# Patient Record
Sex: Female | Born: 1940 | Race: White | Hispanic: No | Marital: Married | Smoking: Former smoker
Health system: Southern US, Community
[De-identification: ages and names within clinical notes are randomized; demographics above are authoritative.]

## PROBLEM LIST (undated history)

## (undated) DIAGNOSIS — F429 Obsessive-compulsive disorder, unspecified: Secondary | ICD-10-CM

## (undated) DIAGNOSIS — R296 Repeated falls: Secondary | ICD-10-CM

## (undated) DIAGNOSIS — IMO0002 Reserved for concepts with insufficient information to code with codable children: Secondary | ICD-10-CM

## (undated) DIAGNOSIS — R079 Chest pain, unspecified: Secondary | ICD-10-CM

## (undated) DIAGNOSIS — E119 Type 2 diabetes mellitus without complications: Secondary | ICD-10-CM

## (undated) DIAGNOSIS — K635 Polyp of colon: Secondary | ICD-10-CM

## (undated) DIAGNOSIS — M1990 Unspecified osteoarthritis, unspecified site: Secondary | ICD-10-CM

## (undated) DIAGNOSIS — R413 Other amnesia: Secondary | ICD-10-CM

## (undated) DIAGNOSIS — G4733 Obstructive sleep apnea (adult) (pediatric): Secondary | ICD-10-CM

## (undated) DIAGNOSIS — F329 Major depressive disorder, single episode, unspecified: Secondary | ICD-10-CM

## (undated) DIAGNOSIS — G5600 Carpal tunnel syndrome, unspecified upper limb: Secondary | ICD-10-CM

## (undated) DIAGNOSIS — I779 Disorder of arteries and arterioles, unspecified: Secondary | ICD-10-CM

## (undated) DIAGNOSIS — F419 Anxiety disorder, unspecified: Secondary | ICD-10-CM

## (undated) DIAGNOSIS — R9430 Abnormal result of cardiovascular function study, unspecified: Secondary | ICD-10-CM

## (undated) DIAGNOSIS — I209 Angina pectoris, unspecified: Secondary | ICD-10-CM

## (undated) DIAGNOSIS — I10 Essential (primary) hypertension: Secondary | ICD-10-CM

## (undated) DIAGNOSIS — Z87442 Personal history of urinary calculi: Secondary | ICD-10-CM

## (undated) DIAGNOSIS — F32A Depression, unspecified: Secondary | ICD-10-CM

## (undated) DIAGNOSIS — K219 Gastro-esophageal reflux disease without esophagitis: Secondary | ICD-10-CM

## (undated) DIAGNOSIS — R42 Dizziness and giddiness: Secondary | ICD-10-CM

## (undated) DIAGNOSIS — I739 Peripheral vascular disease, unspecified: Secondary | ICD-10-CM

## (undated) DIAGNOSIS — E7800 Pure hypercholesterolemia, unspecified: Secondary | ICD-10-CM

## (undated) DIAGNOSIS — Z9989 Dependence on other enabling machines and devices: Secondary | ICD-10-CM

## (undated) DIAGNOSIS — R7303 Prediabetes: Secondary | ICD-10-CM

## (undated) HISTORY — DX: Disorder of arteries and arterioles, unspecified: I77.9

## (undated) HISTORY — DX: Obstructive sleep apnea (adult) (pediatric): G47.33

## (undated) HISTORY — PX: KIDNEY STONE SURGERY: SHX686

## (undated) HISTORY — PX: BREAST BIOPSY: SHX20

## (undated) HISTORY — DX: Abnormal result of cardiovascular function study, unspecified: R94.30

## (undated) HISTORY — DX: Chest pain, unspecified: R07.9

## (undated) HISTORY — DX: Dependence on other enabling machines and devices: Z99.89

## (undated) HISTORY — PX: CYSTOSCOPY: SUR368

## (undated) HISTORY — PX: OTHER SURGICAL HISTORY: SHX169

## (undated) HISTORY — DX: Peripheral vascular disease, unspecified: I73.9

## (undated) HISTORY — DX: Type 2 diabetes mellitus without complications: E11.9

## (undated) HISTORY — DX: Reserved for concepts with insufficient information to code with codable children: IMO0002

---

## 1963-03-24 HISTORY — PX: KIDNEY STONE SURGERY: SHX686

## 1992-03-23 HISTORY — PX: NASAL SEPTUM SURGERY: SHX37

## 2000-03-23 HISTORY — PX: TOE SURGERY: SHX1073

## 2003-03-24 HISTORY — PX: BREAST EXCISIONAL BIOPSY: SUR124

## 2005-03-23 HISTORY — PX: CATARACT EXTRACTION: SUR2

## 2007-04-24 ENCOUNTER — Emergency Department (HOSPITAL_COMMUNITY): Admission: EM | Admit: 2007-04-24 | Discharge: 2007-04-24 | Payer: Self-pay | Admitting: Emergency Medicine

## 2008-01-19 ENCOUNTER — Encounter (INDEPENDENT_AMBULATORY_CARE_PROVIDER_SITE_OTHER): Payer: Self-pay | Admitting: *Deleted

## 2008-01-19 ENCOUNTER — Ambulatory Visit (HOSPITAL_COMMUNITY): Admission: RE | Admit: 2008-01-19 | Discharge: 2008-01-19 | Payer: Self-pay | Admitting: *Deleted

## 2008-03-29 ENCOUNTER — Other Ambulatory Visit: Admission: RE | Admit: 2008-03-29 | Discharge: 2008-03-29 | Payer: Self-pay | Admitting: Internal Medicine

## 2009-05-15 ENCOUNTER — Encounter: Admission: RE | Admit: 2009-05-15 | Discharge: 2009-05-15 | Payer: Self-pay | Admitting: Gastroenterology

## 2010-05-08 ENCOUNTER — Other Ambulatory Visit: Payer: Self-pay | Admitting: Obstetrics and Gynecology

## 2010-08-05 NOTE — Op Note (Signed)
NAMEROGELIO, WINBUSH NO.:  [id number]   MEDICAL RECORD NO.:  [id number]          PATIENT TYPE:  AMB   LOCATION:  ENDO                         FACILITY:  [facility]   PHYSICIAN:  Georgiana Spinner, M.D.    DATE OF BIRTH:  [birth year]   DATE OF PROCEDURE:  DATE OF DISCHARGE:                               OPERATIVE REPORT   ADDENDUM:  Previously dictated operative note needs carbon copy sent to:   [facility]  [address], [address], [place]  [address]           ______________________________  Georgiana Spinner, M.D.     GMO/MEDQ  D:  01/19/2008  T:  01/19/2008  Job:  [id number]

## 2010-08-05 NOTE — Op Note (Signed)
NAMECASIA, CORTI        ACCOUNT NO.:  [id number]   MEDICAL RECORD NO.:  [id number]          PATIENT TYPE:  AMB   LOCATION:  ENDO                         FACILITY:  [facility]   PHYSICIAN:  Georgiana Spinner, M.D.    DATE OF BIRTH:  Sep 01, 1940   DATE OF PROCEDURE:  01/19/2008  DATE OF DISCHARGE:                               OPERATIVE REPORT   PROCEDURE:  Colonoscopy.   INDICATIONS:  Colon cancer screening.   ANESTHESIA:  Fentanyl 50 mcg, Versed 5 mg.   PROCEDURE IN DETAIL:  With the patient mildly sedated in the left  lateral decubitus position the Pentax videoscopic colonoscope was  inserted in the rectum and passed under direct vision to the cecum  identified by ileocecal valve and appendiceal orifice both of which were  photographed.  From this point the colonoscope was slowly withdrawn  taking circumferential views of colonic mucosa stopping at approximately  30 cm from the anal verge at which point a polyp was seen, photographed  and removed using hot biopsy forceps technique setting of 20/150 blended  current.  We next stopped just distal to this where a second polyp was  seen.  It too was photographed and it was removed using snare cautery  technique setting of 20/150 blended current as well.  Diverticulosis was  also noted.  The endoscope was withdrawn to the rectum which appeared  normal on direct and showed hemorrhoids on retroflexed view.  The  endoscope was straightened and withdrawn.  The patient's vital signs,  pulse oximeter remained stable.  The patient tolerated the procedure  well without apparent complications.   FINDINGS:  Polyps of sigmoid colon area both placed in one bottle.  Diverticulosis mild of the sigmoid area.  Internal hemorrhoids.   PLAN:  Await biopsy report.  The patient will call me for results and  follow up with me as an outpatient.           ______________________________  Georgiana Spinner, M.D.     GMO/MEDQ  D:  01/19/2008  T:   01/19/2008  Job:  [id number]

## 2011-10-06 ENCOUNTER — Other Ambulatory Visit: Payer: Self-pay | Admitting: Internal Medicine

## 2011-10-06 DIAGNOSIS — I6529 Occlusion and stenosis of unspecified carotid artery: Secondary | ICD-10-CM

## 2011-10-09 ENCOUNTER — Ambulatory Visit
Admission: RE | Admit: 2011-10-09 | Discharge: 2011-10-09 | Disposition: A | Discharge status: Home / Self Care | Payer: Medicare Other | Source: Ambulatory Visit | Attending: Internal Medicine | Admitting: Internal Medicine

## 2011-10-09 DIAGNOSIS — I6529 Occlusion and stenosis of unspecified carotid artery: Secondary | ICD-10-CM

## 2012-01-24 ENCOUNTER — Observation Stay (HOSPITAL_COMMUNITY)
Admission: EM | Admit: 2012-01-24 | Discharge: 2012-01-25 | Disposition: A | Discharge status: Home / Self Care | Payer: Medicare Other | Attending: Cardiovascular Disease | Admitting: Cardiovascular Disease

## 2012-01-24 ENCOUNTER — Emergency Department (HOSPITAL_COMMUNITY): Payer: Medicare Other

## 2012-01-24 ENCOUNTER — Other Ambulatory Visit: Payer: Self-pay

## 2012-01-24 ENCOUNTER — Encounter (HOSPITAL_COMMUNITY): Payer: Self-pay | Admitting: Emergency Medicine

## 2012-01-24 DIAGNOSIS — I779 Disorder of arteries and arterioles, unspecified: Secondary | ICD-10-CM

## 2012-01-24 DIAGNOSIS — E782 Mixed hyperlipidemia: Secondary | ICD-10-CM | POA: Insufficient documentation

## 2012-01-24 DIAGNOSIS — I6529 Occlusion and stenosis of unspecified carotid artery: Secondary | ICD-10-CM | POA: Insufficient documentation

## 2012-01-24 DIAGNOSIS — I10 Essential (primary) hypertension: Secondary | ICD-10-CM

## 2012-01-24 DIAGNOSIS — F429 Obsessive-compulsive disorder, unspecified: Secondary | ICD-10-CM | POA: Insufficient documentation

## 2012-01-24 DIAGNOSIS — E669 Obesity, unspecified: Secondary | ICD-10-CM | POA: Insufficient documentation

## 2012-01-24 DIAGNOSIS — R079 Chest pain, unspecified: Secondary | ICD-10-CM

## 2012-01-24 DIAGNOSIS — I200 Unstable angina: Principal | ICD-10-CM | POA: Insufficient documentation

## 2012-01-24 HISTORY — DX: Pure hypercholesterolemia, unspecified: E78.00

## 2012-01-24 HISTORY — DX: Polyp of colon: K63.5

## 2012-01-24 HISTORY — DX: Obsessive-compulsive disorder, unspecified: F42.9

## 2012-01-24 HISTORY — DX: Essential (primary) hypertension: I10

## 2012-01-24 HISTORY — DX: Unspecified osteoarthritis, unspecified site: M19.90

## 2012-01-24 LAB — TROPONIN I: Troponin I: <0.3 ng/mL (ref <0.30)

## 2012-01-24 LAB — BASIC METABOLIC PANEL
CO2: 30 mEq/L (ref 19–32)
Creatinine, Ser: 0.84 mg/dL (ref 0.50–1.10)
GFR calc Af Amer: 80 mL/min — ABNORMAL LOW (ref >90)
Glucose, Bld: 80 mg/dL (ref 70–99)
Sodium: 138 mEq/L (ref 135–145)

## 2012-01-24 LAB — POCT I-STAT TROPONIN I: Troponin i, poc: 0.01 ng/mL (ref 0.00–0.08)

## 2012-01-24 LAB — CBC
MCH: 33.5 pg (ref 26.0–34.0)
MCHC: 35.3 g/dL (ref 30.0–36.0)
Platelets: 128 10*3/uL — ABNORMAL LOW (ref 150–400)
RBC: 4.57 MIL/uL (ref 3.87–5.11)

## 2012-01-24 LAB — URINALYSIS, ROUTINE W REFLEX MICROSCOPIC
Bilirubin Urine: NEGATIVE
Ketones, ur: NEGATIVE mg/dL

## 2012-01-24 MED ORDER — ATORVASTATIN CALCIUM 20 MG PO TABS
20.0000 mg | ORAL_TABLET | Freq: Every day | ORAL | Status: DC
  Administered 2012-01-24 – 2012-01-25 (×2): 20 mg via ORAL
  Filled 2012-01-24 (×2): qty 1

## 2012-01-24 MED ORDER — ASPIRIN 325 MG PO TABS
325.0000 mg | ORAL_TABLET | Freq: Once | ORAL | Status: AC
  Administered 2012-01-24: 325 mg via ORAL
  Filled 2012-01-24: qty 1

## 2012-01-24 MED ORDER — SODIUM CHLORIDE 0.9 % IJ SOLN: 3.0000 mL | INTRAMUSCULAR | Status: DC | PRN

## 2012-01-24 MED ORDER — METOPROLOL TARTRATE 100 MG PO TABS
100.0000 mg | ORAL_TABLET | Freq: Two times a day (BID) | ORAL | Status: DC
  Administered 2012-01-24 – 2012-01-25 (×2): 100 mg via ORAL
  Filled 2012-01-24 (×3): qty 1

## 2012-01-24 MED ORDER — NITROGLYCERIN 0.4 MG SL SUBL: 0.4000 mg | SUBLINGUAL_TABLET | SUBLINGUAL | Status: DC | PRN

## 2012-01-24 MED ORDER — SODIUM CHLORIDE 0.9 % IV SOLN: 250.0000 mL | INTRAVENOUS | Status: DC | PRN

## 2012-01-24 MED ORDER — CITALOPRAM HYDROBROMIDE 40 MG PO TABS
40.0000 mg | ORAL_TABLET | Freq: Every day | ORAL | Status: DC
  Administered 2012-01-24 – 2012-01-25 (×2): 40 mg via ORAL
  Filled 2012-01-24 (×2): qty 1

## 2012-01-24 MED ORDER — HYDROCHLOROTHIAZIDE 12.5 MG PO CAPS
12.5000 mg | ORAL_CAPSULE | Freq: Every day | ORAL | Status: DC
  Administered 2012-01-25: 12.5 mg via ORAL
  Filled 2012-01-24 (×2): qty 1

## 2012-01-24 MED ORDER — ACETAMINOPHEN 325 MG PO TABS
650.0000 mg | ORAL_TABLET | ORAL | Status: DC | PRN
  Administered 2012-01-25: 650 mg via ORAL

## 2012-01-24 MED ORDER — PANTOPRAZOLE SODIUM 40 MG PO TBEC
40.0000 mg | DELAYED_RELEASE_TABLET | Freq: Every day | ORAL | Status: DC
  Administered 2012-01-25: 40 mg via ORAL
  Filled 2012-01-24 (×2): qty 1

## 2012-01-24 MED ORDER — ASPIRIN 81 MG PO CHEW: 324.0000 mg | CHEWABLE_TABLET | ORAL | Status: DC

## 2012-01-24 MED ORDER — ONDANSETRON HCL 4 MG/2ML IJ SOLN: 4.0000 mg | Freq: Four times a day (QID) | INTRAMUSCULAR | Status: DC | PRN

## 2012-01-24 MED ORDER — ASPIRIN EC 81 MG PO TBEC
81.0000 mg | DELAYED_RELEASE_TABLET | Freq: Every day | ORAL | Status: DC
  Administered 2012-01-24 – 2012-01-25 (×2): 81 mg via ORAL
  Filled 2012-01-24 (×2): qty 1

## 2012-01-24 MED ORDER — ZOLPIDEM TARTRATE 5 MG PO TABS: 5.0000 mg | ORAL_TABLET | Freq: Every evening | ORAL | Status: DC | PRN

## 2012-01-24 MED ORDER — SODIUM CHLORIDE 0.9 % IJ SOLN
3.0000 mL | Freq: Two times a day (BID) | INTRAMUSCULAR | Status: DC
  Administered 2012-01-24 – 2012-01-25 (×2): 3 mL via INTRAVENOUS

## 2012-01-24 MED ORDER — ADULT MULTIVITAMIN W/MINERALS CH
1.0000 | ORAL_TABLET | Freq: Every day | ORAL | Status: DC
  Administered 2012-01-25: 1 via ORAL
  Filled 2012-01-24 (×2): qty 1

## 2012-01-24 MED ORDER — REGADENOSON 0.4 MG/5ML IV SOLN
0.4000 mg | Freq: Once | INTRAVENOUS | Status: AC
  Administered 2012-01-25: 0.4 mg via INTRAVENOUS
  Filled 2012-01-24: qty 5

## 2012-01-24 MED ORDER — ASPIRIN 300 MG RE SUPP
300.0000 mg | RECTAL | Status: DC
  Filled 2012-01-24: qty 1

## 2012-01-24 NOTE — ED Provider Notes (Signed)
History     CSN: [id number]  Arrival date & time 01/24/12  1233   First MD Initiated Contact with Patient 01/24/12 1315      Chief Complaint  Patient presents with  . Chest Pain    (Consider location/radiation/quality/duration/timing/severity/associated sxs/prior treatment) The history is provided by the patient.  Tiffany Burke is a 71 y.o. female history of hypertension, high cholesterol, family history of CAD, here at residing with chest pain. She was at church around 11:15 AM this morning when she developed substernal chest pain radiating to left neck and jaw. She feels alone nauseous but denies any abdominal pain. She has history of reflux but she said this is different than her usual reflux symptoms. She also felt low short of breath but the pain is not pleuritic in nature. She got up after church and just felt "washed out". Denies any syncope. Pain lasted 45 minutes, now resolved. No hx of CAD. Stress test several years ago that was nl.    Past Medical History  Diagnosis Date  . Hypertension     a. Treated x 10-15 yrs  . Hypercholesteremia     a. Treated x 5 yrs   . OCD (obsessive compulsive disorder)   . Colon polyps     No past surgical history on file.  No family history on file.  History  Substance Use Topics  . Smoking status: Never Smoker   . Smokeless tobacco: Not on file  . Alcohol Use: Yes     Comment: occ    OB History    Grav Para Term Preterm Abortions TAB SAB Ect Mult Living                  Review of Systems  Respiratory: Positive for shortness of breath.   Cardiovascular: Positive for chest pain.  All other systems reviewed and are negative.    Allergies  Penicillins and Vancomycin  Home Medications   Current Outpatient Rx  Name  Route  Sig  Dispense  Refill  . ASPIRIN EC 81 MG PO TBEC   Oral   Take 81 mg by mouth daily.         . ATORVASTATIN CALCIUM 20 MG PO TABS   Oral   Take 20 mg by mouth daily.         Marland Kitchen  CITALOPRAM HYDROBROMIDE 40 MG PO TABS   Oral   Take 40 mg by mouth daily.         . ERGOCALCIFEROL 50000 UNITS PO CAPS   Oral   Take 50,000 Units by mouth every 14 (fourteen) days.         Marland Kitchen HYDROCHLOROTHIAZIDE 12.5 MG PO CAPS   Oral   Take 12.5 mg by mouth daily.         Marland Kitchen METOPROLOL TARTRATE 100 MG PO TABS   Oral   Take 100 mg by mouth 2 (two) times daily.         . ADULT MULTIVITAMIN W/MINERALS CH   Oral   Take 1 tablet by mouth daily.         Marland Kitchen OMEPRAZOLE 20 MG PO CPDR   Oral   Take 20 mg by mouth daily.           BP 142/57  Pulse 56  Temp 99.2 F (37.3 C) (Oral)  Resp 16  SpO2 97%  Physical Exam  Nursing note and vitals reviewed. Constitutional: She is oriented to person, place, and time. She appears well-developed and  well-nourished.  HENT:  Head: Normocephalic.  Mouth/Throat: Oropharynx is clear and moist.  Eyes: Conjunctivae normal are normal. Pupils are equal, round, and reactive to light.  Neck: Normal range of motion. Neck supple.  Cardiovascular: Normal rate, regular rhythm and normal heart sounds.   Pulmonary/Chest: Effort normal and breath sounds normal.  Abdominal: Soft. Bowel sounds are normal. She exhibits no distension. There is no tenderness.  Musculoskeletal: Normal range of motion. She exhibits no edema and no tenderness.  Neurological: She is alert and oriented to person, place, and time.  Skin: Skin is warm and dry.  Psychiatric: She has a normal mood and affect. Her behavior is normal. Judgment and thought content normal.    ED Course  Procedures (including critical care time)  Labs Reviewed  CBC - Abnormal; Notable for the following:    Hemoglobin 15.3 (*)     Platelets 128 (*)     All other components within normal limits  BASIC METABOLIC PANEL - Abnormal; Notable for the following:    GFR calc non Af Amer 69 (*)     GFR calc Af Amer 80 (*)     All other components within normal limits  POCT I-STAT TROPONIN I    TROPONIN I   Dg Chest Port 1 View  01/24/2012  *RADIOLOGY REPORT*  Clinical Data: Left-sided chest pain.  PORTABLE CHEST - 1 VIEW  Comparison: None  Findings: Borderline cardiac enlargement.  The mediastinal and hilar contours are within normal limits.  The lungs are clear.  No pleural effusion.  The bony thorax is intact.  IMPRESSION: Borderline cardiac enlargement but no acute pulmonary findings.   Original Report Authenticated By: Rudie Meyer, M.D.      1. Chest pain      Date: 01/24/2012  Rate: 52  Rhythm: sinus tachycardia  QRS Axis: normal  Intervals: normal  ST/T Wave abnormalities: TWI lead III  Conduction Disutrbances:none  Narrative Interpretation:   Old EKG Reviewed: none available    MDM  Tiffany Burke is a 72 y.o. female hx of HTN, HL here with chest pain. Possibly reflux but need to consider ACS. She is unable to run a treadmill and her BMI is 36 so can't get CT coronaries. Will need to consult cardiology for more invasive testing as she has multiple risk factors for ACS.   3:40 PM I discussed with Dr. Elita Boone from cardiology, who will see and admit the patient.         [name], MD 01/24/12 1540

## 2012-01-24 NOTE — H&P (Signed)
Patient ID: Tiffany Burke MRN: [id number], DOB/AGE: March 09, 1941   Admit date: 01/24/2012  Primary Physician: [facility], Dr. Mack Hook Primary Cardiologist: New  Pt. Profile:  71 y/o female w/o prior cardiac hx who presents to the ED w/ chest pain.  Problem List  Past Medical History  Diagnosis Date  . Hypertension     a. Treated x 10-15 yrs  . Hypercholesteremia     a. Treated x 5 yrs   . OCD (obsessive compulsive disorder)   . Colon polyps   . Nephrolithiasis     a. s/p surgical removal bilat in past - 47 yrs ago.  . Osteoarthritis     a. s/p R TKA 2003;  b. s/p L TKA 2007.  . Bilateral carotid artery stenosis     a. 09/2011 Carotid U/S: Eccentric plaque bilat carotid bulbs & origin of bilat ICA's->50-69% on right with borderline elvation of velocities on left.  Degree of narrowing appeared more severe than acquired by velocities.    Past Surgical History  Procedure Date  . Left knee replacement     b. 2007  . Right knee replacement     a. 2003    Allergies  Allergies  Allergen Reactions  . Penicillins Anaphylaxis  . Vancomycin Other (See Comments)    Reaction unknown   HPI  71 y/o female w/o prior cardiac history.  She does not exercise @ home but can usually accomplish most activities w/o experiencing DOE.  Walking up inclines however does cause DOE.  This AM, she was at church and while sitting in the pew, she developed chest heaviness that radiated to her left breast and then up to her neck and bilat jaw/teeth.  This persisted and worsened while she remained at church and so her husband drove her directly to the ED.  Upon arrival here, her discomfort had already subsided spontaneously.  Total duration was approximately 90 mins.  Here, her ECG is non-acute and initial troponin is normal.  She is currently pain free.  Home Medications  Prior to Admission medications   Medication Sig Start Date End Date Taking? Authorizing Provider    aspirin EC 81 MG tablet Take 81 mg by mouth daily.   Yes Historical Provider, MD  atorvastatin (LIPITOR) 20 MG tablet Take 20 mg by mouth daily.   Yes Historical Provider, MD  citalopram (CELEXA) 40 MG tablet Take 40 mg by mouth daily.   Yes Historical Provider, MD  ergocalciferol (VITAMIN D2) 50000 UNITS capsule Take 50,000 Units by mouth every 14 (fourteen) days.   Yes Historical Provider, MD  hydrochlorothiazide (MICROZIDE) 12.5 MG capsule Take 12.5 mg by mouth daily.   Yes Historical Provider, MD  metoprolol (LOPRESSOR) 100 MG tablet Take 100 mg by mouth 2 (two) times daily.   Yes Historical Provider, MD  Multiple Vitamin (MULTIVITAMIN WITH MINERALS) TABS Take 1 tablet by mouth daily.   Yes Historical Provider, MD  omeprazole (PRILOSEC) 20 MG capsule Take 20 mg by mouth daily.   Yes Historical Provider, MD   Family History  Family History  Problem Relation Age of Onset  . Heart attack Mother     died @ 23  . Hypertension Mother   . Stroke Father     died @ 43 - multiple strokes.  . Diabetes Brother     died @ 6 with ESRD  . Other Sister     s/p heart valve replacement  . Other Brother     alive/well @  6   Social History  History   Social History  . Marital Status: Married    Spouse Name: N/A    Number of Children: N/A  . Years of Education: N/A   Occupational History  . Not on file.   Social History Main Topics  . Smoking status: Former Smoker -- 0.5 packs/day for 32 years  . Smokeless tobacco: Not on file     Comment: quit 18 yrs ago.  . Alcohol Use: Yes     Comment: 2-3 glasses/wine per month.  . Drug Use: No  . Sexually Active: Not on file   Other Topics Concern  . 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  Lives in [place] with husband.    Review of Systems General:  No chills, fever, night sweats or weight changes.  Cardiovascular:  +++ chest pain this AM.  H/O dyspnea on exertion with  inclines.  No edema, orthopnea, palpitations, paroxysmal nocturnal dyspnea. Dermatological: No rash, lesions/masses Respiratory: No cough, dyspnea Urologic: No hematuria, dysuria Abdominal:   No nausea, vomiting, diarrhea, bright red blood per rectum, melena, or hematemesis Neurologic:  No visual changes, wkns, changes in mental status. All other systems reviewed and are otherwise negative except as noted above.  Physical Exam  Blood pressure 142/57, pulse 56, temperature 99.2 F (37.3 C), temperature source Oral, resp. rate 16, SpO2 97.00%.  General: Pleasant, NAD Psych: Normal affect. Neuro: Alert and oriented X 3. Moves all extremities spontaneously. HEENT: Normal  Neck: Supple without JVD.  Soft bruit on left. Lungs:  Resp regular and unlabored, CTA. Heart: RRR no s3, s4, or murmurs. Abdomen: Soft, non-tender, non-distended, BS + x 4.  Extremities: No clubbing, cyanosis or edema. DP/PT/Radials 2+ and equal bilaterally.  Labs  Ti: 0.01  Lab Results  Component Value Date   WBC 6.9 01/24/2012   HGB 15.3* 01/24/2012   HCT 43.3 01/24/2012   MCV 94.7 01/24/2012   PLT 128* 01/24/2012     Lab 01/24/12 1306  NA 138  K 4.5  CL 101  CO2 30  BUN 20  CREATININE 0.84  CALCIUM 10.0  PROT --  BILITOT --  ALKPHOS --  ALT --  AST --  GLUCOSE 80   Radiology/Studies  Dg Chest Port 1 View  01/24/2012  *RADIOLOGY REPORT*  Clinical Data: Left-sided chest pain.  PORTABLE CHEST - 1 VIEW  Comparison: None  Findings: Borderline cardiac enlargement.  The mediastinal and hilar contours are within normal limits.  The lungs are clear.  No pleural effusion.  The bony thorax is intact.  IMPRESSION: Borderline cardiac enlargement but no acute pulmonary findings.   Original Report Authenticated By: Rudie Meyer, M.D.    ECG  Sb, 52, ivcd, lad.  ASSESSMENT AND PLAN  1.  Chest Pain:  Pt presented after a 90 minute episode of chest discomfort that resolved spontaneously.  ECG is w/o acute st/t  changes and initial troponin is normal.  RF for CAD include obesity, htn, hl, PVD (carotid dzs), and remote tobacco abuse.  We will place in observation and cycle CE.  If CE remain normal, will plan on lexiscan myoview in the AM.  If however, she rules in, we will plan diagnostic catheterization tomorrow.  Cont asa, bb, statin.  Will not add heparin as she is pain free, but would certainly add if she rules in.  2.  HTN:  Cont home meds and adjust as necessary.  3.  HL:  Cont statin therapy.  This is followed closely as an outpt.  4.  Carotid Bruit:  Had u/s in July.  Cont asa/statin.  5.  OCD:  Cont home dose of celexa.  Signed, Nona Dell, MD, [facility] 01/24/2012, 4:19 PM   Attending note:  Patient seen and examined. Reviewed available records. She has a history of hypertension, hyperlipidemia, and bilateral carotid artery disease based on Doppler assessment. No personal history of CAD or myocardial infarction. She has a brother that had heart disease diagnosed in his 50s, although in the setting of diabetes. She presents after developing chest heaviness while seated in church, ultimately with radiation to her left side and up to her neck and lower jaw. Symptoms lasted for several minutes, ultimately abated prior to presenting to the ER via personal vehicle. She required no specific medication.  At baseline she reports intermittent shortness of breath with activity, present over the last several months. No orthopnea or PND. Has history of bilateral knee surgery, some functional limitation related to this. She reports compliance with her medications and regular followup with her primary care provider.  On examination today she is comfortable without active chest pain. Lungs are clear without labored breathing. She has a left carotid bruit. Cardiac exam reveals regular rate and rhythm without gallop. Initial troponin I normal, creatinine 0.84, hemoglobin 15.3. ECG shows no acute ST segment  abnormalities, incomplete right bundle branch block. Chest x-ray without acute findings.  Patient is being admitted for further evaluation of possible unstable angina symptoms, onset today. Risk factors are noted above including documented atherosclerosis. Cycle cardiac markers and if negative, tentatively scheduled for [facility] tomorrow as well as echocardiogram.  Jonelle Sidle, M.D., F.A.C.C.

## 2012-01-24 NOTE — ED Notes (Signed)
I gave the patient a cup of ice water. 

## 2012-01-24 NOTE — ED Notes (Signed)
Pt c/o midsternal CP with radiation to left side and up into neck and jaw starting today while in church; pt sts some SOB

## 2012-01-25 ENCOUNTER — Observation Stay (HOSPITAL_COMMUNITY): Payer: Medicare Other

## 2012-01-25 ENCOUNTER — Encounter (HOSPITAL_COMMUNITY): Payer: Self-pay | Admitting: *Deleted

## 2012-01-25 DIAGNOSIS — R072 Precordial pain: Secondary | ICD-10-CM

## 2012-01-25 DIAGNOSIS — R0609 Other forms of dyspnea: Secondary | ICD-10-CM

## 2012-01-25 DIAGNOSIS — R0989 Other specified symptoms and signs involving the circulatory and respiratory systems: Secondary | ICD-10-CM

## 2012-01-25 LAB — COMPREHENSIVE METABOLIC PANEL
AST: 40 U/L — ABNORMAL HIGH (ref 0–37)
CO2: 25 mEq/L (ref 19–32)
Calcium: 9.9 mg/dL (ref 8.4–10.5)
Creatinine, Ser: 0.66 mg/dL (ref 0.50–1.10)
GFR calc Af Amer: >90 mL/min (ref >90)
GFR calc non Af Amer: 88 mL/min — ABNORMAL LOW (ref >90)
Glucose, Bld: 103 mg/dL — ABNORMAL HIGH (ref 70–99)
Sodium: 139 mEq/L (ref 135–145)
Total Protein: 7.1 g/dL (ref 6.0–8.3)

## 2012-01-25 LAB — TSH: TSH: 2.921 u[IU]/mL (ref 0.350–4.500)

## 2012-01-25 LAB — LIPID PANEL: Cholesterol: 157 mg/dL (ref 0–200)

## 2012-01-25 LAB — TROPONIN I: Troponin I: <0.3 ng/mL (ref <0.30)

## 2012-01-25 MED ORDER — TECHNETIUM TC 99M SESTAMIBI GENERIC - CARDIOLITE
10.0000 | Freq: Once | INTRAVENOUS | Status: AC | PRN
  Administered 2012-01-25: 10 via INTRAVENOUS

## 2012-01-25 MED ORDER — TECHNETIUM TC 99M SESTAMIBI GENERIC - CARDIOLITE
30.0000 | Freq: Once | INTRAVENOUS | Status: AC | PRN
  Administered 2012-01-25: 30 via INTRAVENOUS

## 2012-01-25 MED ORDER — NITROGLYCERIN 0.4 MG SL SUBL: 0.4000 mg | SUBLINGUAL_TABLET | SUBLINGUAL | Status: DC | PRN

## 2012-01-25 MED ORDER — ACETAMINOPHEN 325 MG PO TABS
ORAL_TABLET | ORAL | Status: AC
  Filled 2012-01-25: qty 2

## 2012-01-25 NOTE — Discharge Summary (Signed)
Discharge Summary   Patient ID: Tiffany Burke,  MRN: [id number], DOB/AGE: 1940/03/25 71 y.o.  Admit date: 01/24/2012 Discharge date: 01/25/2012  Primary Physician: No primary provider on file. Primary Cardiologist:  Seen in consultation by Dr. Diona Browner, rounded by Dr. Myrtis Ser  Discharge Diagnoses Principal Problem:  *Unstable angina Active Problems:  Essential hypertension, benign  Mixed hyperlipidemia  Carotid artery disease   Allergies Allergies  Allergen Reactions  . Penicillins Anaphylaxis  . Vancomycin Other (See Comments)    Reaction unknown    Diagnostic Studies/Procedures  PORTABLE CHEST X-RAY - 01/24/12  Comparison: None  Findings: Borderline cardiac enlargement. The mediastinal and  hilar contours are within normal limits. The lungs are clear. No  pleural effusion. The bony thorax is intact.  IMPRESSION:  Borderline cardiac enlargement but no acute pulmonary findings.   2D ECHO- 01/25/12  Study Conclusions  Left ventricle: The cavity size was normal. Wall thickness was increased in a pattern of mild LVH. Systolic function was normal. The estimated ejection fraction was in the range of 55% to 60%. Wall motion was normal; there were no regional wall motion abnormalities. Doppler parameters are consistent with abnormal left ventricular relaxation (grade 1 diastolic dysfunction).   LEXISCAN MYOVIEW - 01/25/12  Normal Lexiscan Myoview with no  electrocardiographic changes. The scintigraphic results show  probable soft tissue attenuation but there is no ischemia on this  study. The gated ejection fraction was 72% and the wall motion was  normal.  History of Present Illness  Ms. Mood is a 71yo female who was admitted with the above problem list who was hospitalized on 01/24/12 for chest pain concerning for angina pectoris. Cardiac RFs include HTN, HL, PVD and remote tobacco abuse. She was in her [employer] at church the morning of admission when she  experienced chest heaviness radiating to her left breast then up to her neck and bilat jaw/teeth. This persisted and worsened while she remained at church and her husband drove her directly to the ED. There, the pain subsided (lasted approx 90 minutes). EKG and initial trop-I in the ED revealed no acute ischemic changes. CXR as above was non-acute. Given her cardiac RFs and HPI concerning for USAP, she was placed in observation.   Hospital Course   Outpatient medications were continued. Cardiac biomarkers were cycled and returned WNL. Lipid panel, as below, revealed good lipid control. TSH returned WNL. 2D echo revealed LVEF 55-60%, grade 1 diastolic dysfunction. She underwent Lexiscan Myoview revealing no evidence of ischemia. She remained stable, without acute events, and felt appropriate for discharge. She will continue her outpatient medication regimen with the addition of NTG SL for recurrent chest pain. She will follow-up in 2-4 weeks as noted below. She will follow-up with her PCP regarding alternative chest pain etiologies. This information has been clearly outlined in the discharge AVS.   Discharge Vitals:  Blood pressure 106/52, pulse 65, temperature 97.9 F (36.6 C), temperature source Oral, resp. rate 16, height 5\' 2"  (1.575 m), weight 87.862 kg (193 lb 11.2 oz), SpO2 98.00%.   Labs: Recent Labs  [facility] 01/24/12 1306   WBC 6.9   HGB 15.3*   HCT 43.3   MCV 94.7   PLT 128*    Lab 01/25/12 0525 01/24/12 1306  NA 139 138  K 4.0 4.5  CL 100 101  CO2 25 30  BUN 19 20  CREATININE 0.66 0.84  CALCIUM 9.9 10.0  PROT 7.1 --  BILITOT 0.7 --  ALKPHOS 76 --  ALT 42* --  AST 40* --  AMYLASE -- --  LIPASE -- --  GLUCOSE 103* 80   Recent Labs  Basename 01/25/12 0525 01/25/12 0026 01/24/12 1815   CKTOTAL -- -- --   CKMB -- -- --   CKMBINDEX -- -- --   TROPONINI <0.30 <0.30 <0.30   Recent Labs  Basename 01/25/12 0525   CHOL 157   HDL 57   LDLCALC 85   TRIG 77   CHOLHDL  2.8   LDLDIRECT --    Basename 01/24/12 1815  TSH 2.921  T4TOTAL --  T3FREE --  THYROIDAB --    Disposition:  Discharge Orders    Future Appointments: Provider: Department: Dept Phone: Center:   02/12/2012 12:10 PM Beatrice Lecher, PA [facility] [place]) [phone] [id number]     Follow-up Information    Follow up with Tereso Newcomer, PA. On 02/12/2012. (At 12:10 PM for follow-up after this hospitalization. )    Contact information:   [address] [place] [address] [phone]       Please follow up. (Please follow-up with your primary care doctor in 1-2 weeks. )          Discharge Medications:    Medication List     As of 01/25/2012  4:24 PM    START taking these medications         nitroGLYCERIN 0.4 MG SL tablet   Commonly known as: NITROSTAT   Place 1 tablet (0.4 mg total) under the tongue every 5 (five) minutes x 3 doses as needed for chest pain.      CONTINUE taking these medications         aspirin EC 81 MG tablet      atorvastatin 20 MG tablet   Commonly known as: LIPITOR      citalopram 40 MG tablet   Commonly known as: CELEXA      ergocalciferol 50000 UNITS capsule   Commonly known as: VITAMIN D2      hydrochlorothiazide 12.5 MG capsule   Commonly known as: MICROZIDE      metoprolol 100 MG tablet   Commonly known as: LOPRESSOR      multivitamin with minerals Tabs      omeprazole 20 MG capsule   Commonly known as: PRILOSEC          Where to get your medications    These are the prescriptions that you need to pick up. We sent them to a specific pharmacy, so you will need to go there to get them.   [id number] - [place], [place] - [address]    [address] [place] [address]    Phone: [phone]        nitroGLYCERIN 0.4 MG SL tablet           Outstanding Labs/Studies: None  Duration of Discharge Encounter: Greater than 30 minutes including physician  time.  Signed, R. Hurman Horn, PA-C 01/25/2012, 4:24 PM  I saw the patient on rounds. Please refer to my complete rounding note. The patient is stable and ready for discharge.  Jerral Bonito, MD

## 2012-01-25 NOTE — Progress Notes (Signed)
Patient ID: Tiffany Burke, female   DOB: Jan 19, 1941, 71 y.o.   MRN: [id number]   SUBJECTIVE: The patient was admitted yesterday with chest discomfort. She's been stable during the evening. She's not having any significant recurring pain. 3 troponins are all normal.   Filed Vitals:   01/24/12 1700 01/24/12 1858 01/24/12 2100 01/25/12 0500  BP: 150/71 155/84 121/68   Pulse: 64 63 56   Temp:  98.7 F (37.1 C) 98.8 F (37.1 C)   TempSrc:  Oral    Resp: 19 20 18    Weight:    193 lb 11.2 oz (87.862 kg)  SpO2: 96% 96% 95%     Intake/Output Summary (Last 24 hours) at 01/25/12 0646 Last data filed at 01/24/12 2100  Gross per 24 hour  Intake    120 ml  Output      0 ml  Net    120 ml    LABS: Basic Metabolic Panel:  Basename 01/24/12 1306  NA 138  K 4.5  CL 101  CO2 30  GLUCOSE 80  BUN 20  CREATININE 0.84  CALCIUM 10.0  MG --  PHOS --   Liver Function Tests: No results found for this basename: AST:2,ALT:2,ALKPHOS:2,BILITOT:2,PROT:2,ALBUMIN:2 in the last 72 hours No results found for this basename: LIPASE:2,AMYLASE:2 in the last 72 hours CBC:  Basename 01/24/12 1306  WBC 6.9  NEUTROABS --  HGB 15.3*  HCT 43.3  MCV 94.7  PLT 128*   Cardiac Enzymes:  Basename 01/25/12 0026 01/24/12 1815 01/24/12 1550  CKTOTAL -- -- --  CKMB -- -- --  CKMBINDEX -- -- --  TROPONINI <0.30 <0.30 <0.30   BNP: No components found with this basename: POCBNP:3 D-Dimer: No results found for this basename: DDIMER:2 in the last 72 hours Hemoglobin A1C: No results found for this basename: HGBA1C in the last 72 hours Fasting Lipid Panel: No results found for this basename: CHOL,HDL,LDLCALC,TRIG,CHOLHDL,LDLDIRECT in the last 72 hours Thyroid Function Tests:  Basename 01/24/12 1815  TSH 2.921  T4TOTAL --  T3FREE --  THYROIDAB --    RADIOLOGY: Dg Chest Port 1 View  01/24/2012  *RADIOLOGY REPORT*  Clinical Data: Left-sided chest pain.  PORTABLE CHEST - 1 VIEW  Comparison: None   Findings: Borderline cardiac enlargement.  The mediastinal and hilar contours are within normal limits.  The lungs are clear.  No pleural effusion.  The bony thorax is intact.  IMPRESSION: Borderline cardiac enlargement but no acute pulmonary findings.   Original Report Authenticated By: Rudie Meyer, M.D.     PHYSICAL EXAM  Patient is oriented to person time and place. Affect is normal. There is no jugulovenous distention. She's lying flat in bed. Lungs are clear. Respiratory effort is nonlabored. Cardiac exam reveals S1 and S2. There no clicks or significant murmurs. The abdomen is soft. There is no peripheral edema.   TELEMETRY:  I have reviewed telemetry today January 25, 2012. There is sinus rhythm. There no significant arrhythmias.   ASSESSMENT AND PLAN:  Active Problems:   Unstable angina     The patient is stable today. She is stable to proceed with a nuclear exercise test today. I've spoken with the nurses. They clarified that the nuclear scan has been ordered. This will be reviewed by the team as the day goes on to see about the next step. If the scan is normal she will be allowed to go home.   Essential hypertension, benign     Systolic pressure is slightly elevated this morning. We'll  continue to watch her pressure and adjust her meds.   Mixed hyperlipidemia  Carotid artery disease   Willa Rough 01/25/2012 6:46 AM

## 2012-01-25 NOTE — Progress Notes (Signed)
  Echocardiogram 2D Echocardiogram has been performed.  Tiffany Burke 01/25/2012, 8:50 AM

## 2012-01-25 NOTE — Progress Notes (Signed)
Patient ID: Tiffany Burke, female   DOB: 1941/02/25, 71 y.o.   MRN: [id number]   SUBJECTIVE: No pain dyspnea or palpitations "Going for nuclear study   Filed Vitals:   01/24/12 1700 01/24/12 1858 01/24/12 2100 01/25/12 0500  BP: 150/71 155/84 121/68 130/72  Pulse: 64 63 56 53  Temp:  98.7 F (37.1 C) 98.8 F (37.1 C) 98.1 F (36.7 C)  TempSrc:  Oral    Resp: 19 20 18 16   Weight:    193 lb 11.2 oz (87.862 kg)  SpO2: 96% 96% 95% 98%    Intake/Output Summary (Last 24 hours) at 01/25/12 0731 Last data filed at 01/24/12 2100  Gross per 24 hour  Intake    120 ml  Output      0 ml  Net    120 ml    LABS: Basic Metabolic Panel:  Basename 01/24/12 1306  NA 138  K 4.5  CL 101  CO2 30  GLUCOSE 80  BUN 20  CREATININE 0.84  CALCIUM 10.0  MG --  PHOS --   Liver Function Tests: No results found for this basename: AST:2,ALT:2,ALKPHOS:2,BILITOT:2,PROT:2,ALBUMIN:2 in the last 72 hours No results found for this basename: LIPASE:2,AMYLASE:2 in the last 72 hours CBC:  Basename 01/24/12 1306  WBC 6.9  NEUTROABS --  HGB 15.3*  HCT 43.3  MCV 94.7  PLT 128*   Cardiac Enzymes:  Basename 01/25/12 0525 01/25/12 0026 01/24/12 1815  CKTOTAL -- -- --  CKMB -- -- --  CKMBINDEX -- -- --  TROPONINI <0.30 <0.30 <0.30   BNP: No components found with this basename: POCBNP:3 D-Dimer: No results found for this basename: DDIMER:2 in the last 72 hours Hemoglobin A1C: No results found for this basename: HGBA1C in the last 72 hours Fasting Lipid Panel: No results found for this basename: CHOL,HDL,LDLCALC,TRIG,CHOLHDL,LDLDIRECT in the last 72 hours Thyroid Function Tests:  Basename 01/24/12 1815  TSH 2.921  T4TOTAL --  T3FREE --  THYROIDAB --    RADIOLOGY: Dg Chest Port 1 View  01/24/2012  *RADIOLOGY REPORT*  Clinical Data: Left-sided chest pain.  PORTABLE CHEST - 1 VIEW  Comparison: None  Findings: Borderline cardiac enlargement.  The mediastinal and hilar contours are  within normal limits.  The lungs are clear.  No pleural effusion.  The bony thorax is intact.  IMPRESSION: Borderline cardiac enlargement but no acute pulmonary findings.   Original Report Authenticated By: Rudie Meyer, M.D.     PHYSICAL EXAM  Patient is oriented to person time and place. Affect is normal. There is no jugulovenous distention. She's lying flat in bed. Lungs are clear. Respiratory effort is nonlabored. Cardiac exam reveals S1 and S2. There no clicks or significant murmurs. The abdomen is soft. There is no peripheral edema.   TELEMETRY:  No arrhythmia 01/25/2012    ASSESSMENT AND PLAN:  Active Problems:  Chest Pain:     R/O no ECG changes for nuclear study  D/C if normal   Essential hypertension, benign     Systolic pressure is slightly elevated this morning. We'll continue to watch her pressure and adjust her meds.   Mixed hyperlipidemia   Carotid artery disease   Charlton Haws 01/25/2012 7:31 AM

## 2012-01-25 NOTE — Progress Notes (Signed)
Utilization review completed.  

## 2012-02-04 ENCOUNTER — Other Ambulatory Visit: Payer: Self-pay | Admitting: Internal Medicine

## 2012-02-04 DIAGNOSIS — Z1231 Encounter for screening mammogram for malignant neoplasm of breast: Secondary | ICD-10-CM

## 2012-02-12 ENCOUNTER — Ambulatory Visit (INDEPENDENT_AMBULATORY_CARE_PROVIDER_SITE_OTHER): Payer: Medicare Other | Admitting: Physician Assistant

## 2012-02-12 ENCOUNTER — Encounter: Payer: Self-pay | Admitting: Physician Assistant

## 2012-02-12 VITALS — BP 152/70 | HR 57 | Ht 62.0 in | Wt 200.8 lb

## 2012-02-12 DIAGNOSIS — I10 Essential (primary) hypertension: Secondary | ICD-10-CM

## 2012-02-12 DIAGNOSIS — I779 Disorder of arteries and arterioles, unspecified: Secondary | ICD-10-CM

## 2012-02-12 DIAGNOSIS — E7800 Pure hypercholesterolemia, unspecified: Secondary | ICD-10-CM

## 2012-02-12 DIAGNOSIS — R079 Chest pain, unspecified: Secondary | ICD-10-CM

## 2012-02-12 DIAGNOSIS — Z9989 Dependence on other enabling machines and devices: Secondary | ICD-10-CM

## 2012-02-12 DIAGNOSIS — G4733 Obstructive sleep apnea (adult) (pediatric): Secondary | ICD-10-CM | POA: Insufficient documentation

## 2012-02-12 NOTE — Progress Notes (Signed)
[address], [address] [place], [place]  [address] Phone: [phone], Fax:  [phone]  Date:  02/12/2012   Name:  Tiffany Burke   DOB:  November 09, 1940   MRN:  [id number]  PCP:  Thayer Headings, MD  Primary Cardiologist:  Dr. Zackery Barefoot Primary Electrophysiologist:  None    History of Present Illness: Tiffany Burke is a 71 y.o. female who returns for follow up after recent admission to the hospital for chest pain.  She has a hx of HTN, HL, carotid stenosis, FHx of CAD.  She was admitted 11/3-11/4.  Presented with prolonged episode of chest pain.  CEs were normal.  Lexiscan MV 01/25/12:  No ischemia, EF 72%.  Echo 01/25/12: mild LVH, EF 55-60%, Gr 1 diast dysfn.  Since d/c, she is doing well.  The patient denies chest pain, shortness of breath, syncope, orthopnea, PND or significant pedal edema.   Labs (11/13):    K 4, creatinine 0.66, ALT 42, LDL 85, Hgb 15.3, TSH 2.921  Wt Readings from Last 3 Encounters:  02/12/12 200 lb 12.8 oz (91.082 kg)  01/25/12 193 lb 11.2 oz (87.862 kg)     Past Medical History  Diagnosis Date  . Hypertension     a. Treated x 10-15 yrs  . Hypercholesteremia     a. Treated x 5 yrs   . OCD (obsessive compulsive disorder)   . Colon polyps   . Nephrolithiasis     a. s/p surgical removal bilat in past - 47 yrs ago.  . Osteoarthritis     a. s/p R TKA 2003;  b. s/p L TKA 2007.  . Bilateral carotid artery stenosis     a. 09/2011 Carotid U/S: Eccentric plaque bilat carotid bulbs & origin of bilat ICA's->50-69% on right with borderline elvation of velocities on left.  Degree of narrowing appeared more severe than acquired by velocities.  Marland Kitchen Hx of cardiovascular stress test     a. admx with CP 11/13 => Lexiscan MV 01/25/12:  No ischemia, EF 72%.    Marland Kitchen Hx of echocardiogram     a. Echo 01/25/12: mild LVH, EF 55-60%, Gr 1 diast dysfn.   . OSA on CPAP     Current Outpatient Prescriptions  Medication Sig Dispense Refill  . aspirin EC  81 MG tablet Take 81 mg by mouth daily.      Marland Kitchen atorvastatin (LIPITOR) 20 MG tablet Take 20 mg by mouth daily.      . citalopram (CELEXA) 40 MG tablet Take 40 mg by mouth daily.      . ergocalciferol (VITAMIN D2) 50000 UNITS capsule Take 50,000 Units by mouth every 14 (fourteen) days.      . hydrochlorothiazide (MICROZIDE) 12.5 MG capsule Take 12.5 mg by mouth daily.      . metoprolol (LOPRESSOR) 100 MG tablet Take 100 mg by mouth 2 (two) times daily.      . Multiple Vitamin (MULTIVITAMIN WITH MINERALS) TABS Take 1 tablet by mouth daily.      . nitroGLYCERIN (NITROSTAT) 0.4 MG SL tablet Place 1 tablet (0.4 mg total) under the tongue every 5 (five) minutes x 3 doses as needed for chest pain.  25 tablet  3  . omeprazole (PRILOSEC) 20 MG capsule Take 20 mg by mouth daily.        Allergies: Allergies  Allergen Reactions  . Penicillins Anaphylaxis  . Vancomycin Other (See Comments)    Reaction unknown    Social History:  The patient  reports  that she has quit smoking. She does not have any smokeless tobacco history on file. She reports that she drinks alcohol. She reports that she does not use illicit drugs.   ROS:  Please see the history of present illness.    All other systems reviewed and negative.   PHYSICAL EXAM: VS:  BP 152/70  Pulse 57  Ht 5\' 2"  (1.575 m)  Wt 200 lb 12.8 oz (91.082 kg)  BMI 36.73 kg/m2 Well nourished, well developed, in no acute distress HEENT: normal Neck: no JVD Cardiac:  normal S1, S2; RRR; no murmur Lungs:  clear to auscultation bilaterally, no wheezing, rhonchi or rales Abd: soft, nontender, no hepatomegaly Ext: no edema Skin: warm and dry Neuro:  CNs 2-12 intact, no focal abnormalities noted  EKG:  NSR, HR 57, LAD, NSSTTW changes, no change from prior tracing      ASSESSMENT AND PLAN:  1. Chest Pain:  Resolved.  Low risk Myoview as noted.  Likely non-cardiac chest pain.  However, she has a lot of CRFs.  Recommend she continue ASA and statin.  Plan  follow up in 6 mos with Dr. Zackery Barefoot or me or sooner if she has recurrent symptoms.    2. Hypertension:  BPs typically much better at home.  Continue to monitor.  3. Hyperlipidemia:  Managed by PCP.   4. Carotid Stenosis:  Followed by PCP.  5. Sleep Apnea:  She is compliant with her CPAP.  Luna Glasgow, PA-C  12:31 PM 02/12/2012

## 2012-02-12 NOTE — Patient Instructions (Addendum)
Your physician wants you to follow-up in: 6 MONTHS WITH DR. KATZ. You will receive a reminder letter in the mail two months in advance. If you don't receive a letter, please call our office to schedule the follow-up appointment.   NO CHANGES WERE MADE TODAY

## 2012-03-11 ENCOUNTER — Ambulatory Visit
Admission: RE | Admit: 2012-03-11 | Discharge: 2012-03-11 | Disposition: A | Discharge status: Home / Self Care | Payer: Medicare Other | Source: Ambulatory Visit | Attending: Internal Medicine | Admitting: Internal Medicine

## 2012-03-11 DIAGNOSIS — Z1231 Encounter for screening mammogram for malignant neoplasm of breast: Secondary | ICD-10-CM

## 2012-07-26 ENCOUNTER — Encounter: Payer: Self-pay | Admitting: Cardiology

## 2012-07-26 DIAGNOSIS — M1990 Unspecified osteoarthritis, unspecified site: Secondary | ICD-10-CM | POA: Insufficient documentation

## 2012-07-26 DIAGNOSIS — E7800 Pure hypercholesterolemia, unspecified: Secondary | ICD-10-CM | POA: Insufficient documentation

## 2012-07-26 DIAGNOSIS — I779 Disorder of arteries and arterioles, unspecified: Secondary | ICD-10-CM | POA: Insufficient documentation

## 2012-07-26 DIAGNOSIS — R079 Chest pain, unspecified: Secondary | ICD-10-CM | POA: Insufficient documentation

## 2012-07-26 DIAGNOSIS — N200 Calculus of kidney: Secondary | ICD-10-CM | POA: Insufficient documentation

## 2012-07-26 DIAGNOSIS — I739 Peripheral vascular disease, unspecified: Secondary | ICD-10-CM

## 2012-07-26 DIAGNOSIS — R9430 Abnormal result of cardiovascular function study, unspecified: Secondary | ICD-10-CM | POA: Insufficient documentation

## 2012-07-26 DIAGNOSIS — K635 Polyp of colon: Secondary | ICD-10-CM | POA: Insufficient documentation

## 2012-07-28 ENCOUNTER — Ambulatory Visit (INDEPENDENT_AMBULATORY_CARE_PROVIDER_SITE_OTHER): Payer: Medicare Other | Admitting: Cardiology

## 2012-07-28 ENCOUNTER — Encounter: Payer: Self-pay | Admitting: Cardiology

## 2012-07-28 VITALS — BP 132/90 | HR 59 | Ht 62.0 in | Wt 204.8 lb

## 2012-07-28 DIAGNOSIS — I779 Disorder of arteries and arterioles, unspecified: Secondary | ICD-10-CM

## 2012-07-28 DIAGNOSIS — R079 Chest pain, unspecified: Secondary | ICD-10-CM

## 2012-07-28 NOTE — Patient Instructions (Addendum)
**Note De-identified  Obfuscation** Your physician recommends that you continue on your current medications as directed. Please refer to the Current Medication list given to you today.  Your physician recommends that you schedule a follow-up appointment in: as needed  

## 2012-07-28 NOTE — Assessment & Plan Note (Signed)
She's had no recurrent chest pain. No further workup is needed. I will see her on a when necessary basis if she has problems earlier from her primary physician.

## 2012-07-28 NOTE — Progress Notes (Signed)
HPI  Patient is seen to followup some chest pain. She had been embedded to the hospital with chest discomfort November, 2013. Nuclear scan revealed no ischemia. She's done well since then and has not had a recurrent problems. She had a post hospital visit with Mr. Alben Spittle in November, 2013. She is now here for one final followup. She's not having any significant problems.  Allergies  Allergen Reactions  . Penicillins Anaphylaxis  . Vancomycin Other (See Comments)    Reaction unknown    Current Outpatient Prescriptions  Medication Sig Dispense Refill  . aspirin EC 81 MG tablet Take 81 mg by mouth daily.      . citalopram (CELEXA) 40 MG tablet Take 40 mg by mouth daily.      . ergocalciferol (VITAMIN D2) 50000 UNITS capsule Take 50,000 Units by mouth every 14 (fourteen) days.      . hydrochlorothiazide (MICROZIDE) 12.5 MG capsule Take 12.5 mg by mouth daily.      . metoprolol (LOPRESSOR) 100 MG tablet Take 100 mg by mouth 2 (two) times daily.      . Multiple Vitamin (MULTIVITAMIN WITH MINERALS) TABS Take 1 tablet by mouth daily.      . nitroGLYCERIN (NITROSTAT) 0.4 MG SL tablet Place 1 tablet (0.4 mg total) under the tongue every 5 (five) minutes x 3 doses as needed for chest pain.  25 tablet  3  . omeprazole (PRILOSEC) 20 MG capsule Take 20 mg by mouth daily.      Marland Kitchen atorvastatin (LIPITOR) 20 MG tablet Take 20 mg by mouth daily.       No current facility-administered medications for this visit.    History   Social History  . Marital Status: Married    Spouse Name: N/A    Number of Children: N/A  . Years of Education: N/A   Occupational History  . Not on file.   Social History Main Topics  . Smoking status: Former Smoker -- 0.50 packs/day for 32 years  . Smokeless tobacco: Not on file     Comment: quit 18 yrs ago.  . Alcohol Use: Yes     Comment: 2-3 glasses/wine per month.  . Drug Use: No  . Sexually Active: Not on file   Other Topics Concern  . 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  Lives in [place] with husband.    Family History  Problem Relation Age of Onset  . Heart attack Mother     died @ 48  . Hypertension Mother   . Stroke Father     died @ 87 - multiple strokes.  . Diabetes Brother     died @ 74 with ESRD  . Other Sister     s/p heart valve replacement  . Other Brother     alive/well @ 53    Past Medical History  Diagnosis Date  . Hypertension     a. Treated x 10-15 yrs  . Hypercholesteremia     a. Treated x 5 yrs   . OCD (obsessive compulsive disorder)   . Colon polyps   . Nephrolithiasis     a. s/p surgical removal bilat in past - 47 yrs ago.  . Osteoarthritis     a. s/p R TKA 2003;  b. s/p L TKA 2007.  . Carotid artery disease     a. 09/2011 Carotid U/S: Eccentric plaque bilat carotid bulbs & origin of bilat ICA's->50-69%  on right with borderline elvation of velocities on left.  Degree of narrowing appeared more severe than acquired by velocities.  . Chest pain     a. admx with CP 11/13 => Lexiscan MV 01/25/12:  No ischemia, EF 72%.    . Ejection Fraction     a. Echo 01/25/12: mild LVH, EF 55-60%, Gr 1 diast dysfn.   . OSA on CPAP     Past Surgical History  Procedure Laterality Date  . Left knee replacement      b. 2007  . Right knee replacement      a. 2003    Patient Active Problem List   Diagnosis Date Noted  . Hypercholesteremia   . Carotid artery disease   . Colon polyps   . Nephrolithiasis   . Osteoarthritis   . Chest pain   . Ejection Fraction   . OSA on CPAP   . Essential hypertension, benign 01/24/2012  . OCD (obsessive compulsive disorder)     ROS   Patient denies fever, chills, headache, sweats, rash, change in vision, change in hearing, chest pain, cough, nausea vomiting, urinary symptoms. All other systems are reviewed and are negative.  PHYSICAL EXAM  Patient is oriented to person time and place. Affect is normal. Lungs  are clear. Respiratory effort is nonlabored. Cardiac exam reveals S1 and S2. There no clicks or significant murmurs. The abdomen is soft. There is no peripheral edema.  Filed Vitals:   07/28/12 1145  BP: 132/90  Pulse: 59  Height: 5\' 2"  (1.575 m)  Weight: 204 lb 12.8 oz (92.897 kg)  SpO2: 94%     ASSESSMENT & PLAN

## 2012-07-28 NOTE — Assessment & Plan Note (Signed)
Her carotid disease is followed carefully by her primary team.

## 2012-10-04 ENCOUNTER — Other Ambulatory Visit: Payer: Self-pay | Admitting: Gastroenterology

## 2012-10-04 DIAGNOSIS — R4702 Dysphasia: Secondary | ICD-10-CM

## 2012-10-06 ENCOUNTER — Ambulatory Visit
Admission: RE | Admit: 2012-10-06 | Discharge: 2012-10-06 | Disposition: A | Discharge status: Home / Self Care | Payer: Medicare Other | Source: Ambulatory Visit | Attending: Gastroenterology | Admitting: Gastroenterology

## 2012-10-06 DIAGNOSIS — R4702 Dysphasia: Secondary | ICD-10-CM

## 2012-12-12 ENCOUNTER — Other Ambulatory Visit: Payer: Self-pay | Admitting: Gastroenterology

## 2013-02-02 ENCOUNTER — Other Ambulatory Visit: Payer: Self-pay | Admitting: Internal Medicine

## 2013-02-02 DIAGNOSIS — R1011 Right upper quadrant pain: Secondary | ICD-10-CM

## 2013-02-02 DIAGNOSIS — R7401 Elevation of levels of liver transaminase levels: Secondary | ICD-10-CM

## 2013-02-07 ENCOUNTER — Other Ambulatory Visit: Payer: Self-pay

## 2013-02-07 DIAGNOSIS — Z1231 Encounter for screening mammogram for malignant neoplasm of breast: Secondary | ICD-10-CM

## 2013-02-09 ENCOUNTER — Ambulatory Visit
Admission: RE | Admit: 2013-02-09 | Discharge: 2013-02-09 | Disposition: A | Discharge status: Home / Self Care | Payer: Medicare Other | Source: Ambulatory Visit | Attending: Internal Medicine | Admitting: Internal Medicine

## 2013-02-09 DIAGNOSIS — R1011 Right upper quadrant pain: Secondary | ICD-10-CM

## 2013-02-09 DIAGNOSIS — R7401 Elevation of levels of liver transaminase levels: Secondary | ICD-10-CM

## 2013-03-13 ENCOUNTER — Ambulatory Visit
Admission: RE | Admit: 2013-03-13 | Discharge: 2013-03-13 | Disposition: A | Discharge status: Home / Self Care | Payer: BC Managed Care – PPO | Source: Ambulatory Visit

## 2013-03-13 DIAGNOSIS — Z1231 Encounter for screening mammogram for malignant neoplasm of breast: Secondary | ICD-10-CM

## 2013-03-27 ENCOUNTER — Other Ambulatory Visit: Payer: Self-pay | Admitting: Internal Medicine

## 2013-03-27 DIAGNOSIS — R928 Other abnormal and inconclusive findings on diagnostic imaging of breast: Secondary | ICD-10-CM

## 2013-03-30 ENCOUNTER — Ambulatory Visit
Admission: RE | Admit: 2013-03-30 | Discharge: 2013-03-30 | Disposition: A | Discharge status: Home / Self Care | Payer: Medicare Other | Source: Ambulatory Visit | Attending: Internal Medicine | Admitting: Internal Medicine

## 2013-03-30 DIAGNOSIS — R928 Other abnormal and inconclusive findings on diagnostic imaging of breast: Secondary | ICD-10-CM

## 2013-08-02 ENCOUNTER — Other Ambulatory Visit (HOSPITAL_COMMUNITY): Payer: Self-pay | Admitting: Physician Assistant

## 2013-09-20 ENCOUNTER — Other Ambulatory Visit: Payer: Self-pay | Admitting: Obstetrics and Gynecology

## 2013-09-21 LAB — CYTOLOGY - PAP

## 2013-12-14 ENCOUNTER — Other Ambulatory Visit (HOSPITAL_COMMUNITY): Payer: Self-pay | Admitting: Orthopedic Surgery

## 2013-12-14 DIAGNOSIS — M2341 Loose body in knee, right knee: Secondary | ICD-10-CM

## 2013-12-27 ENCOUNTER — Encounter (HOSPITAL_COMMUNITY)
Admission: RE | Admit: 2013-12-27 | Discharge: 2013-12-27 | Disposition: A | Discharge status: Home / Self Care | Payer: Medicare Other | Source: Ambulatory Visit | Attending: Diagnostic Radiology | Admitting: Diagnostic Radiology

## 2013-12-27 ENCOUNTER — Encounter (HOSPITAL_COMMUNITY): Payer: Self-pay

## 2013-12-27 ENCOUNTER — Encounter (HOSPITAL_COMMUNITY)
Admission: RE | Admit: 2013-12-27 | Discharge: 2013-12-27 | Disposition: A | Discharge status: Home / Self Care | Payer: Medicare Other | Source: Ambulatory Visit | Attending: Orthopedic Surgery | Admitting: Orthopedic Surgery

## 2013-12-27 DIAGNOSIS — G8918 Other acute postprocedural pain: Secondary | ICD-10-CM | POA: Diagnosis not present

## 2013-12-27 DIAGNOSIS — Y838 Other surgical procedures as the cause of abnormal reaction of the patient, or of later complication, without mention of misadventure at the time of the procedure: Secondary | ICD-10-CM | POA: Diagnosis not present

## 2013-12-27 DIAGNOSIS — T8484XA Pain due to internal orthopedic prosthetic devices, implants and grafts, initial encounter: Secondary | ICD-10-CM | POA: Insufficient documentation

## 2013-12-27 DIAGNOSIS — Z96653 Presence of artificial knee joint, bilateral: Secondary | ICD-10-CM | POA: Diagnosis not present

## 2013-12-27 DIAGNOSIS — M2341 Loose body in knee, right knee: Secondary | ICD-10-CM | POA: Diagnosis present

## 2013-12-27 MED ORDER — TECHNETIUM TC 99M MEDRONATE IV KIT
25.0000 | PACK | Freq: Once | INTRAVENOUS | Status: AC | PRN
  Administered 2013-12-27: 25 via INTRAVENOUS

## 2014-03-01 ENCOUNTER — Ambulatory Visit: Payer: Self-pay | Admitting: Orthopedic Surgery

## 2014-03-01 NOTE — Progress Notes (Signed)
Preoperative surgical orders have been place into the Epic hospital system for Tiffany Burke on 03/01/2014, 6:04 PM  by Patrica DuelPERKINS, Shantee Hayne for surgery on 03-28-2014.  Preop Total Knee orders including Experal, IV Tylenol, and IV Decadron as long as there are no contraindications to the above medications. Avel Peacerew Zoriyah Scheidegger, PA-C

## 2014-03-14 NOTE — Patient Instructions (Addendum)
Tiffany Burke  03/14/2014   Your procedure is scheduled on: Wednesday 03/28/14   Report to [facility]  Entrance and follow signs to               Short Stay Center at 10:30 AM.  Call this number if you have problems the morning of surgery [phone]   Remember:  Do not eat food or drink liquids :After Midnight.    Take these medicines the morning of surgery with A SIP OF WATER: citalopram, metoprolol, prilosec                               You may not have any metal on your body including hair pins and              piercings  Do not wear jewelry, make-up, lotions, powders or perfumes.             Do not wear nail polish.  Do not shave  48 hours prior to surgery.              Men may shave face and neck.  Do not bring valuables to the hospital. [facility] IS NOT             RESPONSIBLE   FOR VALUABLES.  Contacts, dentures or bridgework may not be worn into surgery.  Leave suitcase in the car. After surgery it may be brought to your room.               Please read over the following fact sheets you were given: MRSA information _____________________________________________________________________             [facility] - Preparing for Surgery Before surgery, you can play an important role.  Because skin is not sterile, your skin needs to be as free of germs as possible.  You can reduce the number of germs on your skin by washing with CHG (chlorahexidine gluconate) soap before surgery.  CHG is an antiseptic cleaner which kills germs and bonds with the skin to continue killing germs even after washing. Please DO NOT use if you have an allergy to CHG or antibacterial soaps.  If your skin becomes reddened/irritated stop using the CHG and inform your nurse when you arrive at Short Stay. Do not shave (including legs and underarms) for at least 48 hours prior to the first CHG shower.  You may shave your face/neck. Please follow these instructions  carefully:  1.  Shower with CHG Soap the night before surgery and the  morning of Surgery.  2.  If you choose to wash your hair, wash your hair first as usual with your  normal  shampoo.  3.  After you shampoo, rinse your hair and body thoroughly to remove the  shampoo.                            4.  Use CHG as you would any other liquid soap.  You can apply chg directly  to the skin and wash                       Gently with a scrungie or clean washcloth.  5.  Apply the CHG Soap to your body ONLY FROM THE NECK DOWN.   Do not use  on face/ open                           Wound or open sores. Avoid contact with eyes, ears mouth and genitals (private parts).                       Wash face,  Genitals (private parts) with your normal soap.             6.  Wash thoroughly, paying special attention to the area where your surgery  will be performed.  7.  Thoroughly rinse your body with warm water from the neck down.  8.  DO NOT shower/wash with your normal soap after using and rinsing off  the CHG Soap.                9.  Pat yourself dry with a clean towel.            10.  Wear clean pajamas.            11.  Place clean sheets on your bed the night of your first shower and do not  sleep with pets. Day of Surgery : Do not apply any lotions/deodorants the morning of surgery.  Please wear clean clothes to the hospital/surgery center.  FAILURE TO FOLLOW THESE INSTRUCTIONS MAY RESULT IN THE CANCELLATION OF YOUR SURGERY PATIENT SIGNATURE_________________________________  NURSE SIGNATURE__________________________________  ________________________________________________________________________  WHAT IS A BLOOD TRANSFUSION? Blood Transfusion Information  A transfusion is the replacement of blood or some of its parts. Blood is made up of multiple cells which provide different functions.  Red blood cells carry oxygen and are used for blood loss replacement.  White blood cells fight against  infection.  Platelets control bleeding.  Plasma helps clot blood.  Other blood products are available for specialized needs, such as hemophilia or other clotting disorders. BEFORE THE TRANSFUSION  Who gives blood for transfusions?   Healthy volunteers who are fully evaluated to make sure their blood is safe. This is blood bank blood. Transfusion therapy is the safest it has ever been in the practice of medicine. Before blood is taken from a donor, a complete history is taken to make sure that person has no history of diseases nor engages in risky social behavior (examples are intravenous drug use or sexual activity with multiple partners). The donor's travel history is screened to minimize risk of transmitting infections, such as malaria. The donated blood is tested for signs of infectious diseases, such as HIV and hepatitis. The blood is then tested to be sure it is compatible with you in order to minimize the chance of a transfusion reaction. If you or a relative donates blood, this is often done in anticipation of surgery and is not appropriate for emergency situations. It takes many days to process the donated blood. RISKS AND COMPLICATIONS Although transfusion therapy is very safe and saves many lives, the main dangers of transfusion include:  1. Getting an infectious disease. 2. Developing a transfusion reaction. This is an allergic reaction to something in the blood you were given. Every precaution is taken to prevent this. The decision to have a blood transfusion has been considered carefully by your caregiver before blood is given. Blood is not given unless the benefits outweigh the risks. AFTER THE TRANSFUSION  Right after receiving a blood transfusion, you will usually feel much better and more energetic. This is especially true if your  red blood cells have gotten low (anemic). The transfusion raises the level of the red blood cells which carry oxygen, and this usually causes an energy  increase.  The nurse administering the transfusion will monitor you carefully for complications. HOME CARE INSTRUCTIONS  No special instructions are needed after a transfusion. You may find your energy is better. Speak with your caregiver about any limitations on activity for underlying diseases you may have. SEEK MEDICAL CARE IF:   Your condition is not improving after your transfusion.  You develop redness or irritation at the intravenous (IV) site. SEEK IMMEDIATE MEDICAL CARE IF:  Any of the following symptoms occur over the next 12 hours:  Shaking chills.  You have a temperature by mouth above 102 F (38.9 C), not controlled by medicine.  Chest, back, or muscle pain.  People around you feel you are not acting correctly or are confused.  Shortness of breath or difficulty breathing.  Dizziness and fainting.  You get a rash or develop hives.  You have a decrease in urine output.  Your urine turns a dark color or changes to pink, red, or brown. Any of the following symptoms occur over the next 10 days:  You have a temperature by mouth above 102 F (38.9 C), not controlled by medicine.  Shortness of breath.  Weakness after normal activity.  The white part of the eye turns yellow (jaundice).  You have a decrease in the amount of urine or are urinating less often.  Your urine turns a dark color or changes to pink, red, or brown. Document Released: 03/06/2000 Document Revised: 06/01/2011 Document Reviewed: 10/24/2007 [employer] Patient Information 2014 [place].  _______________________________________________________________________  Incentive Spirometer  An incentive spirometer is a tool that can help keep your lungs clear and active. This tool measures how well you are filling your lungs with each breath. Taking long deep breaths may help reverse or decrease the chance of developing breathing (pulmonary) problems (especially infection) following:  A long  period of time when you are unable to move or be active. BEFORE THE PROCEDURE   If the spirometer includes an indicator to show your best effort, your nurse or respiratory therapist will set it to a desired goal.  If possible, sit up straight or lean slightly forward. Try not to slouch.  Hold the incentive spirometer in an upright position. INSTRUCTIONS FOR USE  3. Sit on the edge of your bed if possible, or sit up as far as you can in bed or on a chair. 4. Hold the incentive spirometer in an upright position. 5. Breathe out normally. 6. Place the mouthpiece in your mouth and seal your lips tightly around it. 7. Breathe in slowly and as deeply as possible, raising the piston or the ball toward the top of the column. 8. Hold your breath for 3-5 seconds or for as long as possible. Allow the piston or ball to fall to the bottom of the column. 9. Remove the mouthpiece from your mouth and breathe out normally. 10. Rest for a few seconds and repeat Steps 1 through 7 at least 10 times every 1-2 hours when you are awake. Take your time and take a few normal breaths between deep breaths. 11. The spirometer may include an indicator to show your best effort. Use the indicator as a goal to work toward during each repetition. 12. After each set of 10 deep breaths, practice coughing to be sure your lungs are clear. If you have an incision (the  cut made at the time of surgery), support your incision when coughing by placing a pillow or rolled up towels firmly against it. Once you are able to get out of bed, walk around indoors and cough well. You may stop using the incentive spirometer when instructed by your caregiver.  RISKS AND COMPLICATIONS  Take your time so you do not get dizzy or light-headed.  If you are in pain, you may need to take or ask for pain medication before doing incentive spirometry. It is harder to take a deep breath if you are having pain. AFTER USE  Rest and breathe slowly and  easily.  It can be helpful to keep track of a log of your progress. Your caregiver can provide you with a simple table to help with this. If you are using the spirometer at home, follow these instructions: [place] IF:   You are having difficultly using the spirometer.  You have trouble using the spirometer as often as instructed.  Your pain medication is not giving enough relief while using the spirometer.  You develop fever of 100.5 F (38.1 C) or higher. SEEK IMMEDIATE MEDICAL CARE IF:   You cough up bloody sputum that had not been present before.  You develop fever of 102 F (38.9 C) or greater.  You develop worsening pain at or near the incision site. MAKE SURE YOU:   Understand these instructions.  Will watch your condition.  Will get help right away if you are not doing well or get worse. Document Released: 07/20/2006 Document Revised: 06/01/2011 Document Reviewed: 09/20/2006 [facility] Patient Information 2014 [place], [place].   ________________________________________________________________________

## 2014-03-20 ENCOUNTER — Encounter (HOSPITAL_COMMUNITY): Payer: Self-pay

## 2014-03-20 ENCOUNTER — Encounter (HOSPITAL_COMMUNITY)
Admission: RE | Admit: 2014-03-20 | Discharge: 2014-03-20 | Disposition: A | Discharge status: Home / Self Care | Payer: Medicare Other | Source: Ambulatory Visit | Attending: Orthopedic Surgery | Admitting: Orthopedic Surgery

## 2014-03-20 ENCOUNTER — Other Ambulatory Visit: Payer: Self-pay

## 2014-03-20 DIAGNOSIS — Z01818 Encounter for other preprocedural examination: Secondary | ICD-10-CM | POA: Insufficient documentation

## 2014-03-20 DIAGNOSIS — I10 Essential (primary) hypertension: Secondary | ICD-10-CM

## 2014-03-20 HISTORY — DX: Prediabetes: R73.03

## 2014-03-20 HISTORY — DX: Major depressive disorder, single episode, unspecified: F32.9

## 2014-03-20 HISTORY — DX: Personal history of urinary calculi: Z87.442

## 2014-03-20 HISTORY — DX: Angina pectoris, unspecified: I20.9

## 2014-03-20 HISTORY — DX: Anxiety disorder, unspecified: F41.9

## 2014-03-20 HISTORY — DX: Gastro-esophageal reflux disease without esophagitis: K21.9

## 2014-03-20 HISTORY — DX: Depression, unspecified: F32.A

## 2014-03-20 LAB — URINALYSIS, ROUTINE W REFLEX MICROSCOPIC
Bilirubin Urine: NEGATIVE
Glucose, UA: NEGATIVE mg/dL
Hgb urine dipstick: NEGATIVE
KETONES UR: NEGATIVE mg/dL
NITRITE: NEGATIVE
Protein, ur: NEGATIVE mg/dL
SPECIFIC GRAVITY, URINE: 1.014 (ref 1.005–1.030)
Urobilinogen, UA: 0.2 mg/dL (ref 0.0–1.0)
pH: 5.5 (ref 5.0–8.0)

## 2014-03-20 LAB — PROTIME-INR
INR: 1.09 (ref 0.00–1.49)
PROTHROMBIN TIME: 14.2 s (ref 11.6–15.2)

## 2014-03-20 LAB — URINE MICROSCOPIC-ADD ON

## 2014-03-20 LAB — APTT: aPTT: 34 seconds (ref 24–37)

## 2014-03-20 LAB — CBC
HEMATOCRIT: 42.7 % (ref 36.0–46.0)
Hemoglobin: 14.4 g/dL (ref 12.0–15.0)
MCH: 32.6 pg (ref 26.0–34.0)
MCHC: 33.7 g/dL (ref 30.0–36.0)
MCV: 96.6 fL (ref 78.0–100.0)
Platelets: 140 10*3/uL — ABNORMAL LOW (ref 150–400)
RBC: 4.42 MIL/uL (ref 3.87–5.11)
RDW: 12.3 % (ref 11.5–15.5)
WBC: 4.8 10*3/uL (ref 4.0–10.5)

## 2014-03-20 LAB — SURGICAL PCR SCREEN
MRSA, PCR: NEGATIVE
Staphylococcus aureus: NEGATIVE

## 2014-03-20 NOTE — Progress Notes (Signed)
Lab results 03/13/14 on chart, surgery clearance note 03/20/14 Dr. Thea SilversmithMackenzie on chart

## 2014-03-21 NOTE — H&P (Signed)
TOTAL KNEE REVISION ADMISSION H&P  Patient is being admitted for right revision total knee arthroplasty vs right knee polyethylene revision with grafting.  Subjective:  Chief Complaint:right knee pain.  HPI: Tiffany Burke, 73 y.o. female, has a history of pain and functional disability in the right knee(s) due to failed previous arthroplasty and patient has failed non-surgical conservative treatments for greater than 12 weeks to include NSAID's and/or analgesics, use of assistive devices and activity modification. The indications for the revision of the total knee arthroplasty are lytic cyst formation surrounding the medial femur. Onset of symptoms was gradual starting 7 years ago with gradually worsening course in the last 2 months.  Prior procedures on the right knee(s) include arthroplasty.  Patient currently rates pain in the right knee(s) at 8 out of 10 with activity. There is night pain, worsening of pain with activity and weight bearing, pain that interferes with activities of daily living, pain with passive range of motion and joint swelling.  Patient has evidence of subchondral cysts but no signs of component loosening based on bone scan. This condition presents safety issues increasing the risk of falls. There is no current active infection.  Patient Active Problem List   Diagnosis Date Noted  . Hypercholesteremia   . Carotid artery disease   . Colon polyps   . Nephrolithiasis   . Osteoarthritis   . Chest pain   . Ejection Fraction   . OSA on CPAP   . Essential hypertension, benign 01/24/2012  . OCD (obsessive compulsive disorder)    Past Medical History  Diagnosis Date  . Hypertension     a. Treated x 10-15 yrs  . Hypercholesteremia     a. Treated x 5 yrs   . OCD (obsessive compulsive disorder)   . Colon polyps   . Osteoarthritis     a. s/p R TKA 2003;  b. s/p L TKA 2007.  . Carotid artery disease     a. 09/2011 Carotid U/S: Eccentric plaque bilat carotid bulbs &  origin of bilat ICA's->50-69% on right with borderline elvation of velocities on left.  Degree of narrowing appeared more severe than acquired by velocities.  . Chest pain     a. admx with CP 11/13 => Lexiscan MV 01/25/12:  No ischemia, EF 72%.    . Ejection Fraction     a. Echo 01/25/12: mild LVH, EF 55-60%, Gr 1 diast dysfn.   . OSA on CPAP   . Anginal pain     "went to ED-complete work up and everything was fine"  . Borderline diabetes   . Anxiety   . Depression   . History of kidney stones     none since age 76  . GERD (gastroesophageal reflux disease)     Past Surgical History  Procedure Laterality Date  . Left knee replacement      b. 2007  . Right knee replacement      a. 2003  . Cystoscopy      "several"  . Kidney stone surgery      x1  . Nasal septum surgery  1994  . Toe surgery Left 2002    big toe  . Cataract extraction Bilateral 2007     Current outpatient prescriptions:  acetaminophen (TYLENOL) 325 MG tablet, Take 650 mg by mouth every 6 (six) hours as needed., Disp: , Rfl: ;   atorvastatin (LIPITOR) 40 MG tablet, Take 40 mg by mouth at bedtime., Disp: , Rfl: ;   citalopram (CELEXA) 40  MG tablet, Take 40 mg by mouth every morning. , Disp: , Rfl: ;   clobetasol (TEMOVATE) 0.05 % external solution, Apply 1 application topically as needed. , Disp: , Rfl:  diphenhydrAMINE (BENADRYL) 25 MG tablet, Take 25 mg by mouth daily as needed for allergies (in the am)., Disp: , Rfl: ;   ergocalciferol (VITAMIN D2) 50000 UNITS capsule, Take 50,000 Units by mouth every 14 (fourteen) days., Disp: , Rfl: ;   hydrochlorothiazide (MICROZIDE) 12.5 MG capsule, Take 12.5 mg by mouth every morning. , Disp: , Rfl: ;   metoprolol (LOPRESSOR) 100 MG tablet, Take 100 mg by mouth 2 (two) times daily., Disp: , Rfl:  Multiple Vitamin (MULTIVITAMIN WITH MINERALS) TABS, Take 1 tablet by mouth every morning. , Disp: , Rfl: ;   naproxen sodium (ANAPROX) 220 MG tablet, Take 220 mg by mouth 2 (two)  times daily., Disp: , Rfl: ;   omeprazole (PRILOSEC) 20 MG capsule, Take 20 mg by mouth every morning. , Disp: , Rfl: ;   Probiotic Product (PROBIOTIC DAILY PO), Take 1 tablet by mouth every morning., Disp: , Rfl:  aspirin 81 MG tablet, Take 81 mg by mouth at bedtime., Disp: , Rfl: ;   NITROSTAT 0.4 MG SL tablet, PLACE 1 TABLET UNDER THE TONGUE EVERY 5 MINUTES X 3 DOSES AS NEEDED FOR CHEST PAIN, Disp: 25 tablet, Rfl: 2  Allergies  Allergen Reactions  . Penicillins Anaphylaxis  . Vancomycin Other (See Comments)    Reaction unknown    History  Substance Use Topics  . Smoking status: Former Smoker -- 0.50 packs/day for 32 years    Types: Cigarettes    Quit date: 03/23/1992  . Smokeless tobacco: Never Used  . Alcohol Use: Yes     Comment: Glass of wine occasionally    Family History  Problem Relation Age of Onset  . Heart attack Mother     died @ 3984  . Hypertension Mother   . Stroke Father     died @ 1080 - multiple strokes.  . Diabetes Brother     died @ 6751 with ESRD  . Other Sister     s/p heart valve replacement  . Other Brother     alive/well @ 84      Review of Systems  Constitutional: Negative for fever, chills, weight loss, malaise/fatigue and diaphoresis.  HENT: Positive for hearing loss. Negative for congestion, ear discharge, ear pain, nosebleeds, sore throat and tinnitus.   Eyes: Negative.   Respiratory: Negative.  Negative for stridor.   Cardiovascular: Negative.   Gastrointestinal: Negative.   Genitourinary: Negative.   Musculoskeletal: Positive for back pain, joint pain and neck pain. Negative for myalgias and falls.       Right knee pain  Skin: Negative.   Neurological: Positive for weakness. Negative for dizziness, tingling, tremors, sensory change, speech change, focal weakness, seizures, loss of consciousness and headaches.  Endo/Heme/Allergies: Positive for environmental allergies.  Psychiatric/Behavioral: Negative.      Objective:  Physical Exam   Constitutional: She is oriented to person, place, and time. She appears well-developed and well-nourished. No distress.  HENT:  Head: Normocephalic and atraumatic.  Right Ear: External ear normal.  Left Ear: External ear normal.  Nose: Nose normal.  Mouth/Throat: Oropharynx is clear and moist.  Eyes: Conjunctivae and EOM are normal.  Neck: Normal range of motion. Neck supple.  Cardiovascular: Normal rate, regular rhythm, normal heart sounds and intact distal pulses.   No murmur heard. Respiratory: Effort normal  and breath sounds normal. No respiratory distress. She has no wheezes.  GI: Soft. Bowel sounds are normal. She exhibits no distension. There is no tenderness.  Musculoskeletal:       Right hip: Normal.       Left hip: Normal.       Right knee: She exhibits decreased range of motion, swelling and effusion. She exhibits no erythema. Tenderness found. Medial joint line tenderness noted. No lateral joint line tenderness noted.       Left knee: Normal.       Right lower leg: She exhibits no tenderness and no swelling.       Left lower leg: She exhibits no tenderness and no swelling.  Evaluation of her right knee shows a moderate effusion. There is no warmth about the knee. Her range of motion is about 0 to 115 degrees. She has a fair amount of varus/valgus play and some anterior/posterior play too. She is very tender along the medial femur.  Neurological: She is alert and oriented to person, place, and time. She has normal strength and normal reflexes. No sensory deficit.  Skin: No rash noted. She is not diaphoretic. No erythema.  Psychiatric: She has a normal mood and affect. Her behavior is normal.    Vital signs in last 24 hours: Temp:  [97.7 F (36.5 C)] 97.7 F (36.5 C) (12/29 1336) Pulse Rate:  [64] 64 (12/29 1336) Resp:  [18] 18 (12/29 1336) BP: (142)/(56) 142/56 mmHg (12/29 1336) SpO2:  [95 %] 95 % (12/29 1336) Weight:  [92.534 kg (204 lb)] 92.534 kg (204 lb) (12/29  1336)   Imaging Review Plain radiographs demonstrate severe degenerative joint disease of the right knee(s). The overall alignment is neutral.There is no evidence of loosening of the femoral, tibial and patellar components but the patient has significant lytic cyst formation around the medial femur. The bone quality appears to be fair for age and reported activity level.   Assessment/Plan:  End stage arthritis, right knee(s) with failed previous arthroplasty.   The patient history, physical examination, clinical judgment of the provider and imaging studies are consistent with end stage degenerative joint disease of the right knee(s), previous total knee arthroplasty. Revision total knee arthroplasty vs right knee polyethylene revision with grafting is deemed medically necessary. The treatment options including medical management, injection therapy, arthroscopy and revision arthroplasty were discussed at length. The risks and benefits of revision total knee arthroplasty were presented and reviewed. The risks due to aseptic loosening, infection, stiffness, patella tracking problems, thromboembolic complications and other imponderables were discussed. The patient acknowledged the explanation, agreed to proceed with the plan and consent was signed. Patient is being admitted for inpatient treatment for surgery, pain control, PT, OT, prophylactic antibiotics, VTE prophylaxis, progressive ambulation and ADL's and discharge planning.The patient is planning to be discharged home with home health services   TXA topical  PCP: Dr. Thayer HeadingsBrian Mackenzie  Dimitri PedAmber Elizette Shek, PA-C

## 2014-03-22 NOTE — Progress Notes (Signed)
CXR report from 03/20/2014 on chart.

## 2014-03-22 NOTE — Progress Notes (Signed)
Requesed t CXr report done  At Dr Thayer HeadingsBrian MacKenzie.  Left message on Medical Records phone At office.

## 2014-03-28 ENCOUNTER — Inpatient Hospital Stay (HOSPITAL_COMMUNITY): Payer: Medicare Other | Admitting: Anesthesiology

## 2014-03-28 ENCOUNTER — Encounter (HOSPITAL_COMMUNITY): Payer: Self-pay | Admitting: Anesthesiology

## 2014-03-28 ENCOUNTER — Encounter (HOSPITAL_COMMUNITY)
Admission: RE | Disposition: A | Discharge status: Home Health | Payer: Self-pay | Source: Ambulatory Visit | Attending: Orthopedic Surgery

## 2014-03-28 ENCOUNTER — Inpatient Hospital Stay (HOSPITAL_COMMUNITY)
Admission: RE | Admit: 2014-03-28 | Discharge: 2014-03-30 | DRG: 468 | Disposition: A | Discharge status: Home Health | Payer: Medicare Other | Source: Ambulatory Visit | Attending: Orthopedic Surgery | Admitting: Orthopedic Surgery

## 2014-03-28 DIAGNOSIS — G4733 Obstructive sleep apnea (adult) (pediatric): Secondary | ICD-10-CM | POA: Diagnosis present

## 2014-03-28 DIAGNOSIS — Z96652 Presence of left artificial knee joint: Secondary | ICD-10-CM | POA: Diagnosis present

## 2014-03-28 DIAGNOSIS — Z880 Allergy status to penicillin: Secondary | ICD-10-CM

## 2014-03-28 DIAGNOSIS — Z86010 Personal history of colonic polyps: Secondary | ICD-10-CM | POA: Diagnosis not present

## 2014-03-28 DIAGNOSIS — M179 Osteoarthritis of knee, unspecified: Secondary | ICD-10-CM | POA: Diagnosis present

## 2014-03-28 DIAGNOSIS — Z881 Allergy status to other antibiotic agents status: Secondary | ICD-10-CM

## 2014-03-28 DIAGNOSIS — K219 Gastro-esophageal reflux disease without esophagitis: Secondary | ICD-10-CM | POA: Diagnosis present

## 2014-03-28 DIAGNOSIS — M8548 Solitary bone cyst, other site: Secondary | ICD-10-CM | POA: Diagnosis present

## 2014-03-28 DIAGNOSIS — M1710 Unilateral primary osteoarthritis, unspecified knee: Secondary | ICD-10-CM | POA: Diagnosis present

## 2014-03-28 DIAGNOSIS — I10 Essential (primary) hypertension: Secondary | ICD-10-CM | POA: Diagnosis present

## 2014-03-28 DIAGNOSIS — Z79899 Other long term (current) drug therapy: Secondary | ICD-10-CM

## 2014-03-28 DIAGNOSIS — I6523 Occlusion and stenosis of bilateral carotid arteries: Secondary | ICD-10-CM | POA: Diagnosis present

## 2014-03-28 DIAGNOSIS — Z8249 Family history of ischemic heart disease and other diseases of the circulatory system: Secondary | ICD-10-CM

## 2014-03-28 DIAGNOSIS — Z7982 Long term (current) use of aspirin: Secondary | ICD-10-CM

## 2014-03-28 DIAGNOSIS — T84012A Broken internal right knee prosthesis, initial encounter: Secondary | ICD-10-CM

## 2014-03-28 DIAGNOSIS — F419 Anxiety disorder, unspecified: Secondary | ICD-10-CM | POA: Diagnosis present

## 2014-03-28 DIAGNOSIS — F42 Obsessive-compulsive disorder: Secondary | ICD-10-CM | POA: Diagnosis present

## 2014-03-28 DIAGNOSIS — Z87442 Personal history of urinary calculi: Secondary | ICD-10-CM

## 2014-03-28 DIAGNOSIS — Z87891 Personal history of nicotine dependence: Secondary | ICD-10-CM | POA: Diagnosis not present

## 2014-03-28 DIAGNOSIS — E780 Pure hypercholesterolemia: Secondary | ICD-10-CM | POA: Diagnosis present

## 2014-03-28 DIAGNOSIS — F329 Major depressive disorder, single episode, unspecified: Secondary | ICD-10-CM | POA: Diagnosis present

## 2014-03-28 DIAGNOSIS — M170 Bilateral primary osteoarthritis of knee: Secondary | ICD-10-CM | POA: Diagnosis present

## 2014-03-28 DIAGNOSIS — T84092A Other mechanical complication of internal right knee prosthesis, initial encounter: Secondary | ICD-10-CM | POA: Diagnosis present

## 2014-03-28 HISTORY — PX: TOTAL KNEE REVISION: SHX996

## 2014-03-28 LAB — TYPE AND SCREEN
ABO/RH(D): A POS
Antibody Screen: NEGATIVE

## 2014-03-28 LAB — ABO/RH: ABO/RH(D): A POS

## 2014-03-28 SURGERY — TOTAL KNEE REVISION
Anesthesia: Monitor Anesthesia Care | Laterality: Right

## 2014-03-28 MED ORDER — SODIUM CHLORIDE 0.9 % IV SOLN: INTRAVENOUS | Status: DC

## 2014-03-28 MED ORDER — DEXAMETHASONE SODIUM PHOSPHATE 10 MG/ML IJ SOLN
INTRAMUSCULAR | Status: AC
  Filled 2014-03-28: qty 1

## 2014-03-28 MED ORDER — OXYCODONE HCL 5 MG PO TABS: 5.0000 mg | ORAL_TABLET | Freq: Once | ORAL | Status: DC | PRN

## 2014-03-28 MED ORDER — ONDANSETRON HCL 4 MG/2ML IJ SOLN
INTRAMUSCULAR | Status: AC
  Filled 2014-03-28: qty 2

## 2014-03-28 MED ORDER — PROPOFOL 10 MG/ML IV BOLUS
INTRAVENOUS | Status: AC
  Filled 2014-03-28: qty 20

## 2014-03-28 MED ORDER — ACETAMINOPHEN 10 MG/ML IV SOLN
INTRAVENOUS | Status: DC | PRN
  Administered 2014-03-28: 1000 mg via INTRAVENOUS

## 2014-03-28 MED ORDER — PHENYLEPHRINE HCL 10 MG/ML IJ SOLN
INTRAMUSCULAR | Status: DC | PRN
  Administered 2014-03-28 (×2): 40 ug via INTRAVENOUS

## 2014-03-28 MED ORDER — POLYETHYLENE GLYCOL 3350 17 G PO PACK: 17.0000 g | PACK | Freq: Every day | ORAL | Status: DC | PRN

## 2014-03-28 MED ORDER — DEXAMETHASONE SODIUM PHOSPHATE 10 MG/ML IJ SOLN
10.0000 mg | Freq: Once | INTRAMUSCULAR | Status: AC
  Administered 2014-03-29: 10 mg via INTRAVENOUS
  Filled 2014-03-28: qty 1

## 2014-03-28 MED ORDER — PHENOL 1.4 % MT LIQD: 1.0000 | OROMUCOSAL | Status: DC | PRN

## 2014-03-28 MED ORDER — METHOCARBAMOL 1000 MG/10ML IJ SOLN
500.0000 mg | Freq: Four times a day (QID) | INTRAMUSCULAR | Status: DC | PRN
  Administered 2014-03-28: 500 mg via INTRAVENOUS
  Filled 2014-03-28 (×2): qty 5

## 2014-03-28 MED ORDER — CLINDAMYCIN PHOSPHATE 900 MG/50ML IV SOLN
INTRAVENOUS | Status: AC
  Filled 2014-03-28: qty 50

## 2014-03-28 MED ORDER — BUPIVACAINE LIPOSOME 1.3 % IJ SUSP
INTRAMUSCULAR | Status: DC | PRN
  Administered 2014-03-28: 20 mL

## 2014-03-28 MED ORDER — MIDAZOLAM HCL 5 MG/5ML IJ SOLN
INTRAMUSCULAR | Status: DC | PRN
  Administered 2014-03-28: 2 mg via INTRAVENOUS

## 2014-03-28 MED ORDER — PHENYLEPHRINE 40 MCG/ML (10ML) SYRINGE FOR IV PUSH (FOR BLOOD PRESSURE SUPPORT)
PREFILLED_SYRINGE | INTRAVENOUS | Status: AC
  Filled 2014-03-28: qty 10

## 2014-03-28 MED ORDER — TRAMADOL HCL 50 MG PO TABS
50.0000 mg | ORAL_TABLET | Freq: Four times a day (QID) | ORAL | Status: DC | PRN
  Administered 2014-03-29 (×2): 100 mg via ORAL
  Filled 2014-03-28 (×2): qty 2

## 2014-03-28 MED ORDER — BISACODYL 10 MG RE SUPP: 10.0000 mg | Freq: Every day | RECTAL | Status: DC | PRN

## 2014-03-28 MED ORDER — MENTHOL 3 MG MT LOZG: 1.0000 | LOZENGE | OROMUCOSAL | Status: DC | PRN

## 2014-03-28 MED ORDER — SODIUM CHLORIDE 0.9 % IJ SOLN
INTRAMUSCULAR | Status: DC | PRN
  Administered 2014-03-28: 30 mL via INTRAVENOUS

## 2014-03-28 MED ORDER — KETOROLAC TROMETHAMINE 15 MG/ML IJ SOLN
INTRAMUSCULAR | Status: AC
  Filled 2014-03-28: qty 1

## 2014-03-28 MED ORDER — ACETAMINOPHEN 325 MG PO TABS: 650.0000 mg | ORAL_TABLET | Freq: Four times a day (QID) | ORAL | Status: DC | PRN

## 2014-03-28 MED ORDER — ATORVASTATIN CALCIUM 40 MG PO TABS
40.0000 mg | ORAL_TABLET | Freq: Every day | ORAL | Status: DC
  Administered 2014-03-29: 40 mg via ORAL
  Filled 2014-03-28 (×2): qty 1

## 2014-03-28 MED ORDER — CLINDAMYCIN PHOSPHATE 600 MG/50ML IV SOLN
600.0000 mg | Freq: Four times a day (QID) | INTRAVENOUS | Status: AC
  Administered 2014-03-28 (×2): 600 mg via INTRAVENOUS
  Filled 2014-03-28 (×2): qty 50

## 2014-03-28 MED ORDER — HYDROCHLOROTHIAZIDE 12.5 MG PO CAPS
12.5000 mg | ORAL_CAPSULE | Freq: Every morning | ORAL | Status: DC
  Administered 2014-03-29 – 2014-03-30 (×2): 12.5 mg via ORAL
  Filled 2014-03-28 (×2): qty 1

## 2014-03-28 MED ORDER — BUPIVACAINE IN DEXTROSE 0.75-8.25 % IT SOLN
INTRATHECAL | Status: DC | PRN
  Administered 2014-03-28: 1.5 mL via INTRATHECAL

## 2014-03-28 MED ORDER — ONDANSETRON HCL 4 MG/2ML IJ SOLN: 4.0000 mg | Freq: Four times a day (QID) | INTRAMUSCULAR | Status: DC | PRN

## 2014-03-28 MED ORDER — TRANEXAMIC ACID 100 MG/ML IV SOLN
2000.0000 mg | INTRAVENOUS | Status: DC | PRN
  Administered 2014-03-28: 2000 mg via TOPICAL

## 2014-03-28 MED ORDER — PANTOPRAZOLE SODIUM 40 MG PO TBEC
40.0000 mg | DELAYED_RELEASE_TABLET | Freq: Every day | ORAL | Status: DC
  Filled 2014-03-28: qty 1

## 2014-03-28 MED ORDER — CLINDAMYCIN PHOSPHATE 900 MG/50ML IV SOLN
900.0000 mg | INTRAVENOUS | Status: AC
  Administered 2014-03-28: 900 mg via INTRAVENOUS

## 2014-03-28 MED ORDER — OXYCODONE HCL 5 MG PO TABS
5.0000 mg | ORAL_TABLET | ORAL | Status: DC | PRN
  Administered 2014-03-28: 10 mg via ORAL
  Administered 2014-03-28: 5 mg via ORAL
  Administered 2014-03-29 – 2014-03-30 (×5): 10 mg via ORAL
  Filled 2014-03-28: qty 1
  Filled 2014-03-28 (×6): qty 2

## 2014-03-28 MED ORDER — DEXTROSE 5 % IV SOLN
10.0000 mg | INTRAVENOUS | Status: DC | PRN
Start: 2014-03-28 — End: 2014-03-28
  Administered 2014-03-28: 40 ug/min via INTRAVENOUS

## 2014-03-28 MED ORDER — METHOCARBAMOL 500 MG PO TABS
500.0000 mg | ORAL_TABLET | Freq: Four times a day (QID) | ORAL | Status: DC | PRN
Start: 2014-03-28 — End: 2014-03-30
  Administered 2014-03-29 – 2014-03-30 (×3): 500 mg via ORAL
  Filled 2014-03-28 (×3): qty 1

## 2014-03-28 MED ORDER — KETOROLAC TROMETHAMINE 15 MG/ML IJ SOLN
7.5000 mg | Freq: Four times a day (QID) | INTRAMUSCULAR | Status: AC | PRN
  Administered 2014-03-28: 7.5 mg via INTRAVENOUS

## 2014-03-28 MED ORDER — ACETAMINOPHEN 500 MG PO TABS
1000.0000 mg | ORAL_TABLET | Freq: Four times a day (QID) | ORAL | Status: AC
  Administered 2014-03-28 – 2014-03-29 (×4): 1000 mg via ORAL
  Filled 2014-03-28 (×5): qty 2

## 2014-03-28 MED ORDER — TRANEXAMIC ACID 100 MG/ML IV SOLN
2000.0000 mg | Freq: Once | INTRAVENOUS | Status: DC
  Filled 2014-03-28: qty 20

## 2014-03-28 MED ORDER — 0.9 % SODIUM CHLORIDE (POUR BTL) OPTIME
TOPICAL | Status: DC | PRN
  Administered 2014-03-28: 1000 mL

## 2014-03-28 MED ORDER — ACETAMINOPHEN 160 MG/5ML PO SOLN: 325.0000 mg | ORAL | Status: DC | PRN

## 2014-03-28 MED ORDER — RIVAROXABAN 10 MG PO TABS
10.0000 mg | ORAL_TABLET | Freq: Every day | ORAL | Status: DC
  Administered 2014-03-29 – 2014-03-30 (×2): 10 mg via ORAL
  Filled 2014-03-28 (×3): qty 1

## 2014-03-28 MED ORDER — MIDAZOLAM HCL 2 MG/2ML IJ SOLN
INTRAMUSCULAR | Status: AC
  Filled 2014-03-28: qty 2

## 2014-03-28 MED ORDER — DEXAMETHASONE SODIUM PHOSPHATE 10 MG/ML IJ SOLN
10.0000 mg | Freq: Once | INTRAMUSCULAR | Status: AC
  Administered 2014-03-28: 10 mg via INTRAVENOUS

## 2014-03-28 MED ORDER — ACETAMINOPHEN 10 MG/ML IV SOLN
1000.0000 mg | Freq: Once | INTRAVENOUS | Status: DC
  Filled 2014-03-28: qty 100

## 2014-03-28 MED ORDER — SODIUM CHLORIDE 0.9 % IJ SOLN
INTRAMUSCULAR | Status: AC
  Filled 2014-03-28: qty 50

## 2014-03-28 MED ORDER — PROPOFOL INFUSION 10 MG/ML OPTIME
INTRAVENOUS | Status: DC | PRN
  Administered 2014-03-28: 75 ug/kg/min via INTRAVENOUS

## 2014-03-28 MED ORDER — CITALOPRAM HYDROBROMIDE 40 MG PO TABS
40.0000 mg | ORAL_TABLET | Freq: Every morning | ORAL | Status: DC
  Administered 2014-03-29 – 2014-03-30 (×2): 40 mg via ORAL
  Filled 2014-03-28 (×2): qty 1

## 2014-03-28 MED ORDER — MORPHINE SULFATE 2 MG/ML IJ SOLN: 1.0000 mg | INTRAMUSCULAR | Status: DC | PRN

## 2014-03-28 MED ORDER — FENTANYL CITRATE 0.05 MG/ML IJ SOLN
INTRAMUSCULAR | Status: AC
  Filled 2014-03-28: qty 2

## 2014-03-28 MED ORDER — FENTANYL CITRATE 0.05 MG/ML IJ SOLN
INTRAMUSCULAR | Status: DC | PRN
  Administered 2014-03-28: 50 ug via INTRAVENOUS

## 2014-03-28 MED ORDER — BUPIVACAINE HCL (PF) 0.25 % IJ SOLN
INTRAMUSCULAR | Status: AC
  Filled 2014-03-28: qty 30

## 2014-03-28 MED ORDER — BUPIVACAINE HCL 0.25 % IJ SOLN
INTRAMUSCULAR | Status: DC | PRN
  Administered 2014-03-28: 30 mL

## 2014-03-28 MED ORDER — HYDROMORPHONE HCL 1 MG/ML IJ SOLN: 0.2500 mg | INTRAMUSCULAR | Status: DC | PRN

## 2014-03-28 MED ORDER — METOPROLOL TARTRATE 100 MG PO TABS
100.0000 mg | ORAL_TABLET | Freq: Two times a day (BID) | ORAL | Status: DC
Start: 2014-03-28 — End: 2014-03-30
  Administered 2014-03-29 – 2014-03-30 (×2): 100 mg via ORAL
  Filled 2014-03-28 (×5): qty 1

## 2014-03-28 MED ORDER — SODIUM CHLORIDE 0.9 % IR SOLN
Status: DC | PRN
  Administered 2014-03-28: 1000 mL

## 2014-03-28 MED ORDER — METOCLOPRAMIDE HCL 10 MG PO TABS: 5.0000 mg | ORAL_TABLET | Freq: Three times a day (TID) | ORAL | Status: DC | PRN

## 2014-03-28 MED ORDER — BUPIVACAINE LIPOSOME 1.3 % IJ SUSP
20.0000 mL | Freq: Once | INTRAMUSCULAR | Status: DC
  Filled 2014-03-28: qty 20

## 2014-03-28 MED ORDER — ACETAMINOPHEN 650 MG RE SUPP: 650.0000 mg | Freq: Four times a day (QID) | RECTAL | Status: DC | PRN

## 2014-03-28 MED ORDER — PROPOFOL 10 MG/ML IV BOLUS
INTRAVENOUS | Status: DC | PRN
  Administered 2014-03-28: 30 mg via INTRAVENOUS
  Administered 2014-03-28: 20 mg via INTRAVENOUS

## 2014-03-28 MED ORDER — ACETAMINOPHEN 160 MG/5ML PO SOLN
325.0000 mg | ORAL | Status: DC | PRN
  Filled 2014-03-28: qty 20.3

## 2014-03-28 MED ORDER — NITROGLYCERIN 0.4 MG SL SUBL: 0.4000 mg | SUBLINGUAL_TABLET | SUBLINGUAL | Status: DC | PRN

## 2014-03-28 MED ORDER — ONDANSETRON HCL 4 MG/2ML IJ SOLN
INTRAMUSCULAR | Status: DC | PRN
  Administered 2014-03-28: 4 mg via INTRAVENOUS

## 2014-03-28 MED ORDER — FLEET ENEMA 7-19 GM/118ML RE ENEM: 1.0000 | ENEMA | Freq: Once | RECTAL | Status: AC | PRN

## 2014-03-28 MED ORDER — OXYCODONE HCL 5 MG/5ML PO SOLN
5.0000 mg | Freq: Once | ORAL | Status: DC | PRN
  Filled 2014-03-28: qty 5

## 2014-03-28 MED ORDER — OXYCODONE HCL 5 MG/5ML PO SOLN: 5.0000 mg | Freq: Once | ORAL | Status: DC | PRN

## 2014-03-28 MED ORDER — DOCUSATE SODIUM 100 MG PO CAPS
100.0000 mg | ORAL_CAPSULE | Freq: Two times a day (BID) | ORAL | Status: DC
  Administered 2014-03-28 – 2014-03-30 (×4): 100 mg via ORAL

## 2014-03-28 MED ORDER — METOCLOPRAMIDE HCL 5 MG/ML IJ SOLN: 5.0000 mg | Freq: Three times a day (TID) | INTRAMUSCULAR | Status: DC | PRN

## 2014-03-28 MED ORDER — ACETAMINOPHEN 325 MG PO TABS: 325.0000 mg | ORAL_TABLET | ORAL | Status: DC | PRN

## 2014-03-28 MED ORDER — ACETAMINOPHEN 325 MG PO TABS
325.0000 mg | ORAL_TABLET | ORAL | Status: DC | PRN
Start: 2014-03-28 — End: 2014-03-28

## 2014-03-28 MED ORDER — ONDANSETRON HCL 4 MG PO TABS: 4.0000 mg | ORAL_TABLET | Freq: Four times a day (QID) | ORAL | Status: DC | PRN

## 2014-03-28 MED ORDER — DIPHENHYDRAMINE HCL 12.5 MG/5ML PO ELIX
12.5000 mg | ORAL_SOLUTION | ORAL | Status: DC | PRN
Start: 2014-03-28 — End: 2014-03-30

## 2014-03-28 MED ORDER — LACTATED RINGERS IV SOLN
INTRAVENOUS | Status: DC
  Administered 2014-03-28: 1000 mL via INTRAVENOUS
  Administered 2014-03-28: 14:00:00 via INTRAVENOUS

## 2014-03-28 SURGICAL SUPPLY — 60 items
BAG SPEC THK2 15X12 ZIP CLS (MISCELLANEOUS)
BAG ZIPLOCK 12X15 (MISCELLANEOUS) IMPLANT
BANDAGE ELASTIC 6 VELCRO ST LF (GAUZE/BANDAGES/DRESSINGS) ×2 IMPLANT
BANDAGE ESMARK 6X9 LF (GAUZE/BANDAGES/DRESSINGS) ×1 IMPLANT
BLADE SAG 18X100X1.27 (BLADE) ×2 IMPLANT
BLADE SAW SGTL 11.0X1.19X90.0M (BLADE) ×2 IMPLANT
BNDG CMPR 9X6 STRL LF SNTH (GAUZE/BANDAGES/DRESSINGS) ×1
BNDG ESMARK 6X9 LF (GAUZE/BANDAGES/DRESSINGS) ×2
CUBES CANC 30CC PCANCUBE30 (Bone Implant) ×2 IMPLANT
CUFF TOURN SGL QUICK 34 (TOURNIQUET CUFF) ×1
CUFF TRNQT CYL 34X4X40X1 (TOURNIQUET CUFF) ×1 IMPLANT
DRAPE EXTREMITY T 121X128X90 (DRAPE) ×2 IMPLANT
DRAPE POUCH INSTRU U-SHP 10X18 (DRAPES) ×2 IMPLANT
DRAPE U-SHAPE 47X51 STRL (DRAPES) ×2 IMPLANT
DRSG ADAPTIC 3X8 NADH LF (GAUZE/BANDAGES/DRESSINGS) ×2 IMPLANT
DRSG PAD ABDOMINAL 8X10 ST (GAUZE/BANDAGES/DRESSINGS) ×2 IMPLANT
DURAPREP 26ML APPLICATOR (WOUND CARE) ×2 IMPLANT
ELECT REM PT RETURN 9FT ADLT (ELECTROSURGICAL) ×2
ELECTRODE REM PT RTRN 9FT ADLT (ELECTROSURGICAL) ×1 IMPLANT
EVACUATOR 1/8 PVC DRAIN (DRAIN) ×2 IMPLANT
FACESHIELD WRAPAROUND (MASK) ×8 IMPLANT
GAUZE SPONGE 4X4 12PLY STRL (GAUZE/BANDAGES/DRESSINGS) ×2 IMPLANT
GLOVE BIO SURGEON STRL SZ7.5 (GLOVE) IMPLANT
GLOVE BIO SURGEON STRL SZ8 (GLOVE) ×2 IMPLANT
GLOVE BIOGEL PI IND STRL 8 (GLOVE) ×1 IMPLANT
GLOVE BIOGEL PI INDICATOR 8 (GLOVE) ×1
GLOVE SURG SS PI 6.5 STRL IVOR (GLOVE) IMPLANT
GOWN STRL REUS W/TWL LRG LVL3 (GOWN DISPOSABLE) ×2 IMPLANT
GOWN STRL REUS W/TWL XL LVL3 (GOWN DISPOSABLE) IMPLANT
HANDPIECE INTERPULSE COAX TIP (DISPOSABLE) ×2
IMMOBILIZER KNEE 20 (SOFTGOODS) ×2
IMMOBILIZER KNEE 20 THIGH 36 (SOFTGOODS) ×1 IMPLANT
INSERT 2.5X15MM (Knees) ×2 IMPLANT
KIT BASIN OR (CUSTOM PROCEDURE TRAY) ×2 IMPLANT
MANIFOLD NEPTUNE II (INSTRUMENTS) ×2 IMPLANT
NDL SAFETY ECLIPSE 18X1.5 (NEEDLE) ×2 IMPLANT
NEEDLE HYPO 18GX1.5 SHARP (NEEDLE) ×4
NS IRRIG 1000ML POUR BTL (IV SOLUTION) ×2 IMPLANT
PACK TOTAL JOINT (CUSTOM PROCEDURE TRAY) ×2 IMPLANT
PAD ABD 8X10 STRL (GAUZE/BANDAGES/DRESSINGS) ×2 IMPLANT
PADDING CAST COTTON 6X4 STRL (CAST SUPPLIES) ×4 IMPLANT
POSITIONER SURGICAL ARM (MISCELLANEOUS) ×2 IMPLANT
SET HNDPC FAN SPRY TIP SCT (DISPOSABLE) ×1 IMPLANT
STAPLER VISISTAT 35W (STAPLE) ×2 IMPLANT
STRIP CLOSURE SKIN 1/2X4 (GAUZE/BANDAGES/DRESSINGS) ×2 IMPLANT
SUCTION FRAZIER 12FR DISP (SUCTIONS) ×2 IMPLANT
SUT VIC AB 2-0 CT1 27 (SUTURE) ×6
SUT VIC AB 2-0 CT1 TAPERPNT 27 (SUTURE) ×3 IMPLANT
SUT VLOC 180 0 24IN GS25 (SUTURE) ×2 IMPLANT
SWAB COLLECTION DEVICE MRSA (MISCELLANEOUS) IMPLANT
SYR 20CC LL (SYRINGE) ×2 IMPLANT
SYR 50ML LL SCALE MARK (SYRINGE) ×2 IMPLANT
TOWEL OR 17X26 10 PK STRL BLUE (TOWEL DISPOSABLE) ×2 IMPLANT
TOWEL OR NON WOVEN STRL DISP B (DISPOSABLE) IMPLANT
TOWER CARTRIDGE SMART MIX (DISPOSABLE) ×2 IMPLANT
TRAY FOLEY CATH 14FRSI W/METER (CATHETERS) ×2 IMPLANT
TUBE ANAEROBIC SPECIMEN COL (MISCELLANEOUS) IMPLANT
TUBE KAMVAC SUCTION (TUBING) IMPLANT
WATER STERILE IRR 1500ML POUR (IV SOLUTION) ×2 IMPLANT
WRAP KNEE MAXI GEL POST OP (GAUZE/BANDAGES/DRESSINGS) ×2 IMPLANT

## 2014-03-28 NOTE — Transfer of Care (Signed)
Immediate Anesthesia Transfer of Care Note  Patient: Tiffany Burke  Procedure(s) Performed: Procedure(s) (LRB): RIGHT KNEE POLYETHYLENE REVISION WITH FEMORAL BONE GRAFTING (Right)  Patient Location: PACU  Anesthesia Type: Spinal  Level of Consciousness: sedated, patient cooperative and responds to stimulation  Airway & Oxygen Therapy: Patient Spontanous Breathing and Patient connected to face mask oxgen  Post-op Assessment: Report given to PACU RN and Post -op Vital signs reviewed and stable  Post vital signs: Reviewed and stable  Complications: No apparent anesthesia complications, T-12 level on exam, released stable condition denied pain on assessment.

## 2014-03-28 NOTE — Brief Op Note (Signed)
  03/28/2014  1:35 PM  PATIENT:  Tiffany Burke  74 y.o. female  PRE-OPERATIVE DIAGNOSIS:  FAILED RIGHT TOTAL KNEE ARTHROPLASTY  POST-OPERATIVE DIAGNOSIS:  FAILED RIGHT TOTAL KNEE ARTHROPLASTY  PROCEDURE:  Procedure(s): RIGHT KNEE POLYETHYLENE REVISION WITH FEMORAL BONE GRAFTING (Right)  SURGEON:  Surgeon(s) and Role:    * Loanne DrillingFrank Andrews Tener V, MD - Primary  PHYSICIAN ASSISTANT:   ASSISTANTS: Avel Peacerew Perkins, PA-C   ANESTHESIA:   spinal  EBL:  Total I/O In: 1000 [I.V.:1000] Out: 350 [Urine:300; Blood:50]  BLOOD ADMINISTERED:none  DRAINS: (Medium) Hemovact drain(s) in the right knee with  Suction Open   LOCAL MEDICATIONS USED:  OTHER Exparel  COUNTS:  YES  TOURNIQUET: 39 minutes @ 300 mm HG  DICTATION: .Other Dictation: Dictation Number [id number]  PLAN OF CARE: Admit to inpatient   PATIENT DISPOSITION:  PACU - hemodynamically stable.

## 2014-03-28 NOTE — Anesthesia Preprocedure Evaluation (Addendum)
Anesthesia Evaluation  Patient identified by MRN, date of birth, ID band Patient awake    Reviewed: Allergy & Precautions, NPO status , Patient's Chart, lab work & pertinent test results  History of Anesthesia Complications Negative for: history of anesthetic complications  Airway Mallampati: III  TM Distance: >3 FB Neck ROM: Full    Dental  (+) Teeth Intact   Pulmonary neg shortness of breath, sleep apnea and Continuous Positive Airway Pressure Ventilation , neg COPDneg recent URI, former smoker,  breath sounds clear to auscultation        Cardiovascular hypertension, Pt. on medications and Pt. on home beta blockers + angina + Peripheral Vascular Disease - Past MI, - CHF and - DOE - dysrhythmias Rhythm:Regular     Neuro/Psych PSYCHIATRIC DISORDERS Anxiety Depression negative neurological ROS     GI/Hepatic Neg liver ROS, GERD-  Medicated and Controlled,  Endo/Other  neg diabetesMorbid obesity  Renal/GU negative Renal ROS     Musculoskeletal  (+) Arthritis -, Osteoarthritis,    Abdominal   Peds  Hematology negative hematology ROS (+)   Anesthesia Other Findings   Reproductive/Obstetrics                            Anesthesia Physical Anesthesia Plan  ASA: III  Anesthesia Plan: Spinal and MAC   Post-op Pain Management:    Induction: Intravenous  Airway Management Planned: Natural Airway and Simple Face Mask  Additional Equipment: None  Intra-op Plan:   Post-operative Plan:   Informed Consent: I have reviewed the patients History and Physical, chart, labs and discussed the procedure including the risks, benefits and alternatives for the proposed anesthesia with the patient or authorized representative who has indicated his/her understanding and acceptance.   Dental advisory given  Plan Discussed with: CRNA and Surgeon  Anesthesia Plan Comments:         Anesthesia Quick  Evaluation

## 2014-03-28 NOTE — Interval H&P Note (Signed)
History and Physical Interval Note:  03/28/2014 12:01 PM  Tiffany Burke  has presented today for surgery, with the diagnosis of FAILED RIGHT TOTAL KNEE ARTHROPLASTY  The various methods of treatment have been discussed with the patient and family. After consideration of risks, benefits and other options for treatment, the patient has consented to  Procedure(s): RIGHT KNEE POLYETHYLENE REVISION VS TOTAL KNEE ARTHROPLASTY REVISION (Right) as a surgical intervention .  The patient's history has been reviewed, patient examined, no change in status, stable for surgery.  I have reviewed the patient's chart and labs.  Questions were answered to the patient's satisfaction.     Loanne DrillingALUISIO,Ameya Vowell V

## 2014-03-29 ENCOUNTER — Encounter (HOSPITAL_COMMUNITY): Payer: Self-pay | Admitting: Orthopedic Surgery

## 2014-03-29 LAB — CBC
HEMATOCRIT: 36.4 % (ref 36.0–46.0)
HEMOGLOBIN: 12.7 g/dL (ref 12.0–15.0)
MCH: 32.9 pg (ref 26.0–34.0)
MCHC: 34.9 g/dL (ref 30.0–36.0)
MCV: 94.3 fL (ref 78.0–100.0)
PLATELETS: 107 10*3/uL — AB (ref 150–400)
RBC: 3.86 MIL/uL — ABNORMAL LOW (ref 3.87–5.11)
RDW: 12 % (ref 11.5–15.5)
WBC: 7.3 10*3/uL (ref 4.0–10.5)

## 2014-03-29 LAB — BASIC METABOLIC PANEL
Anion gap: 8 (ref 5–15)
BUN: 15 mg/dL (ref 6–23)
CO2: 28 mmol/L (ref 19–32)
Calcium: 9.1 mg/dL (ref 8.4–10.5)
Chloride: 102 mEq/L (ref 96–112)
Creatinine, Ser: 0.74 mg/dL (ref 0.50–1.10)
GFR calc Af Amer: >90 mL/min (ref >90)
GFR calc non Af Amer: 84 mL/min — ABNORMAL LOW (ref >90)
GLUCOSE: 174 mg/dL — AB (ref 70–99)
POTASSIUM: 4 mmol/L (ref 3.5–5.1)
Sodium: 138 mmol/L (ref 135–145)

## 2014-03-29 MED ORDER — NON FORMULARY: 20.0000 mg | Status: DC

## 2014-03-29 MED ORDER — OMEPRAZOLE 20 MG PO CPDR
20.0000 mg | DELAYED_RELEASE_CAPSULE | Freq: Every day | ORAL | Status: DC
  Administered 2014-03-29 – 2014-03-30 (×2): 20 mg via ORAL
  Filled 2014-03-29 (×2): qty 1

## 2014-03-29 NOTE — Discharge Instructions (Addendum)
° °Dr. Frank Aluisio °Total Joint Specialist °[facility] °[address] °[place], [place] [address] °[phone] ° °TOTAL KNEE REPLACEMENT POSTOPERATIVE DIRECTIONS ° ° ° °Knee Rehabilitation, Guidelines Following Surgery  °Results after knee surgery are often greatly improved when you follow the exercise, range of motion and muscle strengthening exercises prescribed by your doctor. Safety measures are also important to protect the knee from further injury. Any time any of these exercises cause you to have increased pain or swelling in your knee joint, decrease the amount until you are comfortable again and slowly increase them. If you have problems or questions, call your caregiver or physical therapist for advice.  ° °HOME CARE INSTRUCTIONS  °Remove items at home which could result in a fall. This includes throw rugs or furniture in walking pathways.  °Continue medications as instructed at time of discharge. °You may have some home medications which will be placed on hold until you complete the course of blood thinner medication.  °You may start showering once you are discharged home but do not submerge the incision under water. Just pat the incision dry and apply a dry gauze dressing on daily. °Walk with walker as instructed.  °You may resume a sexual relationship in one month or when given the OK by  your doctor.  °· Use walker as long as suggested by your caregivers. °· Avoid periods of inactivity such as sitting longer than an hour when not asleep. This helps prevent blood clots.  °You may put full weight on your legs and walk as much as is comfortable.  °You may return to work once you are cleared by your doctor.  °Do not drive a car for 6 weeks or until released by you surgeon.  °· Do not drive while taking narcotics.  °Wear the elastic stockings for three weeks following surgery during the day but you may remove then at night. °Make sure you keep all of your appointments after your  operation with all of your doctors and caregivers. You should call the office at the above phone number and make an appointment for approximately two weeks after the date of your surgery. °Change the dressing daily and reapply a dry dressing each time. °Please pick up a stool softener and laxative for home use as long as you are requiring pain medications. °· ICE to the affected knee every three hours for 30 minutes at a time and then as needed for pain and swelling.  Continue to use ice on the knee for pain and swelling from surgery. You may notice swelling that will progress down to the foot and ankle.  This is normal after surgery.  Elevate the leg when you are not up walking on it.   °It is important for you to complete the blood thinner medication as prescribed by your doctor. °· Continue to use the breathing machine which will help keep your temperature down.  It is common for your temperature to cycle up and down following surgery, especially at night when you are not up moving around and exerting yourself.  The breathing machine keeps your lungs expanded and your temperature down. ° °RANGE OF MOTION AND STRENGTHENING EXERCISES  °Rehabilitation of the knee is important following a knee injury or an operation. After just a few days of immobilization, the muscles of the thigh which control the knee become weakened and shrink (atrophy). Knee exercises are designed to build up the tone and strength of the thigh muscles and to improve knee   motion. Often times heat used for twenty to thirty minutes before working out will loosen up your tissues and help with improving the range of motion but do not use heat for the first two weeks following surgery. These exercises can be done on a training (exercise) mat, on the floor, on a table or on a bed. Use what ever works the best and is most comfortable for you Knee exercises include:  Leg Lifts - While your knee is still immobilized in a splint or cast, you can do  straight leg raises. Lift the leg to 60 degrees, hold for 3 sec, and slowly lower the leg. Repeat 10-20 times 2-3 times daily. Perform this exercise against resistance later as your knee gets better.  Quad and Hamstring Sets - Tighten up the muscle on the front of the thigh (Quad) and hold for 5-10 sec. Repeat this 10-20 times hourly. Hamstring sets are done by pushing the foot backward against an object and holding for 5-10 sec. Repeat as with quad sets.  A rehabilitation program following serious knee injuries can speed recovery and prevent re-injury in the future due to weakened muscles. Contact your doctor or a physical therapist for more information on knee rehabilitation.   SKILLED REHAB INSTRUCTIONS: If the patient is transferred to a skilled rehab facility following release from the hospital, a list of the current medications will be sent to the facility for the patient to continue.  When discharged from the skilled rehab facility, please have the facility set up the patient's [place] prior to being released. Also, the skilled facility will be responsible for providing the patient with their medications at time of release from the facility to include their pain medication, the muscle relaxants, and their blood thinner medication. If the patient is still at the rehab facility at time of the two week follow up appointment, the skilled rehab facility will also need to assist the patient in arranging follow up appointment in our office and any transportation needs.  MAKE SURE YOU:  Understand these instructions.  Will watch your condition.  Will get help right away if you are not doing well or get worse.    Pick up stool softner and laxative for home use following surgery while on pain medications. Do not submerge incision under water. Please use good hand washing techniques while changing dressing each day. May shower starting three days after surgery. Please use a clean  towel to pat the incision dry following showers. Continue to use ice for pain and swelling after surgery. Do not use any lotions or creams on the incision until instructed by your surgeon.  Take Xarelto for two and a half more weeks, then discontinue Xarelto. Once the patient has completed the Xarelto, they may resume the 81 mg Aspirin.   Postoperative Constipation Protocol  Constipation - defined medically as fewer than three stools per week and severe constipation as less than one stool per week.  One of the most common issues patients have following surgery is constipation.  Even if you have a regular bowel pattern at home, your normal regimen is likely to be disrupted due to multiple reasons following surgery.  Combination of anesthesia, postoperative narcotics, change in appetite and fluid intake all can affect your bowels.  In order to avoid complications following surgery, here are some recommendations in order to help you during your recovery period.  Colace (docusate) - Pick up an over-the-counter form of Colace or another stool softener and  take twice a day as long as you are requiring postoperative pain medications.  Take with a full glass of water daily.  If you experience loose stools or diarrhea, hold the colace until you stool forms back up.  If your symptoms do not get better within 1 week or if they get worse, check with your doctor.  Dulcolax (bisacodyl) - Pick up over-the-counter and take as directed by the product packaging as needed to assist with the movement of your bowels.  Take with a full glass of water.  Use this product as needed if not relieved by Colace only.   MiraLax (polyethylene glycol) - Pick up over-the-counter to have on hand.  MiraLax is a solution that will increase the amount of water in your bowels to assist with bowel movements.  Take as directed and can mix with a glass of water, juice, soda, coffee, or tea.  Take if you go more than two days without a  movement. Do not use MiraLax more than once per day. Call your doctor if you are still constipated or irregular after using this medication for 7 days in a row.  If you continue to have problems with postoperative constipation, please contact the office for further assistance and recommendations.  If you experience "the worst abdominal pain ever" or develop nausea or vomiting, please contact the office immediatly for further recommendations for treatment.   Information on my medicine - XARELTO (Rivaroxaban)  This medication education was reviewed with me or my healthcare representative as part of my discharge preparation.  The pharmacist that spoke with me during my hospital stay was:  Absher, Ky Barbanandall K, RPH  Why was Xarelto prescribed for you? Xarelto was prescribed for you to reduce the risk of blood clots forming after orthopedic surgery. The medical term for these abnormal blood clots is venous thromboembolism (VTE).  What do you need to know about xarelto ? Take your Xarelto ONCE DAILY at the same time every day. You may take it either with or without food.  If you have difficulty swallowing the tablet whole, you may crush it and mix in applesauce just prior to taking your dose.  Take Xarelto exactly as prescribed by your doctor and DO NOT stop taking Xarelto without talking to the doctor who prescribed the medication.  Stopping without other VTE prevention medication to take the place of Xarelto may increase your risk of developing a clot.  After discharge, you should have regular check-up appointments with your healthcare provider that is prescribing your Xarelto.    What do you do if you miss a dose? If you miss a dose, take it as soon as you remember on the same day then continue your regularly scheduled once daily regimen the next day. Do not take two doses of Xarelto on the same day.   Important Safety Information A possible side effect of Xarelto is bleeding. You  should call your healthcare provider right away if you experience any of the following: ? Bleeding from an injury or your nose that does not stop. ? Unusual colored urine (red or dark brown) or unusual colored stools (red or black). ? Unusual bruising for unknown reasons. ? A serious fall or if you hit your head (even if there is no bleeding).  Some medicines may interact with Xarelto and might increase your risk of bleeding while on Xarelto. To help avoid this, consult your healthcare provider or pharmacist prior to using any new prescription or non-prescription medications, including herbals,  vitamins, non-steroidal anti-inflammatory drugs (NSAIDs) and supplements.  This website has more information on Xarelto: [email or web address]

## 2014-03-29 NOTE — Progress Notes (Signed)
Physical Therapy Treatment Patient Details Name: Tiffany Burke MRN: [id number] DOB: [birth year] Today's Date: 03/29/2014    History of Present Illness s/p R TK revision    PT Comments    Progressing well and hopeful for d.c tomorrow  Follow Up Recommendations  Home health PT     Equipment Recommendations  None recommended by PT    Recommendations for Other Services OT consult     Precautions / Restrictions Precautions Precautions: Knee;Fall Restrictions Weight Bearing Restrictions: No    Mobility  Bed Mobility Overal bed mobility: Needs Assistance Bed Mobility: Sit to Supine       Sit to supine: Min guard   General bed mobility comments: min cues for sequence and use of L LE to self assist  Transfers Overall transfer level: Needs assistance Equipment used: Rolling walker (2 wheeled) Transfers: Sit to/from Stand Sit to Stand: Min guard         General transfer comment: cues for LE management and use of UEs to self assist  Ambulation/Gait Ambulation/Gait assistance: Min guard Ambulation Distance (Feet): 80 Feet (and 15' back from bathroom) Assistive device: Rolling walker (2 wheeled) Gait Pattern/deviations: Step-to pattern;Step-through pattern;Trunk flexed     General Gait Details: cues for sequence, posture and position from [employer]   Stairs            Wheelchair Mobility    Modified Rankin (Stroke Patients Only)       Balance                                    Cognition Arousal/Alertness: Awake/alert Behavior During Therapy: WFL for tasks assessed/performed Overall Cognitive Status: Within Functional Limits for tasks assessed                      Exercises      General Comments        Pertinent Vitals/Pain Pain Assessment: 0-10 Pain Score: 5  Pain Location: R knee Pain Descriptors / Indicators: Aching;Sore Pain Intervention(s): Limited activity within patient's tolerance;Monitored during  session;Premedicated before session;Ice applied    Home Living                      Prior Function            PT Goals (current goals can now be found in the care plan section) Acute Rehab PT Goals Patient Stated Goal: get back to being independent PT Goal Formulation: With patient Time For Goal Achievement: 04/03/14 Potential to Achieve Goals: Good Progress towards PT goals: Progressing toward goals    Frequency  7X/week    PT Plan Current plan remains appropriate    Co-evaluation             End of Session Equipment Utilized During Treatment: Gait belt Activity Tolerance: Patient tolerated treatment well Patient left: in bed;with call bell/phone within reach     Time: 1258-1327 PT Time Calculation (min) (ACUTE ONLY): 29 min  Charges:  $Gait Training: 8-22 mins $Therapeutic Activity: 8-22 mins                    G Codes:      Tiffany Burke 03/29/2014, 2:35 PM

## 2014-03-29 NOTE — Evaluation (Signed)
Occupational Therapy Evaluation Patient Details Name: Tiffany Burke MRN: [id number] DOB: April 24, 1940 Today's Date: 03/29/2014    History of Present Illness s/p R TK revision   Clinical Impression   This 74 year old female was admitted for the above surgery.  She will benefit from skilled OT in acute to increase safety and independence with adls.  She needs min A for ADLs and transfers at this time.  Husband will help as needed with ADLs, but pt should progress to mod I quickly.  Pt was independent prior to admission    Follow Up Recommendations  Supervision/Assistance - 24 hour    Equipment Recommendations  None recommended by OT    Recommendations for Other Services       Precautions / Restrictions Precautions Precautions: Knee;Fall Restrictions Weight Bearing Restrictions: No      Mobility Bed Mobility Overal bed mobility: Needs Assistance Bed Mobility: Supine to Sit     Supine to sit: Min assist     General bed mobility comments: light min A for trunk from flat bed  Transfers Overall transfer level: Needs assistance Equipment used: Rolling walker (2 wheeled) Transfers: Sit to/from [employer] Sit to Stand: Min assist Stand pivot transfers: Min assist       General transfer comment: cues for UE/LE placement    Balance                                            ADL Overall ADL's : Needs assistance/impaired             Lower Body Bathing: Minimal assistance;Sit to/from stand       Lower Body Dressing: Minimal assistance;Sit to/from stand   Toilet Transfer: Minimal assistance;Stand-pivot (to recliner)             General ADL Comments: pt is able to complete UB adls with set up.  Unsteady when getting up and stepping over to recliner.  Reviewed shower sequence:  she did not have a walk in shower when she had last knee sx.  Will return to practice at another time     Vision                      Perception     Praxis      Pertinent Vitals/Pain Pain Assessment: 0-10 Pain Score: 3  Pain Location: R knee Pain Descriptors / Indicators: Sore Pain Intervention(s): Limited activity within patient's tolerance;Monitored during session;Premedicated before session;Repositioned     Hand Dominance     Extremity/Trunk Assessment Upper Extremity Assessment Upper Extremity Assessment: Defer to OT evaluation           Communication Communication Communication: No difficulties   Cognition Arousal/Alertness: Awake/alert Behavior During Therapy: WFL for tasks assessed/performed Overall Cognitive Status: Within Functional Limits for tasks assessed                     General Comments       Exercises       Shoulder Instructions      Home Living Family/patient expects to be discharged to:: Private residence Living Arrangements: Spouse/significant other                 Bathroom Shower/Tub: Producer, television/film/video: Handicapped height     Home Equipment: Bedside commode;Shower seat - built in;Walker - 2 wheels  Prior Functioning/Environment Level of Independence: Independent             OT Diagnosis: Generalized weakness   OT Problem List: Decreased strength;Decreased activity tolerance;Impaired balance (sitting and/or standing);Decreased knowledge of use of DME or AE;Pain   OT Treatment/Interventions: Self-care/ADL training;DME and/or AE instruction;Patient/family education;Balance training    OT Goals(Current goals can be found in the care plan section) Acute Rehab OT Goals Patient Stated Goal: get back to being independent OT Goal Formulation: With patient Time For Goal Achievement: 04/05/14 Potential to Achieve Goals: Good ADL Goals Pt Will Perform Grooming: with supervision;standing Pt Will Transfer to Toilet: with supervision;ambulating;bedside commode Pt Will Perform Toileting - Clothing Manipulation and hygiene: with  supervision;sit to/from stand Pt Will Perform Tub/Shower Transfer: Shower transfer;with min guard assist;ambulating;shower seat  OT Frequency: Min 2X/week   Barriers to D/C:            Co-evaluation              End of Session CPM Right Knee CPM Right Knee: Off  Activity Tolerance: Patient tolerated treatment well Patient left: in chair;with call bell/phone within reach   Time: 0753-0812 OT Time Calculation (min): 19 min Charges:  OT General Charges $OT Visit: 1 Procedure OT Evaluation $Initial OT Evaluation Tier I: 1 Procedure OT Treatments $Self Care/Home Management : 8-22 mins G-Codes:    Tiffany Burke 03/29/2014, 8:44 AM  Tiffany Burke, OTR/L [phone] 03/29/2014

## 2014-03-29 NOTE — Anesthesia Postprocedure Evaluation (Signed)
  Anesthesia Post-op Note  Patient: Tiffany Burke  Procedure(s) Performed: Procedure(s) (LRB): RIGHT KNEE POLYETHYLENE REVISION WITH FEMORAL BONE GRAFTING (Right)  Patient Location: PACU  Anesthesia Type: Spinal  Level of Consciousness: awake and alert   Airway and Oxygen Therapy: Patient Spontanous Breathing  Post-op Pain: mild  Post-op Assessment: Post-op Vital signs reviewed, Patient's Cardiovascular Status Stable, Respiratory Function Stable, Patent Airway and No signs of Nausea or vomiting  Last Vitals:  Filed Vitals:   03/29/14 0708  BP:   Pulse:   Temp:   Resp: 18    Post-op Vital Signs: stable   Complications: No apparent anesthesia complications

## 2014-03-29 NOTE — Op Note (Signed)
NAMHanley Hays:  Burke, Tiffany        ACCOUNT NO.:  [id number]  MEDICAL RECORD NO.:  [id number]  LOCATION:  1607                         [facility]  PHYSICIAN:  Ollen GrossFrank Caela Huot, M.D.    DATE OF BIRTH:  December 01, 1940  DATE OF PROCEDURE:  03/28/2014 DATE OF DISCHARGE:                              OPERATIVE REPORT   PREOPERATIVE DIAGNOSIS:  Failed right total knee arthroplasty.  POSTOPERATIVE DIAGNOSIS:  Failed right total knee arthroplasty.  PROCEDURE:  Right knee polyethylene revision with femoral bone grafting.  SURGEON:  Ollen GrossFrank Limmie Schoenberg, M.D.  ASSISTANT:  Alexzandrew L. Perkins, PA-C.  ANESTHESIA:  Spinal.  ESTIMATED BLOOD LOSS:  Minimal.  DRAINS:  Hemovac x1.  TOURNIQUET TIME:  39 minutes at 300 mmHg.  COMPLICATIONS:  None.  CONDITION:  Stable to recovery.  BRIEF CLINICAL NOTE:  [name] is a 74 year old female who had a right total knee arthroplasty done in [place] approximately 10 years ago.  She had done well and then recently over the past several months developed significant pain in the right knee with inability to bear weight.  Evaluation in the office with radiograph showed a large femoral lytic cyst and abnormal polyethylene wear with questionable fractured cyst.  She had a bone scan done, which showed increased uptake through that area, but did not show any definitive loosening of the prosthesis. Plain radiographs also did not show any definitive loosening.  She presents now for polyethylene versus total knee revision based on intraoperative findings.  PROCEDURE IN DETAIL:  After successful administration of spinal anesthetic, a tourniquet was placed high on her right thigh and her right lower extremity was prepped and draped in the usual sterile fashion.  Extremities wrapped in Esmarch, knee flexed, tourniquet inflated to 300 mmHg.  A midline incision was made with a 10-blade through subcutaneous tissue to the extensor mechanism.  A fresh  blade was used make a medial parapatellar arthrotomy.  Small amount of fluid was present in the joint, but tremendous amount of osteolytic tissue debris was present.  We completed the arthrotomy, elevated the soft tissue of the proximal medial tibia with a knife and into the semimembranosus bursa with a Cobb elevator.  We elevated the tissue laterally with attention being paid to avoid the patellar tendon on tibial tubercle.  The patella was everted and knee flexed 90 degrees. The osteolytic debris was then removed from the anterior inferior and anterior lateral portions of the joint.  We inspected the metal components and it looked in good position and intact.  I then removed the tibial polyethylene from the tibial tray.  There was a significant amount of backside or undersurface wear.  There was less articular surface wear and there was undersurface wear.  The tibial tray was inspected and I used a bone tamp to tap on the tray and it was very stable.  There were no lytic areas present on the x-rays and the bone prosthesis interface was intact throughout.  We then examined the femoral side.  The femoral component appears to be stable.  I cleared soft tissue around the edges of the component and used a bone tamp to see if the femoral component would move, but it did not.  We then  identified the area corresponding to where the cyst was on the x-ray.  This was around the condylar flare area on the medial side of the femur.  I made a small window and indeed there was a large cystic cavity there.  Fortunately, the femoral component was stable and intact. We curetted the cyst out and then thoroughly irrigated this.  We then packed this graft with cancellous allograft using about 20 mL of cancellous allograft.  This fully packed into the defect.  We then thoroughly irrigated the knee.  Complete synovectomy was then performed to remove the rest of the osteolytic debris.  I then trialed a  tibial polyethylene and went up to 15 mm, which allowed for full extension with excellent varus-valgus and anterior-posterior balance.  Removed the trial and placed the permanent 15 mm fixed bearing tibial polyethylene for a 2.5 fixed bearing Sigma tibial tray.  The knee was reduced with excellent stability throughout full range of motion.  The patellar component was again inspected and that looked fine.  Wound was then copiously irrigated with saline solution.  The extensor mechanism as well as the subcutaneous tissues and the periosteum of the femur then injected with a total of 20 mL of Exparel mixed with 40 mL of saline. Additional 20 mL of 0.25% Marcaine was injected into the same tissues. Wound was further irrigated and the arthrotomy closed over Hemovac drain with a running #1 V-Loc suture.  Flexion against gravity was 130 degrees.  Patella tracks normally.  We then injected 2 g of tranexamic acid and mixed with 50 mL of saline into the joint.  I then released the tourniquet for a total tourniquet time of 39 minutes.  Subcu tissues were then closed with interrupted 2-0 Vicryl and subcuticular running 4- 0 Monocryl.  The incision was then cleaned and dried and Steri-Strips and a bulky sterile dressing applied.  Right lower extremity was then placed into a knee immobilizer and the patient was awakened and transported to recovery in stable condition.  Note that a surgical assistant was a medical necessity for this procedure to do it in a safe and expeditious manner.  Surgical assistant was necessary for retraction of ligaments and vital neurovascular structures as well as proper positioning of the limb to do the bone grafting and to remove the old implant placed in it.     Ollen Gross, M.D.     FA/MEDQ  D:  03/28/2014  T:  03/29/2014  Job:  [id number]

## 2014-03-29 NOTE — Evaluation (Signed)
Physical Therapy Evaluation Patient Details Name: Sianna StubblefMerril Abbeield MRN: [id number] DOB: 1940-09-27 Today's Date: 03/29/2014   History of Present Illness  s/p R TK revision  Clinical Impression  Pt s/p R TKR presents with decreased R LE strength/ROM and post op pain limiting functional mobility.  Pt should progress well to d.c home with family assist and HHPT follow up.    Follow Up Recommendations Home health PT    Equipment Recommendations  None recommended by PT    Recommendations for Other Services OT consult     Precautions / Restrictions Precautions Precautions: Knee;Fall Required Braces or Orthoses: Knee Immobilizer - Right Knee Immobilizer - Right: Discontinue once straight leg raise with < 10 degree lag (did IND SLR this am) Restrictions Weight Bearing Restrictions: No      Mobility  Bed Mobility               General bed mobility comments: NT - OOB with OT - pt delcines return to bed at this time  Transfers Overall transfer level: Needs assistance Equipment used: Rolling walker (2 wheeled) Transfers: Sit to/from Stand Sit to Stand: Min assist         General transfer comment: cues for LE management and use of UEs to self assist  Ambulation/Gait Ambulation/Gait assistance: Min assist Ambulation Distance (Feet): 60 Feet Assistive device: Rolling walker (2 wheeled) Gait Pattern/deviations: Step-to pattern;Step-through pattern;Decreased step length - right;Decreased step length - left;Shuffle;Trunk flexed Gait velocity: decr   General Gait Details: cues for sequence, posture and position from [employer]  Stairs            Wheelchair Mobility    Modified Rankin (Stroke Patients Only)       Balance                                             Pertinent Vitals/Pain Pain Assessment: 0-10 Pain Score: 5  Pain Location: R knee Pain Descriptors / Indicators: Aching;Sore Pain Intervention(s): Limited activity within patient's  tolerance;Monitored during session;Premedicated before session;Ice applied    Home Living Family/patient expects to be discharged to:: Private residence Living Arrangements: Spouse/significant other Available Help at Discharge: Family Type of Home: House Home Access: Stairs to enter Entrance Stairs-Rails: Right;Left;Can reach both Entrance Stairs-Number of Steps: 6 Home Layout: One level Home Equipment: Bedside commode;Shower seat - built in;Walker - 2 wheels      Prior Function Level of Independence: Independent               Hand Dominance        Extremity/Trunk Assessment   Upper Extremity Assessment: Overall WFL for tasks assessed           Lower Extremity Assessment: RLE deficits/detail RLE Deficits / Details: 3/5 quads with AAROM at knee -10 - 80    Cervical / Trunk Assessment: Normal  Communication   Communication: No difficulties  Cognition Arousal/Alertness: Awake/alert Behavior During Therapy: WFL for tasks assessed/performed Overall Cognitive Status: Within Functional Limits for tasks assessed                      General Comments      Exercises Total Joint Exercises Ankle Circles/Pumps: AROM;Both;15 reps;Supine Quad Sets: AROM;Both;10 reps;Supine Heel Slides: AAROM;15 reps;Right;Supine Straight Leg Raises: AAROM;AROM;Right;10 reps;Supine      Assessment/Plan    PT Assessment Patient needs continued PT services  PT Diagnosis Difficulty walking   PT Problem List Decreased strength;Decreased range of motion;Decreased activity tolerance;Decreased mobility;Decreased knowledge of use of DME;Pain  PT Treatment Interventions DME instruction;Gait training;Stair training;Functional mobility training;Therapeutic activities;Therapeutic exercise;Patient/family education   PT Goals (Current goals can be found in the Care Plan section) Acute Rehab PT Goals Patient Stated Goal: get back to being independent PT Goal Formulation: With  patient Time For Goal Achievement: 04/03/14 Potential to Achieve Goals: Good    Frequency 7X/week   Barriers to discharge        Co-evaluation               End of Session Equipment Utilized During Treatment: Gait belt Activity Tolerance: Patient tolerated treatment well Patient left: in chair;with call bell/phone within reach;with family/visitor present Nurse Communication: Mobility status         Time: 0935-1003 PT Time Calculation (min) (ACUTE ONLY): 28 min   Charges:   PT Evaluation $Initial PT Evaluation Tier I: 1 Procedure PT Treatments $Gait Training: 8-22 mins $Therapeutic Exercise: 8-22 mins   PT G Codes:        Arnetra Terris Apr 17, 2014, 12:09 PM

## 2014-03-29 NOTE — Care Management Note (Signed)
    Page 1 of 2   03/29/2014     10:15:53 AM CARE MANAGEMENT NOTE 03/29/2014  Patient:  Mule,Mersades   Account Number:  [id number]  Date Initiated:  03/29/2014  Documentation initiated by:  [facility]  Subjective/Objective Assessment:   adm: RIGHT KNEE POLYETHYLENE REVISION WITH FEMORAL BONE GRAFTING (Right)     Action/Plan:   discharge planning   Anticipated DC Date:  03/30/2014   Anticipated DC Plan:  [place]  CM consult      [facility] Choice  HOME HEALTH   Choice offered to / List presented to:  C-1 Patient   DME arranged  NA      DME agency  NA     [place] arranged  HH-2 PT      [place]   Status of service:  Completed, signed off Medicare Important Message given?   (If response is "NO", the following Medicare IM given date fields will be blank) Date Medicare IM given:   Medicare IM given by:   Date Additional Medicare IM given:   Additional Medicare IM given by:    Discharge Disposition:  [place]  Per UR Regulation:    If discussed at Long Length of Stay Meetings, dates discussed:    Comments:  03/29/13 07:45 CM met with pt in room to offer choice of home health agency.  Pt chooses [facility] to render HHPT.  address and contact information verified with pt.  NO DME is needed as pt has all at home.  Referral called to [employer], Tim.  No other CM needs were communicated. Mariane Masters, BSN, [place].

## 2014-03-29 NOTE — Progress Notes (Signed)
Subjective: 1 Day Post-Op Procedure(s) (LRB): RIGHT KNEE POLYETHYLENE REVISION WITH FEMORAL BONE GRAFTING (Right) Patient reports pain as mild.   Patient seen in rounds with Dr. Lequita Halt.  She is doing well this morning.  Pain is under control. Patient is well, and has had no acute complaints or problems We will start therapy today.  Plan is to go Home after hospital stay.  Objective: Vital signs in last 24 hours: Temp:  [97.6 F (36.4 C)-98.5 F (36.9 C)] 98.5 F (36.9 C) (01/07 0524) Pulse Rate:  [58-72] 67 (01/07 0524) Resp:  [14-25] 18 (01/07 0708) BP: (92-157)/(37-92) 120/65 mmHg (01/07 0524) SpO2:  [94 %-100 %] 99 % (01/07 0708) Weight:  [92.534 kg (204 lb)] 92.534 kg (204 lb) (01/06 1032)  Intake/Output from previous day:  Intake/Output Summary (Last 24 hours) at 03/29/14 0721 Last data filed at 03/29/14 0600  Gross per 24 hour  Intake 3644.58 ml  Output   3315 ml  Net 329.58 ml     Labs:  Recent Labs  03/29/14 0450  HGB 12.7    Recent Labs  03/29/14 0450  WBC 7.3  RBC 3.86*  HCT 36.4  PLT 107*    Recent Labs  03/29/14 0450  NA 138  K 4.0  CL 102  CO2 28  BUN 15  CREATININE 0.74  GLUCOSE 174*  CALCIUM 9.1   No results for input(s): LABPT, INR in the last 72 hours.  EXAM General - Patient is Alert, Appropriate and Oriented Extremity - Neurovascular intact Sensation intact distally Dorsiflexion/Plantar flexion intact Dressing - dressing C/D/I Motor Function - intact, moving foot and toes well on exam.  Hemovac pulled without difficulty.  Past Medical History  Diagnosis Date  . Hypertension     a. Treated x 10-15 yrs  . Hypercholesteremia     a. Treated x 5 yrs   . OCD (obsessive compulsive disorder)   . Colon polyps   . Osteoarthritis     a. s/p R TKA 2003;  b. s/p L TKA 2007.  . Carotid artery disease     a. 09/2011 Carotid U/S: Eccentric plaque bilat carotid bulbs & origin of bilat ICA's->50-69% on right with borderline  elvation of velocities on left.  Degree of narrowing appeared more severe than acquired by velocities.  . Chest pain     a. admx with CP 11/13 => Lexiscan MV 01/25/12:  No ischemia, EF 72%.    . Ejection Fraction     a. Echo 01/25/12: mild LVH, EF 55-60%, Gr 1 diast dysfn.   . OSA on CPAP   . Anginal pain     "went to ED-complete work up and everything was fine"  . Borderline diabetes   . Anxiety   . Depression   . History of kidney stones     none since age 10  . GERD (gastroesophageal reflux disease)     Assessment/Plan: 1 Day Post-Op Procedure(s) (LRB): RIGHT KNEE POLYETHYLENE REVISION WITH FEMORAL BONE GRAFTING (Right) Principal Problem:   Failed total right knee replacement Active Problems:   OA (osteoarthritis) of knee  Estimated body mass index is 37.3 kg/(m^2) as calculated from the following:   Height as of this encounter:  (1.575 m).   Weight as of this encounter: 92.534 kg (204 lb). Advance diet Up with therapy Plan for discharge tomorrow Discharge home with home health  DVT Prophylaxis - Xarelto Weight-Bearing as tolerated to right leg D/C O2 and Pulse OX and try on Room Air  Avel Peacerew Vickii Volland, PA-C Orthopaedic Surgery 03/29/2014, 7:21 AM

## 2014-03-29 NOTE — Anesthesia Procedure Notes (Signed)
Spinal Patient location during procedure: OR Staffing Anesthesiologist: Delesia Martinek Performed by: anesthesiologist  Preanesthetic Checklist Completed: patient identified, site marked, surgical consent, pre-op evaluation, timeout performed, IV checked, risks and benefits discussed and monitors and equipment checked Spinal Block Patient position: sitting Prep: Betadine Patient monitoring: heart rate, continuous pulse ox and blood pressure Approach: right paramedian Location: L4-5 Injection technique: single-shot Needle Needle type: Spinocan  Needle gauge: 22 G Needle length: 9 cm Additional Notes Expiration date of kit checked and confirmed. Patient tolerated procedure well, without complications.     

## 2014-03-30 LAB — BASIC METABOLIC PANEL
ANION GAP: 9 (ref 5–15)
BUN: 17 mg/dL (ref 6–23)
CHLORIDE: 99 meq/L (ref 96–112)
CO2: 31 mmol/L (ref 19–32)
CREATININE: 0.71 mg/dL (ref 0.50–1.10)
Calcium: 9.4 mg/dL (ref 8.4–10.5)
GFR calc Af Amer: >90 mL/min (ref >90)
GFR calc non Af Amer: 85 mL/min — ABNORMAL LOW (ref >90)
Glucose, Bld: 172 mg/dL — ABNORMAL HIGH (ref 70–99)
POTASSIUM: 3.4 mmol/L — AB (ref 3.5–5.1)
Sodium: 139 mmol/L (ref 135–145)

## 2014-03-30 LAB — CBC
HEMATOCRIT: 36.5 % (ref 36.0–46.0)
Hemoglobin: 13 g/dL (ref 12.0–15.0)
MCH: 33.8 pg (ref 26.0–34.0)
MCHC: 35.6 g/dL (ref 30.0–36.0)
MCV: 94.8 fL (ref 78.0–100.0)
Platelets: 124 10*3/uL — ABNORMAL LOW (ref 150–400)
RBC: 3.85 MIL/uL — ABNORMAL LOW (ref 3.87–5.11)
RDW: 12.3 % (ref 11.5–15.5)
WBC: 10.4 10*3/uL (ref 4.0–10.5)

## 2014-03-30 MED ORDER — METHOCARBAMOL 500 MG PO TABS: 500.0000 mg | ORAL_TABLET | Freq: Four times a day (QID) | ORAL | Status: DC | PRN

## 2014-03-30 MED ORDER — RIVAROXABAN 10 MG PO TABS: 10.0000 mg | ORAL_TABLET | Freq: Every day | ORAL | Status: DC

## 2014-03-30 MED ORDER — OXYCODONE HCL 5 MG PO TABS: 5.0000 mg | ORAL_TABLET | ORAL | Status: DC | PRN

## 2014-03-30 MED ORDER — TRAMADOL HCL 50 MG PO TABS: 50.0000 mg | ORAL_TABLET | Freq: Four times a day (QID) | ORAL | Status: DC | PRN

## 2014-03-30 NOTE — Discharge Summary (Signed)
Physician Discharge Summary   Patient ID: Tiffany Burke MRN: [id number] DOB/AGE: 1941/03/07 74 y.o.  Admit date: 03/28/2014 Discharge date: 03-30-2014  Primary Diagnosis:  Failed right total knee arthroplasty.  Admission Diagnoses:  Past Medical History  Diagnosis Date  . Hypertension     a. Treated x 10-15 yrs  . Hypercholesteremia     a. Treated x 5 yrs   . OCD (obsessive compulsive disorder)   . Colon polyps   . Osteoarthritis     a. s/p R TKA 2003;  b. s/p L TKA 2007.  . Carotid artery disease     a. 09/2011 Carotid U/S: Eccentric plaque bilat carotid bulbs & origin of bilat ICA's->50-69% on right with borderline elvation of velocities on left.  Degree of narrowing appeared more severe than acquired by velocities.  . Chest pain     a. admx with CP 11/13 => Lexiscan MV 01/25/12:  No ischemia, EF 72%.    . Ejection Fraction     a. Echo 01/25/12: mild LVH, EF 55-60%, Gr 1 diast dysfn.   . OSA on CPAP   . Anginal pain     "went to ED-complete work up and everything was fine"  . Borderline diabetes   . Anxiety   . Depression   . History of kidney stones     none since age 84  . GERD (gastroesophageal reflux disease)    Discharge Diagnoses:   Principal Problem:   Failed total right knee replacement Active Problems:   OA (osteoarthritis) of knee  Estimated body mass index is 37.3 kg/(m^2) as calculated from the following:   Height as of this encounter: 5' 2"  (1.575 m).   Weight as of this encounter: 92.534 kg (204 lb).  Procedure:  Procedure(s) (LRB): RIGHT KNEE POLYETHYLENE REVISION WITH FEMORAL BONE GRAFTING (Right)   Consults: None  HPI: Ms. Lineberry is a 74 year old female who had a right total knee arthroplasty done in [place] approximately 10 years ago. She had done well and then recently over the past several months developed significant pain in the right knee with inability to bear weight. Evaluation in the office with radiograph showed a large  femoral lytic cyst and abnormal polyethylene wear with questionable fractured cyst. She had a bone scan done, which showed increased uptake through that area, but did not show any definitive loosening of the prosthesis. Plain radiographs also did not show any definitive loosening. She presents now for polyethylene versus total knee revision based on intraoperative findings.  Laboratory Data: Admission on 03/28/2014  Component Date Value Ref Range Status  . ABO/RH(D) 03/28/2014 A POS   Final  . Antibody Screen 03/28/2014 NEG   Final  . Sample Expiration 03/28/2014 03/31/2014   Final  . ABO/RH(D) 03/28/2014 A POS   Final  . WBC 03/29/2014 7.3  4.0 - 10.5 K/uL Final  . RBC 03/29/2014 3.86* 3.87 - 5.11 MIL/uL Final  . Hemoglobin 03/29/2014 12.7  12.0 - 15.0 g/dL Final  . HCT 03/29/2014 36.4  36.0 - 46.0 % Final  . MCV 03/29/2014 94.3  78.0 - 100.0 fL Final  . MCH 03/29/2014 32.9  26.0 - 34.0 pg Final  . MCHC 03/29/2014 34.9  30.0 - 36.0 g/dL Final  . RDW 03/29/2014 12.0  11.5 - 15.5 % Final  . Platelets 03/29/2014 107* 150 - 400 K/uL Final   Comment: SPECIMEN CHECKED FOR CLOTS REPEATED TO VERIFY PLATELET COUNT CONFIRMED BY SMEAR   . Sodium 03/29/2014 138  135 - 145  mmol/L Final   Please note change in reference range.  . Potassium 03/29/2014 4.0  3.5 - 5.1 mmol/L Final   Please note change in reference range.  . Chloride 03/29/2014 102  96 - 112 mEq/L Final  . CO2 03/29/2014 28  19 - 32 mmol/L Final  . Glucose, Bld 03/29/2014 174* 70 - 99 mg/dL Final  . BUN 03/29/2014 15  6 - 23 mg/dL Final  . Creatinine, Ser 03/29/2014 0.74  0.50 - 1.10 mg/dL Final  . Calcium 03/29/2014 9.1  8.4 - 10.5 mg/dL Final  . GFR calc non Af Amer 03/29/2014 84* >90 mL/min Final  . GFR calc Af Amer 03/29/2014 >90  >90 mL/min Final   Comment: (NOTE) The eGFR has been calculated using the CKD EPI equation. This calculation has not been validated in all clinical situations. eGFR's persistently <90 mL/min  signify possible Chronic Kidney Disease.   . Anion gap 03/29/2014 8  5 - 15 Final  . WBC 03/30/2014 10.4  4.0 - 10.5 K/uL Final  . RBC 03/30/2014 3.85* 3.87 - 5.11 MIL/uL Final  . Hemoglobin 03/30/2014 13.0  12.0 - 15.0 g/dL Final  . HCT 03/30/2014 36.5  36.0 - 46.0 % Final  . MCV 03/30/2014 94.8  78.0 - 100.0 fL Final  . MCH 03/30/2014 33.8  26.0 - 34.0 pg Final  . MCHC 03/30/2014 35.6  30.0 - 36.0 g/dL Final  . RDW 03/30/2014 12.3  11.5 - 15.5 % Final  . Platelets 03/30/2014 124* 150 - 400 K/uL Final  . Sodium 03/30/2014 139  135 - 145 mmol/L Final   Please note change in reference range.  . Potassium 03/30/2014 3.4* 3.5 - 5.1 mmol/L Final   Please note change in reference range.  . Chloride 03/30/2014 99  96 - 112 mEq/L Final  . CO2 03/30/2014 31  19 - 32 mmol/L Final  . Glucose, Bld 03/30/2014 172* 70 - 99 mg/dL Final  . BUN 03/30/2014 17  6 - 23 mg/dL Final  . Creatinine, Ser 03/30/2014 0.71  0.50 - 1.10 mg/dL Final  . Calcium 03/30/2014 9.4  8.4 - 10.5 mg/dL Final  . GFR calc non Af Amer 03/30/2014 85* >90 mL/min Final  . GFR calc Af Amer 03/30/2014 >90  >90 mL/min Final   Comment: (NOTE) The eGFR has been calculated using the CKD EPI equation. This calculation has not been validated in all clinical situations. eGFR's persistently <90 mL/min signify possible Chronic Kidney Disease.   Georgiann Hahn gap 03/30/2014 9  5 - 15 Final  Hospital Outpatient Visit on 03/20/2014  Component Date Value Ref Range Status  . aPTT 03/20/2014 34  24 - 37 seconds Final  . WBC 03/20/2014 4.8  4.0 - 10.5 K/uL Final  . RBC 03/20/2014 4.42  3.87 - 5.11 MIL/uL Final  . Hemoglobin 03/20/2014 14.4  12.0 - 15.0 g/dL Final  . HCT 03/20/2014 42.7  36.0 - 46.0 % Final  . MCV 03/20/2014 96.6  78.0 - 100.0 fL Final  . MCH 03/20/2014 32.6  26.0 - 34.0 pg Final  . MCHC 03/20/2014 33.7  30.0 - 36.0 g/dL Final  . RDW 03/20/2014 12.3  11.5 - 15.5 % Final  . Platelets 03/20/2014 140* 150 - 400 K/uL Final  .  Prothrombin Time 03/20/2014 14.2  11.6 - 15.2 seconds Final  . INR 03/20/2014 1.09  0.00 - 1.49 Final  . Color, Urine 03/20/2014 YELLOW  YELLOW Final  . APPearance 03/20/2014 CLEAR  CLEAR Final  .  Specific Gravity, Urine 03/20/2014 1.014  1.005 - 1.030 Final  . pH 03/20/2014 5.5  5.0 - 8.0 Final  . Glucose, UA 03/20/2014 NEGATIVE  NEGATIVE mg/dL Final  . Hgb urine dipstick 03/20/2014 NEGATIVE  NEGATIVE Final  . Bilirubin Urine 03/20/2014 NEGATIVE  NEGATIVE Final  . Ketones, ur 03/20/2014 NEGATIVE  NEGATIVE mg/dL Final  . Protein, ur 03/20/2014 NEGATIVE  NEGATIVE mg/dL Final  . Urobilinogen, UA 03/20/2014 0.2  0.0 - 1.0 mg/dL Final  . Nitrite 03/20/2014 NEGATIVE  NEGATIVE Final  . Leukocytes, UA 03/20/2014 MODERATE* NEGATIVE Final  . MRSA, PCR 03/20/2014 NEGATIVE  NEGATIVE Final  . Staphylococcus aureus 03/20/2014 NEGATIVE  NEGATIVE Final   Comment:        The Xpert SA Assay (FDA approved for NASAL specimens in patients over 53 years of age), is one component of a comprehensive surveillance program.  Test performance has been validated by [employer] for patients greater than or equal to 56 year old. It is not intended to diagnose infection nor to guide or monitor treatment.   . Squamous Epithelial / LPF 03/20/2014 RARE  RARE Final  . WBC, UA 03/20/2014 0-2  <3 WBC/hpf Final  . Bacteria, UA 03/20/2014 RARE  RARE Final     X-Rays:No results found.  EKG: Orders placed or performed in visit on 03/20/14  . EKG 12-Lead     Hospital Course: Toshua Honsinger is a 74 y.o. who was admitted to [facility]. They were brought to the operating room on 03/28/2014 and underwent Procedure(s): [place].  Patient tolerated the procedure well and was later transferred to the recovery room and then to the orthopaedic floor for postoperative care.  They were given PO and IV analgesics for pain control following their surgery.  They  were given 24 hours of postoperative antibiotics of  Anti-infectives    Start     Dose/Rate Route Frequency Ordered Stop   03/28/14 1800  clindamycin (CLEOCIN) IVPB 600 mg     600 mg100 mL/hr over 30 Minutes Intravenous Every 6 hours 03/28/14 1544 03/29/14 0025   03/28/14 0958  clindamycin (CLEOCIN) IVPB 900 mg     900 mg100 mL/hr over 30 Minutes Intravenous On call to O.R. 03/28/14 8563 03/28/14 1236     and started on DVT prophylaxis in the form of Xarelto.   PT and OT were ordered for total joint protocol.  Discharge planning consulted to help with postop disposition and equipment needs.  Patient had a decent night on the evening of surgery.  They started to get up OOB with therapy on day one. Hemovac drain was pulled without difficulty.  Continued to work with therapy into day two.  Dressing was changed on day two and the incision was healing well and looked very good. Patient was seen in rounds and was ready to go home on day two.   Diet: Cardiac diet Activity:WBAT Follow-up:in 2 weeks on Tuesday the 19th Disposition - Home Discharged Condition: good   Discharge Instructions    Call MD / Call 911    Complete by:  As directed   If you experience chest pain or shortness of breath, CALL 911 and be transported to the hospital emergency room.  If you develope a fever above 101 F, pus (white drainage) or increased drainage or redness at the wound, or calf pain, call your surgeon's office.     Change dressing    Complete by:  As directed  Change dressing daily with sterile 4 x 4 inch gauze dressing and apply TED hose. Do not submerge the incision under water.     Constipation Prevention    Complete by:  As directed   Drink plenty of fluids.  Prune juice may be helpful.  You may use a stool softener, such as Colace (over the counter) 100 mg twice a day.  Use MiraLax (over the counter) for constipation as needed.     Diet - low sodium heart healthy    Complete by:  As directed       Discharge instructions    Complete by:  As directed   Pick up stool softner and laxative for home use following surgery while on pain medications. Do not submerge incision under water. Please use good hand washing techniques while changing dressing each day. May shower starting three days after surgery. Please use a clean towel to pat the incision dry following showers. Continue to use ice for pain and swelling after surgery. Do not use any lotions or creams on the incision until instructed by your surgeon.  Take Xarelto for two and a half more weeks, then discontinue Xarelto. Once the patient has completed the Xarelto, they may resume the 81 mg Aspirin.     Do not put a pillow under the knee. Place it under the heel.    Complete by:  As directed      Do not sit on low chairs, stoools or toilet seats, as it may be difficult to get up from low surfaces    Complete by:  As directed      Driving restrictions    Complete by:  As directed   No driving until released by the physician.     Increase activity slowly as tolerated    Complete by:  As directed      Lifting restrictions    Complete by:  As directed   No lifting until released by the physician.     Patient may shower    Complete by:  As directed   You may shower without a dressing once there is no drainage.  Do not wash over the wound.  If drainage remains, do not shower until drainage stops.     TED hose    Complete by:  As directed   Use stockings (TED hose) for 3 weeks on both leg(s).  You may remove them at night for sleeping.     Weight bearing as tolerated    Complete by:  As directed   Laterality:  right  Extremity:  Lower            Medication List    STOP taking these medications        aspirin 81 MG tablet     ergocalciferol 50000 UNITS capsule  Commonly known as:  VITAMIN D2     multivitamin with minerals Tabs tablet     naproxen sodium 220 MG tablet  Commonly known as:  ANAPROX     PROBIOTIC DAILY PO       TAKE these medications        acetaminophen 325 MG tablet  Commonly known as:  TYLENOL  Take 650 mg by mouth every 6 (six) hours as needed.     atorvastatin 40 MG tablet  Commonly known as:  LIPITOR  Take 40 mg by mouth at bedtime.     citalopram 40 MG tablet  Commonly known as:  CELEXA  Take 40 mg by  mouth every morning.     clobetasol 0.05 % external solution  Commonly known as:  TEMOVATE  Apply 1 application topically as needed.     diphenhydrAMINE 25 MG tablet  Commonly known as:  BENADRYL  Take 25 mg by mouth daily as needed for allergies (in the am).     hydrochlorothiazide 12.5 MG capsule  Commonly known as:  MICROZIDE  Take 12.5 mg by mouth every morning.     methocarbamol 500 MG tablet  Commonly known as:  ROBAXIN  Take 1 tablet (500 mg total) by mouth every 6 (six) hours as needed for muscle spasms.     metoprolol 100 MG tablet  Commonly known as:  LOPRESSOR  Take 100 mg by mouth 2 (two) times daily.     NITROSTAT 0.4 MG SL tablet  Generic drug:  nitroGLYCERIN  PLACE 1 TABLET UNDER THE TONGUE EVERY 5 MINUTES X 3 DOSES AS NEEDED FOR CHEST PAIN     omeprazole 20 MG capsule  Commonly known as:  PRILOSEC  Take 20 mg by mouth every morning.     oxyCODONE 5 MG immediate release tablet  Commonly known as:  Oxy IR/ROXICODONE  Take 1-2 tablets (5-10 mg total) by mouth every 3 (three) hours as needed for moderate pain, severe pain or breakthrough pain.     rivaroxaban 10 MG Tabs tablet  Commonly known as:  XARELTO  - Take 1 tablet (10 mg total) by mouth daily with breakfast. .Take Xarelto for two and a half more weeks, then discontinue Xarelto.  - Once the patient has completed the Xarelto, they may resume the 81 mg Aspirin.     traMADol 50 MG tablet  Commonly known as:  ULTRAM  Take 1-2 tablets (50-100 mg total) by mouth every 6 (six) hours as needed (mild pain).           Follow-up Information    Follow up with [facility]   Why:  home  health physical therapy   Contact information:   [place] [address] [phone]       Follow up with Gearlean Alf, MD. Schedule an appointment as soon as possible for a visit on 04/10/2014.   Specialty:  Orthopedic Surgery   Why:  Call office at [phone] to setup appointment ontuesday the 19th with Dr. Wynelle Link.   Contact information:   [address] [place] [address] [phone]       Signed: Arlee Muslim, PA-C Orthopaedic Surgery 03/30/2014, 8:58 AM

## 2014-03-30 NOTE — Progress Notes (Signed)
OT Cancellation Note  Patient Details Name: Tiffany AbbeKathryn Burke MRN: [id number] DOB: 08-02-1940   Cancelled Treatment:    Reason Eval/Treat Not Completed: Other (comment).  Pt comfortable with commode transfers and didn't feel she needed to practice shower--has done stairs with PT.  Will sign off.  [name] 03/30/2014, 10:20 AM  Tiffany OtterMaryellen Charline Burke, OTR/L [phone] 03/30/2014

## 2014-03-30 NOTE — Progress Notes (Signed)
Physical Therapy Treatment Patient Details Name: Tiffany AbbeKathryn Buechner MRN: [id number] DOB: 07-23-1940 Today's Date: 03/30/2014    History of Present Illness s/p R TK revision    PT Comments    Pt progressing well and eager for d.c home  Follow Up Recommendations  Home health PT     Equipment Recommendations  None recommended by PT    Recommendations for Other Services OT consult     Precautions / Restrictions Precautions Precautions: Knee;Fall Restrictions Weight Bearing Restrictions: No    Mobility  Bed Mobility Overal bed mobility: Needs Assistance Bed Mobility: Supine to Sit       Sit to supine: Supervision   General bed mobility comments: min cues for sequence and use of L LE to self assist  Transfers Overall transfer level: Needs assistance Equipment used: Rolling walker (2 wheeled) Transfers: Sit to/from Stand Sit to Stand: Supervision         General transfer comment: cues for LE management and use of UEs to self assist  Ambulation/Gait Ambulation/Gait assistance: Min guard;Supervision Ambulation Distance (Feet): 159 Feet Assistive device: Rolling walker (2 wheeled) Gait Pattern/deviations: Step-to pattern;Step-through pattern;Shuffle;Trunk flexed Gait velocity: decr   General Gait Details: cues for sequence, posture and position from RW   Stairs Stairs: Yes Stairs assistance: Min assist Stair Management: Two rails;Step to pattern;Forwards Number of Stairs: 5 General stair comments: cues for sequence  Wheelchair Mobility    Modified Rankin (Stroke Patients Only)       Balance                                    Cognition Arousal/Alertness: Awake/alert Behavior During Therapy: WFL for tasks assessed/performed Overall Cognitive Status: Within Functional Limits for tasks assessed                      Exercises Total Joint Exercises Ankle Circles/Pumps: AROM;Both;15 reps;Supine Quad Sets: AROM;Both;Supine;15  reps Heel Slides: AAROM;Right;Supine;20 reps Straight Leg Raises: AROM;Right;Supine;20 reps Long Arc Quad: Right;15 reps;Seated    General Comments        Pertinent Vitals/Pain Pain Assessment: 0-10 Pain Score: 3  Pain Location: R knee Pain Descriptors / Indicators: Aching;Sore Pain Intervention(s): Limited activity within patient's tolerance;Monitored during session;Premedicated before session;Ice applied    Home Living                      Prior Function            PT Goals (current goals can now be found in the care plan section) Acute Rehab PT Goals Patient Stated Goal: get back to being independent PT Goal Formulation: With patient Time For Goal Achievement: 04/03/14 Potential to Achieve Goals: Good Progress towards PT goals: Progressing toward goals    Frequency  7X/week    PT Plan Current plan remains appropriate    Co-evaluation             End of Session Equipment Utilized During Treatment: Gait belt Activity Tolerance: Patient tolerated treatment well Patient left: with call bell/phone within reach;in chair     Time: 0827-0910 PT Time Calculation (min) (ACUTE ONLY): 43 min  Charges:  $Gait Training: 23-37 mins $Therapeutic Exercise: 8-22 mins                    G Codes:      Fionn Stracke 03/30/2014, 11:43 AM

## 2014-03-30 NOTE — Progress Notes (Signed)
Subjective: 2 Days Post-Op Procedure(s) (LRB): RIGHT KNEE POLYETHYLENE REVISION WITH FEMORAL BONE GRAFTING (Right) Patient reports pain as mild.   Patient seen in rounds with Dr. Lequita HaltAluisio. Sitting up in bed already dressed and ready to go home. Patient is well, and has had no acute complaints or problems Patient is ready to go home today.  Objective: Vital signs in last 24 hours: Temp:  [98.1 F (36.7 C)-99.3 F (37.4 C)] 98.3 F (36.8 C) (01/08 0450) Pulse Rate:  [66-82] 77 (01/08 0450) Resp:  [12-16] 12 (01/08 0450) BP: (85-129)/(42-72) 119/72 mmHg (01/08 0450) SpO2:  [94 %-99 %] 95 % (01/08 0450)  Intake/Output from previous day:  Intake/Output Summary (Last 24 hours) at 03/30/14 0843 Last data filed at 03/30/14 0450  Gross per 24 hour  Intake    600 ml  Output   2575 ml  Net  -1975 ml    Labs:  Recent Labs  03/29/14 0450 03/30/14 0511  HGB 12.7 13.0    Recent Labs  03/29/14 0450 03/30/14 0511  WBC 7.3 10.4  RBC 3.86* 3.85*  HCT 36.4 36.5  PLT 107* 124*    Recent Labs  03/29/14 0450 03/30/14 0511  NA 138 139  K 4.0 3.4*  CL 102 99  CO2 28 31  BUN 15 17  CREATININE 0.74 0.71  GLUCOSE 174* 172*  CALCIUM 9.1 9.4   No results for input(s): LABPT, INR in the last 72 hours.  EXAM: General - Patient is Alert, Appropriate and Oriented Extremity - Neurovascular intact Sensation intact distally Dorsiflexion/Plantar flexion intact Incision - clean, dry, no drainage, healing Motor Function - intact, moving foot and toes well on exam.   Assessment/Plan: 2 Days Post-Op Procedure(s) (LRB): RIGHT KNEE POLYETHYLENE REVISION WITH FEMORAL BONE GRAFTING (Right) Procedure(s) (LRB): RIGHT KNEE POLYETHYLENE REVISION WITH FEMORAL BONE GRAFTING (Right) Past Medical History  Diagnosis Date  . Hypertension     a. Treated x 10-15 yrs  . Hypercholesteremia     a. Treated x 5 yrs   . OCD (obsessive compulsive disorder)   . Colon polyps   . Osteoarthritis     a. s/p R TKA 2003;  b. s/p L TKA 2007.  . Carotid artery disease     a. 09/2011 Carotid U/S: Eccentric plaque bilat carotid bulbs & origin of bilat ICA's->50-69% on right with borderline elvation of velocities on left.  Degree of narrowing appeared more severe than acquired by velocities.  . Chest pain     a. admx with CP 11/13 => Lexiscan MV 01/25/12:  No ischemia, EF 72%.    . Ejection Fraction     a. Echo 01/25/12: mild LVH, EF 55-60%, Gr 1 diast dysfn.   . OSA on CPAP   . Anginal pain     "went to ED-complete work up and everything was fine"  . Borderline diabetes   . Anxiety   . Depression   . History of kidney stones     none since age 74  . GERD (gastroesophageal reflux disease)    Principal Problem:   Failed total right knee replacement Active Problems:   OA (osteoarthritis) of knee  Estimated body mass index is 37.3 kg/(m^2) as calculated from the following:   Height as of this encounter: 5\' 2"  (1.575 m).   Weight as of this encounter: 92.534 kg (204 lb). Up with therapy Discharge home with home health Diet - Cardiac diet Follow up - in 2 weeks on Tuesday the 19th Activity - WBAT  Disposition - Home Condition Upon Discharge - Good D/C Meds - See DC Summary DVT Prophylaxis - Xarelto  Avel Peace, PA-C Orthopaedic Surgery 03/30/2014, 8:43 AM

## 2014-05-14 ENCOUNTER — Other Ambulatory Visit: Payer: Self-pay

## 2014-05-14 DIAGNOSIS — Z1231 Encounter for screening mammogram for malignant neoplasm of breast: Secondary | ICD-10-CM

## 2014-05-22 ENCOUNTER — Ambulatory Visit
Admission: RE | Admit: 2014-05-22 | Discharge: 2014-05-22 | Disposition: A | Discharge status: Home / Self Care | Payer: Medicare Other | Source: Ambulatory Visit

## 2014-05-22 DIAGNOSIS — Z1231 Encounter for screening mammogram for malignant neoplasm of breast: Secondary | ICD-10-CM

## 2015-01-18 ENCOUNTER — Ambulatory Visit: Payer: Medicare Other | Attending: Internal Medicine

## 2015-01-18 DIAGNOSIS — R269 Unspecified abnormalities of gait and mobility: Secondary | ICD-10-CM | POA: Insufficient documentation

## 2015-01-18 DIAGNOSIS — R42 Dizziness and giddiness: Secondary | ICD-10-CM | POA: Insufficient documentation

## 2015-01-18 NOTE — Therapy (Signed)
[facility] [address] [place], [place], [address] Phone: [phone]   Fax:  [phone]  Physical Therapy Evaluation  Patient Details  Name: Canda Podgorski MRN: [id number] Date of Birth: [birth year] Referring Provider: Dr. Thea Silversmith  Encounter Date: 01/18/2015      PT End of Session - 01/18/15 1220    Visit Number 1   Number of Visits 4   Date for PT Re-Evaluation 02/17/15   Authorization Type G-code every 10th visit.   PT Start Time 0845   PT Stop Time 0935   PT Time Calculation (min) 50 min   Activity Tolerance Other (comment)  limited by dizziness   Behavior During Therapy [facility] for tasks assessed/performed      Past Medical History  Diagnosis Date  . Hypertension     a. Treated x 10-15 yrs  . Hypercholesteremia     a. Treated x 5 yrs   . OCD (obsessive compulsive disorder)   . Colon polyps   . Osteoarthritis     a. s/p R TKA 2003;  b. s/p L TKA 2007.  . Carotid artery disease (HCC)     a. 09/2011 Carotid U/S: Eccentric plaque bilat carotid bulbs & origin of bilat ICA's->50-69% on right with borderline elvation of velocities on left.  Degree of narrowing appeared more severe than acquired by velocities.  . Chest pain     a. admx with CP 11/13 => Lexiscan MV 01/25/12:  No ischemia, EF 72%.    . Ejection Fraction     a. Echo 01/25/12: mild LVH, EF 55-60%, Gr 1 diast dysfn.   . OSA on CPAP   . Anginal pain (HCC)     "went to ED-complete work up and everything was fine"  . Borderline diabetes   . Anxiety   . Depression   . History of kidney stones     none since age 73  . GERD (gastroesophageal reflux disease)     Past Surgical History  Procedure Laterality Date  . Left knee replacement      b. 2007  . Right knee replacement      a. 2003  . Cystoscopy      "several"  . Kidney stone surgery      x1  . Nasal septum surgery  1994  . Toe surgery Left 2002    big toe  . Cataract extraction  Bilateral 2007  . Total knee revision Right 03/28/2014    Procedure: RIGHT KNEE POLYETHYLENE REVISION WITH FEMORAL BONE GRAFTING;  Surgeon: Loanne Drilling, MD;  Location: [facility];  Service: Orthopedics;  Laterality: Right;    There were no vitals filed for this visit.  Visit Diagnosis:  Dizziness and giddiness - Plan: PT plan of care cert/re-cert  Abnormality of gait - Plan: PT plan of care cert/re-cert      Subjective Assessment - 01/18/15 0853    Subjective Pt reported dizziness began approx. 2-3 months ago (latest episode). Pt reported it began when she would roll out of bed and felt that the room was spinning and felt a sense of motion even with eyes closed. Pt needs to sit on EOB for 3-5 minutes prior to standing. Pt stated she has fallen too many times to count in the last 6 months.  Pt reported at worst dizziness is: 8/10 and at its best: 0/10. However, pt reported 9/10 during L Dix-Hallpike test. Pt reports dizziness subsides after a few minutes but still feels the need to  hold onto furniture for balance.    Pertinent History Carotid artery disease, B TKA and hx of 2 femur fractures in R femur s/p replacement issues, HTN, OA   Patient Stated Goals To have the room stop spinning and stop falling.    Currently in Pain? No/denies            [facility] PT Assessment - 01/18/15 0856    Assessment   Medical Diagnosis BPPV   Referring Provider Dr. Thea Silversmith   Onset Date/Surgical Date 10/18/14  pt reported it began about 2-3 months ago   Prior Therapy none for vertigo   Precautions   Precautions Fall   Restrictions   Weight Bearing Restrictions No   Balance Screen   Has the patient fallen in the past 6 months Yes   How many times? too many to count   Has the patient had a decrease in activity level because of a fear of falling?  Yes   Is the patient reluctant to leave their home because of a fear of falling?  No   Home [employer]ist information centre manager residence   Living  Arrangements Spouse/significant other   Available Help at Discharge Family   Type of Home House   Home Access Stairs to enter   Entrance Stairs-Number of Steps 6-7   Entrance Stairs-Rails Can reach both   Home Layout One level   Home Equipment Randall - single point;Walker - 2 wheels;Shower seat - built in;Grab bars - tub/shower;Grab bars - toilet   Prior Function   Level of Independence Independent   Vocation Retired   Copy Status Within Functional Limits for tasks assessed   Observation/Other Assessments   Focus on Therapeutic Outcomes (FOTO)  DHI: 66   Transfers   Transfers Sit to Stand;Stand to Sit   Sit to Stand 6: Modified independent (Device/Increase time);With upper extremity assist;From chair/3-in-1   Stand to Sit 6: Modified independent (Device/Increase time);With upper extremity assist;To chair/3-in-1   Comments Incr. time for all movements   Ambulation/Gait   Ambulation/Gait Yes   Ambulation/Gait Assistance 5: Supervision   Ambulation/Gait Assistance Details Pt noted to amb. in very guarded manner, no overt LOB episodes.   Ambulation Distance (Feet) 100 Feet   Assistive device None   Gait Pattern Step-through pattern;Decreased stride length;Decreased trunk rotation   Ambulation Surface Level;Indoor   Gait velocity 2.2ft/sec.  no AD   Standardized Balance Assessment   Standardized Balance Assessment Timed Up and Go Test   Timed Up and Go Test   TUG Normal TUG   Normal TUG (seconds) 10.5  no AD            Vestibular Assessment - 01/18/15 0001    Symptom Behavior   Type of Dizziness Spinning   Frequency of Dizziness Every day, pt unable to state how many times a day as it is very frequent   Duration of Dizziness A few minutes   Aggravating Factors Looking up to the ceiling;Turning head quickly;Turning body quickly;Turning head sideways;Forward bending;Rolling to left   Relieving Factors Rest;Closing eyes   Occulomotor Exam    Occulomotor Alignment Normal   Spontaneous Absent   Gaze-induced Absent   Smooth Pursuits Intact   Saccades Intact   Comment No dizziness reported    Vestibulo-Occular Reflex   VOR 1 Head Only (x 1 viewing) Pt had difficult time performing VOR, as she was very guarded.   Comment PT performed head thrust but pt unable to focus  on PT's nose and had difficulty following directoins 2/2 fear of provoking dizziness. Therefore, unable to assess.   Positional Testing   Dix-Hallpike Dix-Hallpike Right;Dix-Hallpike Left   Horizontal Canal Testing Horizontal Canal Right;Horizontal Canal Left   Dix-Hallpike Right   Dix-Hallpike Right Duration 4/10 dizziness lasting <30 seconds.   Dix-Hallpike Right Symptoms No nystagmus   Dix-Hallpike Left   Dix-Hallpike Left Duration 9/10 dizziness lasting <45 seconds.   Dix-Hallpike Left Symptoms Upbeat, left rotatory nystagmus   Horizontal Canal Right   Horizontal Canal Right Duration none   Horizontal Canal Right Symptoms Normal   Horizontal Canal Left   Horizontal Canal Left Duration none   Horizontal Canal Left Symptoms Normal                Vestibular Treatment/Exercise - 01/18/15 0001    Vestibular Treatment/Exercise   Vestibular Treatment Provided Canalith Repositioning   Canalith Repositioning Epley Manuever Left    EPLEY MANUEVER LEFT   Number of Reps  2   Overall Response  Improved Symptoms    RESPONSE DETAILS LEFT Pt reported dizziness decr. from 9/10 to 0/10 after second Epley manuever.                PT Education - 01/18/15 1219    Education provided Yes   Education Details PT duration/frequency. PT explained symptoms of dizziness consistent with L BPPV and explained canalith repositioning treatment.   Person(s) Educated Patient   Methods Explanation;Demonstration   Comprehension Verbalized understanding          PT Short Term Goals - 01/18/15 1224    PT SHORT TERM GOAL #1   Title same as LTGs.           PT  Long Term Goals - 01/18/15 1224    PT LONG TERM GOAL #1   Title Pt will verbalize understanding of fall prevention strategies to reduce risk of falls. Target date: 02/15/15.   Status New   PT LONG TERM GOAL #2   Title Pt will report 0/10 during all functional acitivities, including turns in order to improve safety. Target date: 02/15/15.   Status New   PT LONG TERM GOAL #3   Title Pt will be IND in HEP to reduce dizziness and improve balance. Target date: 02/15/15.   PT LONG TERM GOAL #4   Title Pt's DHI score will improve to </=48 to improve quality of life. Target date: 02/15/15.   Status New   PT LONG TERM GOAL #5   Title Perform DGI and write goal. Target date: 02/15/15.   Status New   Additional Long Term Goals   Additional Long Term Goals Yes   PT LONG TERM GOAL #6   Title Pt will amb. 1000' over even/uneven terrain IND to improve functional mobility. Target date: 02/15/15.   Status New   PT LONG TERM GOAL #7   Title Pt will report no falls over the last 2 weeks to improve safety during functional mobility. Target date: 02/15/15.               Plan - 01/18/15 1220    Clinical Impression Statement Pt is a pleasant 74y/o female presenting to OPPT neuro with vertigo. Pt's symptoms consistent with L pBPPV. Pt also presented with gait deviations most likely 2/2 pt amb. in guarded manner due to fear of falling and fear of incr. dizziness. Pt was able to perform TUG WNL. Howver, pt's gait speed indicates pt is decreased and not able to amb.  safely in the community. Pt's gait and balance deficits likely 2/2 dizziness, PT will provide HEP and continue to treat dizziness with Epley manuever prn.    Pt will benefit from skilled therapeutic intervention in order to improve on the following deficits Abnormal gait;Dizziness;Decreased mobility;Decreased balance;Decreased strength   Rehab Potential Good   PT Frequency 1x / week   PT Duration 4 weeks   PT Treatment/Interventions ADLs/Self  Care Home Management;Biofeedback;Canalith Repostioning;Balance training;Therapeutic exercise;Manual techniques;Vestibular;Therapeutic activities;Functional mobility training;Patient/family education;Stair training;Gait training;DME Instruction;Neuromuscular re-education   PT Next Visit Plan Perform L Dix-Hallpike and Epley prn. Perform DGI and provide balance HEP.   Consulted and Agree with Plan of Care Patient          G-Codes - January 19, 2015 1229    Functional Assessment Tool Used DHI: 66; No HEP program   Functional Limitation Self care   Self Care Current Status (Z6109) At least 1 percent but less than 20 percent impaired, limited or restricted   Self Care Goal Status (U0454) At least 60 percent but less than 80 percent impaired, limited or restricted       Problem List Patient Active Problem List   Diagnosis Date Noted  . Failed total right knee replacement (HCC) 03/28/2014  . OA (osteoarthritis) of knee 03/28/2014  . Hypercholesteremia   . Carotid artery disease (HCC)   . Colon polyps   . Nephrolithiasis   . Osteoarthritis   . Chest pain   . Ejection Fraction   . OSA on CPAP   . Essential hypertension, benign 01/24/2012  . OCD (obsessive compulsive disorder)     Jabril Pursell L 01-19-2015, 12:30 PM  [facility] [address] [place], [place], [address] Phone: [phone]   Fax:  [phone]  Name: Racquelle Hyser MRN: [id number] Date of Birth: 01-26-1941   Zerita Boers, PT,DPT January 19, 2015 12:31 PM Phone: [phone] Fax: [phone]

## 2015-01-22 ENCOUNTER — Ambulatory Visit: Payer: Medicare Other | Attending: Internal Medicine

## 2015-01-22 DIAGNOSIS — R42 Dizziness and giddiness: Secondary | ICD-10-CM

## 2015-01-22 DIAGNOSIS — R269 Unspecified abnormalities of gait and mobility: Secondary | ICD-10-CM

## 2015-01-22 NOTE — Patient Instructions (Signed)
Perform at counter at home, holding on to counter for safety:  Side to Side Head Motion    While holding onto counter. Walking on solid surface, turn head and eyes to left for __2__ steps. Then, turn head and eyes to opposite side for __2__ steps. Repeat sequence __4__ times per session. Do __1__ sessions per day.   Copyright  VHI. All rights reserved.  Up / Down Head Motion    Perform at counter, holding on. Walking on solid surface, move head and eyes toward ceiling for __2__ steps. Then, move head and eyes toward floor for __2__ steps. Repeat __4__ times per session. Do __1__ sessions per day.   Copyright  VHI. All rights reserved.  Walking-Eyes closed    Perform at counter, holding on. Walk straight ahead, with eyes closed. Repeat __4__ times per session. Do __1__ sessions per day.   Copyright  VHI. All rights reserved.

## 2015-01-22 NOTE — Therapy (Signed)
[facility] [address] [place], [place], [address] Phone: [phone]   Fax:  [phone]  Physical Therapy Treatment  Patient Details  Name: Tiffany AbbeKathryn Bottger MRN: [id number] Date of Birth: 03-Jul-1940 Referring Provider: Dr. Thea SilversmithMacKenzie  Encounter Date: 01/22/2015      PT End of Session - 01/22/15 1306    Visit Number 2   Number of Visits 4   Date for PT Re-Evaluation 02/17/15   Authorization Type G-code every 10th visit.   PT Start Time 1147   PT Stop Time 1229   PT Time Calculation (min) 42 min   Equipment Utilized During Treatment Gait belt   Activity Tolerance Patient tolerated treatment well   Behavior During Therapy WFL for tasks assessed/performed      Past Medical History  Diagnosis Date  . Hypertension     a. Treated x 10-15 yrs  . Hypercholesteremia     a. Treated x 5 yrs   . OCD (obsessive compulsive disorder)   . Colon polyps   . Osteoarthritis     a. s/p R TKA 2003;  b. s/p L TKA 2007.  . Carotid artery disease (HCC)     a. 09/2011 Carotid U/S: Eccentric plaque bilat carotid bulbs & origin of bilat ICA's->50-69% on right with borderline elvation of velocities on left.  Degree of narrowing appeared more severe than acquired by velocities.  . Chest pain     a. admx with CP 11/13 => Lexiscan MV 01/25/12:  No ischemia, EF 72%.    . Ejection Fraction     a. Echo 01/25/12: mild LVH, EF 55-60%, Gr 1 diast dysfn.   . OSA on CPAP   . Anginal pain (HCC)     "went to ED-complete work up and everything was fine"  . Borderline diabetes   . Anxiety   . Depression   . History of kidney stones     none since age 74  . GERD (gastroesophageal reflux disease)     Past Surgical History  Procedure Laterality Date  . Left knee replacement      b. 2007  . Right knee replacement      a. 2003  . Cystoscopy      "several"  . Kidney stone surgery      x1  . Nasal septum surgery  1994  . Toe surgery Left  2002    big toe  . Cataract extraction Bilateral 2007  . Total knee revision Right 03/28/2014    Procedure: RIGHT KNEE POLYETHYLENE REVISION WITH FEMORAL BONE GRAFTING;  Surgeon: Loanne DrillingFrank Aluisio V, MD;  Location: [facility];  Service: Orthopedics;  Laterality: Right;    There were no vitals filed for this visit.  Visit Diagnosis:  Dizziness and giddiness  Abnormality of gait      Subjective Assessment - 01/22/15 1149    Subjective Pt reported she has bouts of N/V and diarrhea every 2-3 months, she had this again on Saturday and Sunday. Pt states  MD is aware and she has an appt with a different MD next week. Pt no longer experiencing sx's of N/V/D. Regarding the dizziness, pt feels much better. Pt reports dizziness 0/10.   Pertinent History Carotid artery disease, B TKA and hx of 2 femur fractures in R femur s/p replacement issues, HTN, OA   Patient Stated Goals To have the room stop spinning and stop falling.    Currently in Pain? No/denies  Vestibular Assessment - 01/22/15 0001    Dix-Hallpike Right   Dix-Hallpike Right Duration 0/10   Dix-Hallpike Right Symptoms No nystagmus   Dix-Hallpike Left   Dix-Hallpike Left Duration 3/10 dizziness with brief nystagmus noted.   Dix-Hallpike Left Symptoms Upbeat, left rotatory nystagmus   Horizontal Canal Right   Horizontal Canal Right Duration none   Horizontal Canal Right Symptoms Normal   Horizontal Canal Left   Horizontal Canal Left Duration none   Horizontal Canal Left Symptoms Normal                 [facility] Adult PT Treatment/Exercise - 01/22/15 1210    Standardized Balance Assessment   Standardized Balance Assessment Dynamic Gait Index   Dynamic Gait Index   Level Surface Mild Impairment   Change in Gait Speed Mild Impairment   Gait with Horizontal Head Turns Moderate Impairment   Gait with Vertical Head Turns Mild Impairment   Gait and Pivot Turn Mild Impairment   Step Over Obstacle Normal   Step  Around Obstacles Normal   Steps Mild Impairment   Total Score 17         Vestibular Treatment/Exercise - 01/22/15 0001    Vestibular Treatment/Exercise   Vestibular Treatment Provided Canalith Repositioning   Canalith Repositioning Epley Manuever Left    EPLEY MANUEVER LEFT   Number of Reps  1   Overall Response  Improved Symptoms    RESPONSE DETAILS LEFT Pt reported sx's improved from 3/10 to 0/10 after first Epley manuever.     Neuro re-ed: Pt performed balance HEP in // bars with min guard to supervision, with 0-1 UE support. Cues for technique and to keep trunk static and to turn head only. Please see pt instructions for details.          PT Education - 01/22/15 1305    Education provided Yes   Education Details PT re-educated pt on BPPV and treatment. PT educated pt on DGI results and balance HEP.   Person(s) Educated Patient   Methods Explanation;Demonstration;Tactile cues;Verbal cues;Handout   Comprehension Returned demonstration;Verbalized understanding;Need further instruction          PT Short Term Goals - 01/18/15 1224    PT SHORT TERM GOAL #1   Title same as LTGs.           PT Long Term Goals - 01/22/15 1308    PT LONG TERM GOAL #1   Title Pt will verbalize understanding of fall prevention strategies to reduce risk of falls. Target date: 02/15/15.   Status On-going   PT LONG TERM GOAL #2   Title Pt will report 0/10 during all functional acitivities, including turns in order to improve safety. Target date: 02/15/15.   Status On-going   PT LONG TERM GOAL #3   Title Pt will be IND in HEP to reduce dizziness and improve balance. Target date: 02/15/15.   Status On-going   PT LONG TERM GOAL #4   Title Pt's DHI score will improve to </=48 to improve quality of life. Target date: 02/15/15.   Status On-going   PT LONG TERM GOAL #5   Title Perform DGI and write goal. Target date: 02/15/15.   Status Achieved   Additional Long Term Goals   Additional  Long Term Goals Yes   PT LONG TERM GOAL #6   Title Pt will amb. 1000' over even/uneven terrain IND to improve functional mobility. Target date: 02/15/15.   Status On-going   PT LONG TERM GOAL #7  Title Pt will report no falls over the last 2 weeks to improve safety during functional mobility. Target date: 02/15/15.   Status On-going   PT LONG TERM GOAL #8   Title Pt will improve DGI score to >/=20/24 to decr. risk for falls. Target date: 02/15/15.   Status New               Plan - 01/22/15 1306    Clinical Impression Statement Pt demonstrated progress, as she reported L Dix-Hallpike dizziness 3/10 today vs. 9/10 during eval. Pt also reported 0/10 dizziness after Epley treatment. Pt's DGI score indicates pt is at risk for falls. Pt would continue to benefit from skilled PT to improve safety during functional mobility.   Pt will benefit from skilled therapeutic intervention in order to improve on the following deficits Abnormal gait;Dizziness;Decreased mobility;Decreased balance;Decreased strength   Rehab Potential Good   PT Frequency 1x / week   PT Duration 4 weeks   PT Treatment/Interventions ADLs/Self Care Home Management;Biofeedback;Canalith Repostioning;Balance training;Therapeutic exercise;Manual techniques;Vestibular;Therapeutic activities;Functional mobility training;Patient/family education;Stair training;Gait training;DME Instruction;Neuromuscular re-education   PT Next Visit Plan Perform L Dix-Hallpike and Epley prn. High level balance/gait activities.   PT Home Exercise Plan Balance HEP   Consulted and Agree with Plan of Care Patient        Problem List Patient Active Problem List   Diagnosis Date Noted  . Failed total right knee replacement (HCC) 03/28/2014  . OA (osteoarthritis) of knee 03/28/2014  . Hypercholesteremia   . Carotid artery disease (HCC)   . Colon polyps   . Nephrolithiasis   . Osteoarthritis   . Chest pain   . Ejection Fraction   . OSA on  CPAP   . Essential hypertension, benign 01/24/2012  . OCD (obsessive compulsive disorder)     Glenden Rossell L 01/22/2015, 1:11 PM  [facility] [address] [place], [place], [address] Phone: [phone]   Fax:  [phone]  Name: Kemisha Bonnette MRN: [id number] Date of Birth: 09-25-1940    Zerita Boers, PT,DPT 01/22/2015 1:11 PM Phone: [phone] Fax: [phone]

## 2015-01-29 ENCOUNTER — Ambulatory Visit: Payer: Medicare Other

## 2015-01-29 DIAGNOSIS — R269 Unspecified abnormalities of gait and mobility: Secondary | ICD-10-CM

## 2015-01-29 DIAGNOSIS — R42 Dizziness and giddiness: Secondary | ICD-10-CM

## 2015-01-29 NOTE — Therapy (Signed)
[facility] [address] [place], [place], [address] Phone: [phone]   Fax:  [phone]  Physical Therapy Treatment  Patient Details  Name: Tiffany Burke MRN: [id number] Date of Birth: 08/28/1940 Referring Provider: Dr. Thea Silversmith  Encounter Date: 01/29/2015      PT End of Session - 01/29/15 1323    Visit Number 3   Number of Visits 4   Date for PT Re-Evaluation 02/17/15   Authorization Type G-code every 10th visit.   PT Start Time 1102   PT Stop Time 1142   PT Time Calculation (min) 40 min   Equipment Utilized During Treatment Gait belt   Activity Tolerance Patient tolerated treatment well   Behavior During Therapy WFL for tasks assessed/performed      Past Medical History  Diagnosis Date  . Hypertension     a. Treated x 10-15 yrs  . Hypercholesteremia     a. Treated x 5 yrs   . OCD (obsessive compulsive disorder)   . Colon polyps   . Osteoarthritis     a. s/p R TKA 2003;  b. s/p L TKA 2007.  . Carotid artery disease (HCC)     a. 09/2011 Carotid U/S: Eccentric plaque bilat carotid bulbs & origin of bilat ICA's->50-69% on right with borderline elvation of velocities on left.  Degree of narrowing appeared more severe than acquired by velocities.  . Chest pain     a. admx with CP 11/13 => Lexiscan MV 01/25/12:  No ischemia, EF 72%.    . Ejection Fraction     a. Echo 01/25/12: mild LVH, EF 55-60%, Gr 1 diast dysfn.   . OSA on CPAP   . Anginal pain (HCC)     "went to ED-complete work up and everything was fine"  . Borderline diabetes   . Anxiety   . Depression   . History of kidney stones     none since age 19  . GERD (gastroesophageal reflux disease)     Past Surgical History  Procedure Laterality Date  . Left knee replacement      b. 2007  . Right knee replacement      a. 2003  . Cystoscopy      "several"  . Kidney stone surgery      x1  . Nasal septum surgery  1994  . Toe surgery Left  2002    big toe  . Cataract extraction Bilateral 2007  . Total knee revision Right 03/28/2014    Procedure: RIGHT KNEE POLYETHYLENE REVISION WITH FEMORAL BONE GRAFTING;  Surgeon: Loanne Drilling, MD;  Location: [facility];  Service: Orthopedics;  Laterality: Right;    There were no vitals filed for this visit.  Visit Diagnosis:  Abnormality of gait  Dizziness and giddiness      Subjective Assessment - 01/29/15 1105    Subjective Pt reported dizziness is very minimal, if any. Pt rated dizziness 2/10 at its worst, while bending over for extended periods of time. Pt reported she feels the exercises are helping reduce dizziness.   Pertinent History Carotid artery disease, B TKA and hx of 2 femur fractures in R femur s/p replacement issues, HTN, OA   Patient Stated Goals To have the room stop spinning and stop falling.    Currently in Pain? No/denies                Vestibular Assessment - 01/29/15 1108    Dix-Hallpike Left   Dix-Hallpike Left Duration 3/10  dizziness which pt described as wooziness vs. the spinning sensation. No nystagmus noted. Sx's lasted <20 seconds.   Dix-Hallpike Left Symptoms No nystagmus                 [facility] Adult PT Treatment/Exercise - 01/29/15 0001    Ambulation/Gait   Ambulation/Gait Yes   Ambulation/Gait Assistance 4: Min guard   Ambulation/Gait Assistance Details Pt noted to experience incr. postural sway and narrow BOS while performing head turns while amb. in grass.    Ambulation Distance (Feet) --  100'x4.   Assistive device None   Gait Pattern Step-through pattern;Decreased stride length;Decreased trunk rotation;Narrow base of support   Ambulation Surface Unlevel;Outdoor;Grass   High Level Balance   High Level Balance Activities Negotitating around obstacles;Negotiating over obstacles   High Level Balance Comments Over even/uneven terrain, no UE, outdoors with min guard to min A to maintain balance: pt negotiated small and tall hurdles,  traversed around and through cones. Cues to improve lat. wt. shifting and knee/hip flexion to clear obstacles. Pt experienced 3 LOB episodes which required stepping strategy and min A to maintain balance.             Balance Exercises - 01/29/15 1147    Balance Exercises: Standing   Rockerboard Anterior/posterior;Lateral;Head turns;EC;EO;30 seconds  no UE support.   Other Standing Exercises In //bars, with min guard to min A, no UE support and cues for technique. Pt required incr. assist maintain balance with eyes closed and during head turns.            PT Education - 01/29/15 1149    Education provided Yes   Education Details PT encouraged pt to continue HEP and educated pt that dizziness is most likely 2/2 decr. vestibular input vs. BPPV, as L Dix-Hallpike negative for nystagmus and pt reporting no spinning dizziness.   Person(s) Educated Patient   Methods Explanation   Comprehension Verbalized understanding          PT Short Term Goals - 01/18/15 1224    PT SHORT TERM GOAL #1   Title same as LTGs.           PT Long Term Goals - 01/29/15 1331    PT LONG TERM GOAL #1   Title Pt will verbalize understanding of fall prevention strategies to reduce risk of falls. Target date: 02/15/15.   Status On-going   PT LONG TERM GOAL #2   Title Pt will report 0/10 during all functional acitivities, including turns in order to improve safety. Target date: 02/15/15.   Status On-going   PT LONG TERM GOAL #3   Title Pt will be IND in HEP to reduce dizziness and improve balance. Target date: 02/15/15.   Status On-going   PT LONG TERM GOAL #4   Title Pt's DHI score will improve to </=48 to improve quality of life. Target date: 02/15/15.   Status On-going   PT LONG TERM GOAL #5   Title Perform DGI and write goal. Target date: 02/15/15.   Status Achieved   PT LONG TERM GOAL #6   Title Pt will amb. 1000' over even/uneven terrain IND to improve functional mobility. Target date:  02/15/15.   Status On-going   PT LONG TERM GOAL #7   Title Pt will report no falls over the last 2 weeks to improve safety during functional mobility. Target date: 02/15/15.   Status On-going   PT LONG TERM GOAL #8   Title Pt will improve DGI score to >/=  20/24 to decr. risk for falls. Target date: 02/15/15.   Status On-going               Plan - 01/29/15 1324    Clinical Impression Statement Pt demonstrated progress, as no nystagmus noted during L Dix-Hallpike and pt described dizziness as woozy and not spinning. This indicates vertigo is most likely cleared and impaired balance is likely from decr. vestibular input. Pt also continues to experience impaired balance and postural sway during activities which require vestibular input, such as eyes closed and head turns.  Pt would continue to benefit from skilled PT to improve safety during functional mobility.    Pt will benefit from skilled therapeutic intervention in order to improve on the following deficits Abnormal gait;Dizziness;Decreased mobility;Decreased balance;Decreased strength   Rehab Potential Good   PT Frequency 1x / week   PT Duration 4 weeks   PT Treatment/Interventions ADLs/Self Care Home Management;Biofeedback;Canalith Repostioning;Balance training;Therapeutic exercise;Manual techniques;Vestibular;Therapeutic activities;Functional mobility training;Patient/family education;Stair training;Gait training;DME Instruction;Neuromuscular re-education   PT Next Visit Plan Check goals, and add more visits/renew prn.   PT Home Exercise Plan Balance HEP   Consulted and Agree with Plan of Care Patient        Problem List Patient Active Problem List   Diagnosis Date Noted  . Failed total right knee replacement (HCC) 03/28/2014  . OA (osteoarthritis) of knee 03/28/2014  . Hypercholesteremia   . Carotid artery disease (HCC)   . Colon polyps   . Nephrolithiasis   . Osteoarthritis   . Chest pain   . Ejection Fraction   .  OSA on CPAP   . Essential hypertension, benign 01/24/2012  . OCD (obsessive compulsive disorder)     Miller,Jennifer L 01/29/2015, 1:33 PM  [facility] [address] [place], [place], [address] Phone: [phone]   Fax:  [phone]  Name: Tiffany Burke MRN: [id number] Date of Birth: 06-Oct-1940    Zerita BoersJennifer Miller, PT,DPT 01/29/2015 1:33 PM Phone: [phone] Fax: [phone]

## 2015-02-05 ENCOUNTER — Ambulatory Visit: Payer: Medicare Other

## 2015-02-05 DIAGNOSIS — R42 Dizziness and giddiness: Secondary | ICD-10-CM | POA: Diagnosis not present

## 2015-02-05 DIAGNOSIS — R269 Unspecified abnormalities of gait and mobility: Secondary | ICD-10-CM

## 2015-02-05 NOTE — Patient Instructions (Addendum)
Fall Prevention in the Home  Falls can cause injuries and can affect people from all age groups. There are many simple things that you can do to make your home safe and to help prevent falls. WHAT CAN I DO ON THE OUTSIDE OF MY HOME?  Regularly repair the edges of walkways and driveways and fix any cracks.  Remove high doorway thresholds.  Trim any shrubbery on the main path into your home.  Use bright outdoor lighting.  Clear walkways of debris and clutter, including tools and rocks.  Regularly check that handrails are securely fastened and in good repair. Both sides of any steps should have handrails.  Install guardrails along the edges of any raised decks or porches.  Have leaves, snow, and ice cleared regularly.  Use sand or salt on walkways during winter months.  In the garage, clean up any spills right away, including grease or oil spills. WHAT CAN I DO IN THE BATHROOM?  Use night lights.  Install grab bars by the toilet and in the tub and shower. Do not use towel bars as grab bars.  Use non-skid mats or decals on the floor of the tub or shower.  If you need to sit down while you are in the shower, use a plastic, non-slip stool..  Keep the floor dry. Immediately clean up any water that spills on the floor.  Remove soap buildup in the tub or shower on a regular basis.  Attach bath mats securely with double-sided non-slip rug tape.  Remove throw rugs and other tripping hazards from the floor. WHAT CAN I DO IN THE BEDROOM?  Use night lights.  Make sure that a bedside light is easy to reach.  Do not use oversized bedding that drapes onto the floor.  Have a firm chair that has side arms to use for getting dressed.  Remove throw rugs and other tripping hazards from the floor. WHAT CAN I DO IN THE KITCHEN?   Clean up any spills right away.  Avoid walking on wet floors.  Place frequently used items in easy-to-reach places.  If you need to reach for something  above you, use a sturdy step stool that has a grab bar.  Keep electrical cables out of the way.  Do not use floor polish or wax that makes floors slippery. If you have to use wax, make sure that it is non-skid floor wax.  Remove throw rugs and other tripping hazards from the floor. WHAT CAN I DO IN THE STAIRWAYS?  Do not leave any items on the stairs.  Make sure that there are handrails on both sides of the stairs. Fix handrails that are broken or loose. Make sure that handrails are as long as the stairways.  Check any carpeting to make sure that it is firmly attached to the stairs. Fix any carpet that is loose or worn.  Avoid having throw rugs at the top or bottom of stairways, or secure the rugs with carpet tape to prevent them from moving.  Make sure that you have a light switch at the top of the stairs and the bottom of the stairs. If you do not have them, have them installed. WHAT ARE SOME OTHER FALL PREVENTION TIPS?  Wear closed-toe shoes that fit well and support your feet. Wear shoes that have rubber soles or low heels.  When you use a stepladder, make sure that it is completely opened and that the sides are firmly locked. Have someone hold the ladder while you   are using it. Do not climb a closed stepladder.  Add color or contrast paint or tape to grab bars and handrails in your home. Place contrasting color strips on the first and last steps.  Use mobility aids as needed, such as canes, walkers, scooters, and crutches.  Turn on lights if it is dark. Replace any light bulbs that burn out.  Set up furniture so that there are clear paths. Keep the furniture in the same spot.  Fix any uneven floor surfaces.  Choose a carpet design that does not hide the edge of steps of a stairway.  Be aware of any and all pets.  Review your medicines with your healthcare provider. Some medicines can cause dizziness or changes in blood pressure, which increase your risk of falling. Talk  with your health care provider about other ways that you can decrease your risk of falls. This may include working with a physical therapist or trainer to improve your strength, balance, and endurance.   This information is not intended to replace advice given to you by your health care provider. Make sure you discuss any questions you have with your health care provider.   Document Released: 02/27/2002 Document Revised: 07/24/2014 Document Reviewed: 04/13/2014 Elsevier Interactive Patient Education 2016 [employer]    Perform at counter at home, holding on to counter for safety:  Side to Side Head Motion    While holding onto counter. Walking on solid surface, turn head and eyes to left for __2__ steps. Then, turn head and eyes to opposite side for __2__ steps. Repeat sequence __4__ times per session. Do __1__ sessions per day.   Copyright  VHI. All rights reserved.  Up / Down Head Motion    Perform at counter, holding on. Walking on solid surface, move head and eyes toward ceiling for __2__ steps. Then, move head and eyes toward floor for __2__ steps. Repeat __4__ times per session. Do __1__ sessions per day.   Copyright  VHI. All rights reserved.  Walking-Eyes closed    Perform at counter, holding on. Walk straight ahead, with eyes closed. Repeat __4__ times per session. Do __1__ sessions per day.   Copyright  VHI. All rights reserved.

## 2015-02-05 NOTE — Therapy (Signed)
[facility] [address] [place], [place], [address] Phone: [phone]   Fax:  [phone]  Physical Therapy Treatment  Patient Details  Name: Tiffany Burke MRN: [id number] Date of Birth: Aug 07, 1940 Referring Provider: Dr. Noah Delaine  Encounter Date: 02/05/2015      PT End of Session - 02/05/15 1141    Visit Number 4   Number of Visits 4   Date for PT Re-Evaluation 02/17/15   Authorization Type G-code every 10th visit.   PT Start Time 1103   PT Stop Time 1134   PT Time Calculation (min) 31 min   Activity Tolerance Patient tolerated treatment well   Behavior During Therapy WFL for tasks assessed/performed      Past Medical History  Diagnosis Date  . Hypertension     a. Treated x 10-15 yrs  . Hypercholesteremia     a. Treated x 5 yrs   . OCD (obsessive compulsive disorder)   . Colon polyps   . Osteoarthritis     a. s/p R TKA 2003;  b. s/p L TKA 2007.  . Carotid artery disease ([place])     a. 09/2011 Carotid U/S: Eccentric plaque bilat carotid bulbs & origin of bilat ICA's->50-69% on right with borderline elvation of velocities on left.  Degree of narrowing appeared more severe than acquired by velocities.  . Chest pain     a. admx with CP 11/13 => Lexiscan MV 01/25/12:  No ischemia, EF 72%.    . Ejection Fraction     a. Echo 01/25/12: mild LVH, EF 55-60%, Gr 1 diast dysfn.   . OSA on CPAP   . Anginal pain ([place])     "went to ED-complete work up and everything was fine"  . Borderline diabetes   . Anxiety   . Depression   . History of kidney stones     none since age 72  . GERD (gastroesophageal reflux disease)     Past Surgical History  Procedure Laterality Date  . Left knee replacement      b. 2007  . Right knee replacement      a. 2003  . Cystoscopy      "several"  . Kidney stone surgery      x1  . Nasal septum surgery  1994  . Toe surgery Left 2002    big toe  . Cataract extraction  Bilateral 2007  . Total knee revision Right 03/28/2014    Procedure: RIGHT KNEE POLYETHYLENE REVISION WITH FEMORAL BONE GRAFTING;  Surgeon: Gearlean Alf, MD;  Location: [facility];  Service: Orthopedics;  Laterality: Right;    There were no vitals filed for this visit.  Visit Diagnosis:  Abnormality of gait  Dizziness and giddiness      Subjective Assessment - 02/05/15 1108    Subjective Pt reported she has not experienced any dizziness since last visit. Pt reported HEP is going well. Pt reported she was able to bend down and tie her shoe without dizziness or fear of falling.   Pertinent History Carotid artery disease, B TKA and hx of 2 femur fractures in R femur s/p replacement issues, HTN, OA   Patient Stated Goals To have the room stop spinning and stop falling.    Currently in Pain? No/denies                Vestibular Assessment - 02/05/15 0001    Dix-Hallpike Right   Dix-Hallpike Right Duration 0/10, no dizziness   Dix-Hallpike Right  Symptoms No nystagmus   Dix-Hallpike Left   Dix-Hallpike Left Duration 0/10, no dizziness   Dix-Hallpike Left Symptoms No nystagmus     Neuro re-ed: Pt performed balance HEP at counter. With supervision to ensure safety, no cues for technique required. Please see pt instructions for details.            [facility] Adult PT Treatment/Exercise - 02/05/15 1118    Ambulation/Gait   Ambulation/Gait Yes   Ambulation/Gait Assistance 5: Supervision;7: Independent   Ambulation/Gait Assistance Details Pt IND in gait, except during head turns over uneven terrain, pt required stepping strategy to maintain balance and supervision.   Ambulation Distance (Feet) 1000 Feet   Assistive device None   Gait Pattern Step-through pattern;Decreased stride length;Decreased trunk rotation;Narrow base of support  intermittent narrow BOS   Ambulation Surface Level;Outdoor;Unlevel;Indoor;Paved;Grass   Dynamic Gait Index   Level Surface Normal   Change in Gait  Speed Normal   Gait with Horizontal Head Turns Mild Impairment   Gait with Vertical Head Turns Mild Impairment   Gait and Pivot Turn Normal   Step Over Obstacle Normal   Step Around Obstacles Normal   Steps Mild Impairment   Total Score 21                PT Education - 02/05/15 1140    Education provided Yes   Education Details PT encouraged pt to continue HEP to improve balance during head turns and incr. vestibular input. PT explained that vertigo can return, especially with pt's hx of vertigo and that pt would require new PT referral if dizziness returns. PT explained goal progress and outcome measure results. Reviewed HEP and fall prevention strategy.   Person(s) Educated Patient   Methods Explanation;Handout   Comprehension Verbalized understanding;Returned demonstration    Pt completed [place] after session: 12      PT Short Term Goals - 01/18/15 1224    PT SHORT TERM GOAL #1   Title same as LTGs.           PT Long Term Goals - 02/05/15 1245    PT LONG TERM GOAL #1   Title Pt will verbalize understanding of fall prevention strategies to reduce risk of falls. Target date: 02/15/15.   Status Achieved   PT LONG TERM GOAL #2   Title Pt will report 0/10 during all functional acitivities, including turns in order to improve safety. Target date: 02/15/15.   Status Achieved   PT LONG TERM GOAL #3   Title Pt will be IND in HEP to reduce dizziness and improve balance. Target date: 02/15/15.   Status Achieved   PT LONG TERM GOAL #4   Title Pt's DHI score will improve to </=48 to improve quality of life. Target date: 02/15/15.   Status Achieved   PT LONG TERM GOAL #5   Title Perform DGI and write goal. Target date: 02/15/15.   Status Achieved   PT LONG TERM GOAL #6   Title Pt will amb. 1000' over even/uneven terrain IND to improve functional mobility. Target date: 02/15/15.   Status Partially Met   PT LONG TERM GOAL #7   Title Pt will report no falls over the last 2  weeks to improve safety during functional mobility. Target date: 02/15/15.   Status Achieved   PT LONG TERM GOAL #8   Title Pt will improve DGI score to >/=20/24 to decr. risk for falls. Target date: 02/15/15.   Status Achieved  Plan - 03-04-2015 1142    Clinical Impression Statement Pt met LTGs 1, 2, 3, 4, 7 and 8. Pt partially met LTG 6. B Dix-Hallpike negative for nystagmus and dizziness, indicating vertigo has cleared. Pt discharging today due to meeting goals. Please see d/c summary for details.          G-Codes - 03/04/2015 1245    Functional Assessment Tool Used DHI: 12; IND in balance HEP   Functional Limitation Self care   Self Care Goal Status [phone]) At least 1 percent but less than 20 percent impaired, limited or restricted   Self Care Discharge Status [phone]) At least 1 percent but less than 20 percent impaired, limited or restricted      Problem List Patient Active Problem List   Diagnosis Date Noted  . Failed total right knee replacement ([place]) 03/28/2014  . OA (osteoarthritis) of knee 03/28/2014  . Hypercholesteremia   . Carotid artery disease ([place])   . Colon polyps   . Nephrolithiasis   . Osteoarthritis   . Chest pain   . Ejection Fraction   . OSA on CPAP   . Essential hypertension, benign 01/24/2012  . OCD (obsessive compulsive disorder)     Miller,Jennifer L 03/04/15, 12:47 PM  [facility] [address] [place], [place], [address] Phone: [phone]   Fax:  [phone]  Name: Tiffany Burke MRN: [id number] Date of Birth: October 30, 1940    PHYSICAL THERAPY DISCHARGE SUMMARY  Visits from Start of Care: 4  Current functional level related to goals / functional outcomes:     PT Long Term Goals - 03-04-2015 1245    PT LONG TERM GOAL #1   Title Pt will verbalize understanding of fall prevention strategies to reduce risk of falls. Target date: 02/15/15.   Status  Achieved   PT LONG TERM GOAL #2   Title Pt will report 0/10 during all functional acitivities, including turns in order to improve safety. Target date: 02/15/15.   Status Achieved   PT LONG TERM GOAL #3   Title Pt will be IND in HEP to reduce dizziness and improve balance. Target date: 02/15/15.   Status Achieved   PT LONG TERM GOAL #4   Title Pt's DHI score will improve to </=48 to improve quality of life. Target date: 02/15/15.   Status Achieved   PT LONG TERM GOAL #5   Title Perform DGI and write goal. Target date: 02/15/15.   Status Achieved   PT LONG TERM GOAL #6   Title Pt will amb. 1000' over even/uneven terrain IND to improve functional mobility. Target date: 02/15/15.   Status Partially Met   PT LONG TERM GOAL #7   Title Pt will report no falls over the last 2 weeks to improve safety during functional mobility. Target date: 02/15/15.   Status Achieved   PT LONG TERM GOAL #8   Title Pt will improve DGI score to >/=20/24 to decr. risk for falls. Target date: 02/15/15.   Status Achieved        Remaining deficits: Intermittent increased postural sway while performing head turns during amb.    Education / Equipment: HEP  Plan: Patient agrees to discharge.  Patient goals were met. Patient is being discharged due to meeting the stated rehab goals.  ?????        Geoffry Paradise, PT,DPT 2015/03/04 12:47 PM Phone: [phone] Fax: [phone]

## 2015-05-14 DIAGNOSIS — L400 Psoriasis vulgaris: Secondary | ICD-10-CM | POA: Diagnosis not present

## 2015-06-18 DIAGNOSIS — H524 Presbyopia: Secondary | ICD-10-CM | POA: Diagnosis not present

## 2015-06-18 DIAGNOSIS — H26493 Other secondary cataract, bilateral: Secondary | ICD-10-CM | POA: Diagnosis not present

## 2015-07-01 DIAGNOSIS — L400 Psoriasis vulgaris: Secondary | ICD-10-CM | POA: Diagnosis not present

## 2015-07-29 DIAGNOSIS — L400 Psoriasis vulgaris: Secondary | ICD-10-CM | POA: Diagnosis not present

## 2015-08-26 DIAGNOSIS — G4733 Obstructive sleep apnea (adult) (pediatric): Secondary | ICD-10-CM | POA: Diagnosis not present

## 2015-08-26 DIAGNOSIS — K219 Gastro-esophageal reflux disease without esophagitis: Secondary | ICD-10-CM | POA: Diagnosis not present

## 2015-08-26 DIAGNOSIS — T17320A Food in larynx causing asphyxiation, initial encounter: Secondary | ICD-10-CM | POA: Diagnosis not present

## 2015-08-26 DIAGNOSIS — F419 Anxiety disorder, unspecified: Secondary | ICD-10-CM | POA: Diagnosis not present

## 2015-08-26 DIAGNOSIS — I10 Essential (primary) hypertension: Secondary | ICD-10-CM | POA: Diagnosis not present

## 2015-08-26 DIAGNOSIS — Z0000 Encounter for general adult medical examination without abnormal findings: Secondary | ICD-10-CM | POA: Diagnosis not present

## 2015-08-26 DIAGNOSIS — E785 Hyperlipidemia, unspecified: Secondary | ICD-10-CM | POA: Diagnosis not present

## 2015-08-26 DIAGNOSIS — E559 Vitamin D deficiency, unspecified: Secondary | ICD-10-CM | POA: Diagnosis not present

## 2015-08-26 DIAGNOSIS — Z1389 Encounter for screening for other disorder: Secondary | ICD-10-CM | POA: Diagnosis not present

## 2015-08-30 ENCOUNTER — Other Ambulatory Visit: Payer: Self-pay | Admitting: Family Medicine

## 2015-08-30 DIAGNOSIS — Z1231 Encounter for screening mammogram for malignant neoplasm of breast: Secondary | ICD-10-CM

## 2015-09-12 ENCOUNTER — Ambulatory Visit
Admission: RE | Admit: 2015-09-12 | Discharge: 2015-09-12 | Disposition: A | Discharge status: Home / Self Care | Payer: Medicare Other | Source: Ambulatory Visit | Attending: Family Medicine | Admitting: Family Medicine

## 2015-09-12 DIAGNOSIS — Z1231 Encounter for screening mammogram for malignant neoplasm of breast: Secondary | ICD-10-CM | POA: Diagnosis not present

## 2015-09-16 ENCOUNTER — Other Ambulatory Visit: Payer: Self-pay | Admitting: Family Medicine

## 2015-09-16 DIAGNOSIS — R928 Other abnormal and inconclusive findings on diagnostic imaging of breast: Secondary | ICD-10-CM

## 2015-09-18 ENCOUNTER — Ambulatory Visit
Admission: RE | Admit: 2015-09-18 | Discharge: 2015-09-18 | Disposition: A | Discharge status: Home / Self Care | Payer: Medicare Other | Source: Ambulatory Visit | Attending: Family Medicine | Admitting: Family Medicine

## 2015-09-18 DIAGNOSIS — R928 Other abnormal and inconclusive findings on diagnostic imaging of breast: Secondary | ICD-10-CM

## 2015-09-23 DIAGNOSIS — G4733 Obstructive sleep apnea (adult) (pediatric): Secondary | ICD-10-CM | POA: Diagnosis not present

## 2015-09-23 DIAGNOSIS — L400 Psoriasis vulgaris: Secondary | ICD-10-CM | POA: Diagnosis not present

## 2015-09-26 ENCOUNTER — Other Ambulatory Visit (HOSPITAL_COMMUNITY): Payer: Self-pay | Admitting: Gastroenterology

## 2015-09-26 DIAGNOSIS — R1310 Dysphagia, unspecified: Secondary | ICD-10-CM | POA: Diagnosis not present

## 2015-09-27 ENCOUNTER — Other Ambulatory Visit (HOSPITAL_COMMUNITY): Payer: Self-pay | Admitting: Gastroenterology

## 2015-09-27 DIAGNOSIS — R1310 Dysphagia, unspecified: Secondary | ICD-10-CM

## 2015-09-30 DIAGNOSIS — L400 Psoriasis vulgaris: Secondary | ICD-10-CM | POA: Diagnosis not present

## 2015-10-04 DIAGNOSIS — G4733 Obstructive sleep apnea (adult) (pediatric): Secondary | ICD-10-CM | POA: Diagnosis not present

## 2015-10-08 ENCOUNTER — Ambulatory Visit (HOSPITAL_COMMUNITY)
Admission: RE | Admit: 2015-10-08 | Discharge: 2015-10-08 | Disposition: A | Discharge status: Home / Self Care | Payer: Medicare Other | Source: Ambulatory Visit | Attending: Gastroenterology | Admitting: Gastroenterology

## 2015-10-08 DIAGNOSIS — F429 Obsessive-compulsive disorder, unspecified: Secondary | ICD-10-CM | POA: Diagnosis not present

## 2015-10-08 DIAGNOSIS — M1990 Unspecified osteoarthritis, unspecified site: Secondary | ICD-10-CM | POA: Insufficient documentation

## 2015-10-08 DIAGNOSIS — I10 Essential (primary) hypertension: Secondary | ICD-10-CM | POA: Diagnosis not present

## 2015-10-08 DIAGNOSIS — E7800 Pure hypercholesterolemia, unspecified: Secondary | ICD-10-CM | POA: Diagnosis not present

## 2015-10-08 DIAGNOSIS — K224 Dyskinesia of esophagus: Secondary | ICD-10-CM | POA: Diagnosis not present

## 2015-10-08 DIAGNOSIS — F419 Anxiety disorder, unspecified: Secondary | ICD-10-CM | POA: Insufficient documentation

## 2015-10-08 DIAGNOSIS — G4733 Obstructive sleep apnea (adult) (pediatric): Secondary | ICD-10-CM | POA: Diagnosis not present

## 2015-10-08 DIAGNOSIS — K219 Gastro-esophageal reflux disease without esophagitis: Secondary | ICD-10-CM | POA: Diagnosis not present

## 2015-10-08 DIAGNOSIS — R7303 Prediabetes: Secondary | ICD-10-CM | POA: Diagnosis not present

## 2015-10-08 DIAGNOSIS — R1310 Dysphagia, unspecified: Secondary | ICD-10-CM

## 2015-10-08 DIAGNOSIS — K222 Esophageal obstruction: Secondary | ICD-10-CM | POA: Diagnosis not present

## 2015-10-08 DIAGNOSIS — F329 Major depressive disorder, single episode, unspecified: Secondary | ICD-10-CM | POA: Diagnosis not present

## 2015-10-08 NOTE — Progress Notes (Signed)
MBSS complete. Full report located under chart review in imaging section.  Eulla Kochanowski MA, CCC-SLP [phone]   

## 2015-10-23 ENCOUNTER — Encounter (HOSPITAL_COMMUNITY)
Admission: RE | Disposition: A | Discharge status: Home / Self Care | Payer: Self-pay | Source: Ambulatory Visit | Attending: Gastroenterology

## 2015-10-23 ENCOUNTER — Ambulatory Visit (HOSPITAL_COMMUNITY)
Admission: RE | Admit: 2015-10-23 | Discharge: 2015-10-23 | Disposition: A | Discharge status: Home / Self Care | Payer: Medicare Other | Source: Ambulatory Visit | Attending: Gastroenterology | Admitting: Gastroenterology

## 2015-10-23 DIAGNOSIS — K220 Achalasia of cardia: Secondary | ICD-10-CM | POA: Insufficient documentation

## 2015-10-23 DIAGNOSIS — R1310 Dysphagia, unspecified: Secondary | ICD-10-CM | POA: Diagnosis present

## 2015-10-23 HISTORY — PX: ESOPHAGEAL MANOMETRY: SHX5429

## 2015-10-23 SURGERY — MANOMETRY, ESOPHAGUS

## 2015-10-23 MED ORDER — LIDOCAINE VISCOUS 2 % MT SOLN
OROMUCOSAL | Status: AC
  Filled 2015-10-23: qty 15

## 2015-10-23 SURGICAL SUPPLY — 2 items
FACESHIELD LNG OPTICON STERILE (SAFETY) IMPLANT
GLOVE BIO SURGEON STRL SZ8 (GLOVE) ×4 IMPLANT

## 2015-10-24 ENCOUNTER — Encounter (HOSPITAL_COMMUNITY): Payer: Self-pay | Admitting: Gastroenterology

## 2015-10-30 DIAGNOSIS — R1310 Dysphagia, unspecified: Secondary | ICD-10-CM | POA: Diagnosis not present

## 2015-10-30 DIAGNOSIS — K220 Achalasia of cardia: Secondary | ICD-10-CM | POA: Diagnosis not present

## 2015-11-04 DIAGNOSIS — E785 Hyperlipidemia, unspecified: Secondary | ICD-10-CM | POA: Diagnosis not present

## 2015-11-26 DIAGNOSIS — K220 Achalasia of cardia: Secondary | ICD-10-CM | POA: Diagnosis not present

## 2015-11-26 DIAGNOSIS — R1310 Dysphagia, unspecified: Secondary | ICD-10-CM | POA: Diagnosis not present

## 2015-11-29 DIAGNOSIS — Z23 Encounter for immunization: Secondary | ICD-10-CM | POA: Diagnosis not present

## 2015-12-09 DIAGNOSIS — L400 Psoriasis vulgaris: Secondary | ICD-10-CM | POA: Diagnosis not present

## 2015-12-28 DIAGNOSIS — M436 Torticollis: Secondary | ICD-10-CM | POA: Diagnosis not present

## 2016-01-09 DIAGNOSIS — K2970 Gastritis, unspecified, without bleeding: Secondary | ICD-10-CM | POA: Diagnosis not present

## 2016-01-09 DIAGNOSIS — R1310 Dysphagia, unspecified: Secondary | ICD-10-CM | POA: Diagnosis not present

## 2016-01-09 DIAGNOSIS — R933 Abnormal findings on diagnostic imaging of other parts of digestive tract: Secondary | ICD-10-CM | POA: Diagnosis not present

## 2016-01-24 DIAGNOSIS — K220 Achalasia of cardia: Secondary | ICD-10-CM | POA: Diagnosis not present

## 2016-01-24 DIAGNOSIS — Z87891 Personal history of nicotine dependence: Secondary | ICD-10-CM | POA: Diagnosis not present

## 2016-01-24 DIAGNOSIS — Z880 Allergy status to penicillin: Secondary | ICD-10-CM | POA: Diagnosis not present

## 2016-01-24 DIAGNOSIS — Z79899 Other long term (current) drug therapy: Secondary | ICD-10-CM | POA: Diagnosis not present

## 2016-01-24 DIAGNOSIS — Z881 Allergy status to other antibiotic agents status: Secondary | ICD-10-CM | POA: Diagnosis not present

## 2016-02-06 DIAGNOSIS — I779 Disorder of arteries and arterioles, unspecified: Secondary | ICD-10-CM | POA: Diagnosis not present

## 2016-02-06 DIAGNOSIS — I10 Essential (primary) hypertension: Secondary | ICD-10-CM | POA: Diagnosis not present

## 2016-02-06 DIAGNOSIS — R001 Bradycardia, unspecified: Secondary | ICD-10-CM | POA: Diagnosis not present

## 2016-02-06 DIAGNOSIS — I6521 Occlusion and stenosis of right carotid artery: Secondary | ICD-10-CM | POA: Diagnosis not present

## 2016-02-06 DIAGNOSIS — Z87891 Personal history of nicotine dependence: Secondary | ICD-10-CM | POA: Diagnosis not present

## 2016-02-06 DIAGNOSIS — F418 Other specified anxiety disorders: Secondary | ICD-10-CM | POA: Diagnosis present

## 2016-02-11 DIAGNOSIS — G4733 Obstructive sleep apnea (adult) (pediatric): Secondary | ICD-10-CM | POA: Diagnosis not present

## 2016-02-11 DIAGNOSIS — L409 Psoriasis, unspecified: Secondary | ICD-10-CM | POA: Diagnosis not present

## 2016-02-11 DIAGNOSIS — K220 Achalasia of cardia: Secondary | ICD-10-CM | POA: Diagnosis not present

## 2016-02-11 DIAGNOSIS — E669 Obesity, unspecified: Secondary | ICD-10-CM | POA: Diagnosis not present

## 2016-02-11 DIAGNOSIS — R1310 Dysphagia, unspecified: Secondary | ICD-10-CM | POA: Diagnosis not present

## 2016-02-11 DIAGNOSIS — I10 Essential (primary) hypertension: Secondary | ICD-10-CM | POA: Diagnosis not present

## 2016-02-11 DIAGNOSIS — Z883 Allergy status to other anti-infective agents status: Secondary | ICD-10-CM | POA: Diagnosis not present

## 2016-02-11 DIAGNOSIS — Z6833 Body mass index (BMI) 33.0-33.9, adult: Secondary | ICD-10-CM | POA: Diagnosis not present

## 2016-02-11 DIAGNOSIS — R634 Abnormal weight loss: Secondary | ICD-10-CM | POA: Diagnosis not present

## 2016-02-11 DIAGNOSIS — Z880 Allergy status to penicillin: Secondary | ICD-10-CM | POA: Diagnosis not present

## 2016-02-25 DIAGNOSIS — F419 Anxiety disorder, unspecified: Secondary | ICD-10-CM | POA: Diagnosis not present

## 2016-02-25 DIAGNOSIS — I10 Essential (primary) hypertension: Secondary | ICD-10-CM | POA: Diagnosis not present

## 2016-02-25 DIAGNOSIS — K219 Gastro-esophageal reflux disease without esophagitis: Secondary | ICD-10-CM | POA: Diagnosis not present

## 2016-02-25 DIAGNOSIS — E785 Hyperlipidemia, unspecified: Secondary | ICD-10-CM | POA: Diagnosis not present

## 2016-03-04 DIAGNOSIS — Z48815 Encounter for surgical aftercare following surgery on the digestive system: Secondary | ICD-10-CM | POA: Diagnosis not present

## 2016-04-16 DIAGNOSIS — R7309 Other abnormal glucose: Secondary | ICD-10-CM | POA: Diagnosis not present

## 2016-05-07 DIAGNOSIS — L400 Psoriasis vulgaris: Secondary | ICD-10-CM | POA: Diagnosis not present

## 2016-06-04 DIAGNOSIS — R42 Dizziness and giddiness: Secondary | ICD-10-CM | POA: Diagnosis not present

## 2016-07-22 DIAGNOSIS — H353121 Nonexudative age-related macular degeneration, left eye, early dry stage: Secondary | ICD-10-CM | POA: Diagnosis not present

## 2016-07-22 DIAGNOSIS — H524 Presbyopia: Secondary | ICD-10-CM | POA: Diagnosis not present

## 2016-08-26 DIAGNOSIS — Z1389 Encounter for screening for other disorder: Secondary | ICD-10-CM | POA: Diagnosis not present

## 2016-08-26 DIAGNOSIS — Z0000 Encounter for general adult medical examination without abnormal findings: Secondary | ICD-10-CM | POA: Diagnosis not present

## 2016-08-26 DIAGNOSIS — E559 Vitamin D deficiency, unspecified: Secondary | ICD-10-CM | POA: Diagnosis not present

## 2016-08-26 DIAGNOSIS — I10 Essential (primary) hypertension: Secondary | ICD-10-CM | POA: Diagnosis not present

## 2016-09-10 ENCOUNTER — Other Ambulatory Visit: Payer: Self-pay | Admitting: Family Medicine

## 2016-09-10 DIAGNOSIS — Z1231 Encounter for screening mammogram for malignant neoplasm of breast: Secondary | ICD-10-CM

## 2016-09-25 ENCOUNTER — Ambulatory Visit
Admission: RE | Admit: 2016-09-25 | Discharge: 2016-09-25 | Disposition: A | Discharge status: Home / Self Care | Payer: Medicare Other | Source: Ambulatory Visit | Attending: Family Medicine | Admitting: Family Medicine

## 2016-09-25 DIAGNOSIS — Z1231 Encounter for screening mammogram for malignant neoplasm of breast: Secondary | ICD-10-CM | POA: Diagnosis not present

## 2016-10-05 DIAGNOSIS — G4733 Obstructive sleep apnea (adult) (pediatric): Secondary | ICD-10-CM | POA: Diagnosis not present

## 2016-10-19 DIAGNOSIS — Z13820 Encounter for screening for osteoporosis: Secondary | ICD-10-CM | POA: Diagnosis not present

## 2016-10-19 DIAGNOSIS — Z780 Asymptomatic menopausal state: Secondary | ICD-10-CM | POA: Diagnosis not present

## 2016-10-22 DIAGNOSIS — G4733 Obstructive sleep apnea (adult) (pediatric): Secondary | ICD-10-CM | POA: Diagnosis not present

## 2017-01-26 DIAGNOSIS — L400 Psoriasis vulgaris: Secondary | ICD-10-CM | POA: Diagnosis not present

## 2017-01-26 DIAGNOSIS — L853 Xerosis cutis: Secondary | ICD-10-CM | POA: Diagnosis not present

## 2017-01-26 DIAGNOSIS — L299 Pruritus, unspecified: Secondary | ICD-10-CM | POA: Diagnosis not present

## 2017-02-25 DIAGNOSIS — F419 Anxiety disorder, unspecified: Secondary | ICD-10-CM | POA: Diagnosis not present

## 2017-02-25 DIAGNOSIS — I10 Essential (primary) hypertension: Secondary | ICD-10-CM | POA: Diagnosis not present

## 2017-02-25 DIAGNOSIS — E785 Hyperlipidemia, unspecified: Secondary | ICD-10-CM | POA: Diagnosis not present

## 2017-02-25 DIAGNOSIS — R7303 Prediabetes: Secondary | ICD-10-CM | POA: Diagnosis not present

## 2017-03-24 DIAGNOSIS — K220 Achalasia of cardia: Secondary | ICD-10-CM | POA: Diagnosis not present

## 2017-04-27 DIAGNOSIS — L400 Psoriasis vulgaris: Secondary | ICD-10-CM | POA: Diagnosis not present

## 2017-04-29 DIAGNOSIS — Z96651 Presence of right artificial knee joint: Secondary | ICD-10-CM | POA: Diagnosis not present

## 2017-04-29 DIAGNOSIS — Z471 Aftercare following joint replacement surgery: Secondary | ICD-10-CM | POA: Diagnosis not present

## 2017-08-09 DIAGNOSIS — H524 Presbyopia: Secondary | ICD-10-CM | POA: Diagnosis not present

## 2017-09-02 DIAGNOSIS — I10 Essential (primary) hypertension: Secondary | ICD-10-CM | POA: Diagnosis not present

## 2017-09-02 DIAGNOSIS — Z1389 Encounter for screening for other disorder: Secondary | ICD-10-CM | POA: Diagnosis not present

## 2017-09-02 DIAGNOSIS — F419 Anxiety disorder, unspecified: Secondary | ICD-10-CM | POA: Diagnosis not present

## 2017-09-02 DIAGNOSIS — Z0000 Encounter for general adult medical examination without abnormal findings: Secondary | ICD-10-CM | POA: Diagnosis not present

## 2017-09-09 ENCOUNTER — Other Ambulatory Visit: Payer: Self-pay | Admitting: Family Medicine

## 2017-09-09 DIAGNOSIS — Z1231 Encounter for screening mammogram for malignant neoplasm of breast: Secondary | ICD-10-CM

## 2017-09-22 DIAGNOSIS — E119 Type 2 diabetes mellitus without complications: Secondary | ICD-10-CM | POA: Diagnosis not present

## 2017-09-29 ENCOUNTER — Ambulatory Visit
Admission: RE | Admit: 2017-09-29 | Discharge: 2017-09-29 | Disposition: A | Discharge status: Home / Self Care | Payer: Medicare Other | Source: Ambulatory Visit | Attending: Family Medicine | Admitting: Family Medicine

## 2017-09-29 DIAGNOSIS — Z1231 Encounter for screening mammogram for malignant neoplasm of breast: Secondary | ICD-10-CM

## 2017-09-30 DIAGNOSIS — E119 Type 2 diabetes mellitus without complications: Secondary | ICD-10-CM | POA: Diagnosis not present

## 2017-10-04 DIAGNOSIS — E1169 Type 2 diabetes mellitus with other specified complication: Secondary | ICD-10-CM | POA: Diagnosis not present

## 2017-10-04 DIAGNOSIS — G4733 Obstructive sleep apnea (adult) (pediatric): Secondary | ICD-10-CM | POA: Diagnosis not present

## 2017-10-12 DIAGNOSIS — G4733 Obstructive sleep apnea (adult) (pediatric): Secondary | ICD-10-CM | POA: Diagnosis not present

## 2017-10-14 DIAGNOSIS — G4733 Obstructive sleep apnea (adult) (pediatric): Secondary | ICD-10-CM | POA: Diagnosis not present

## 2017-11-11 ENCOUNTER — Encounter: Payer: Medicare Other | Attending: Family Medicine | Admitting: Dietician

## 2017-11-11 DIAGNOSIS — E119 Type 2 diabetes mellitus without complications: Secondary | ICD-10-CM | POA: Insufficient documentation

## 2017-11-11 DIAGNOSIS — Z713 Dietary counseling and surveillance: Secondary | ICD-10-CM | POA: Diagnosis not present

## 2017-11-12 DIAGNOSIS — G4733 Obstructive sleep apnea (adult) (pediatric): Secondary | ICD-10-CM | POA: Diagnosis not present

## 2017-11-14 DIAGNOSIS — E119 Type 2 diabetes mellitus without complications: Secondary | ICD-10-CM | POA: Diagnosis not present

## 2017-11-18 ENCOUNTER — Encounter: Payer: Self-pay | Admitting: Dietician

## 2017-11-18 ENCOUNTER — Encounter: Payer: Medicare Other | Attending: Family Medicine | Admitting: Dietician

## 2017-11-18 DIAGNOSIS — Z713 Dietary counseling and surveillance: Secondary | ICD-10-CM | POA: Insufficient documentation

## 2017-11-18 DIAGNOSIS — E119 Type 2 diabetes mellitus without complications: Secondary | ICD-10-CM | POA: Diagnosis not present

## 2017-11-18 NOTE — Progress Notes (Signed)
Patient was seen on 11/18/17 for the second of a series of three diabetes self-management courses at [facility]. The following learning objectives were met by the patient during this class:   Describe the role of different macronutrients on glucose  Explain how carbohydrates affect blood glucose  State what foods contain the most carbohydrates  Demonstrate carbohydrate counting  Demonstrate how to read Nutrition Facts food label  Describe effects of various fats on heart health  Describe the importance of good nutrition for health and healthy eating strategies  Describe techniques for managing your shopping, cooking and meal planning  List strategies to follow meal plan when dining out  Describe the effects of alcohol on glucose and how to use it safely  Goals:  Follow Diabetes Meal Plan as instructed  Aim to spread carbs evenly throughout the day  Aim for 3 meals per day and snacks as needed Include lean protein foods to meals/snacks  Monitor glucose levels as instructed by your doctor   Follow-Up Plan:  Attend Core 3  Work towards following your personal food plan.   

## 2017-11-18 NOTE — Progress Notes (Signed)
Patient was seen on 11/11/17 for the first of a series of three diabetes self-management courses at the [facility].  Patient Education Plan per assessed needs and concerns is to attend three course education program for Diabetes Self Management Education.  The following learning objectives were met by the patient during this class:  Describe diabetes  State some common risk factors for diabetes  Defines the role of glucose and insulin  Identifies type of diabetes and pathophysiology  Describe the relationship between diabetes and cardiovascular risk  State the members of the Healthcare Team  States the rationale for glucose monitoring  State when to test glucose  State their individual Target Range  State the importance of logging glucose readings  Describe how to interpret glucose readings  Identifies A1C target  Explain the correlation between A1c and eAG values  State symptoms and treatment of high blood glucose  State symptoms and treatment of low blood glucose  Explain proper technique for glucose testing  Identifies proper sharps disposal  Handouts given during class include:  ADA Diabetes You Take Control   Carb Counting and Meal Planning book  Meal Plan Card  Meal planning worksheet  Low Sodium Flavoring Tips  Types of Fats  The diabetes portion plate  A1c to eAG Conversion Chart  Diabetes Recommended Care Schedule  Support Group  Diabetes Success Plan  Core Class Satisfaction Survey   Follow-Up Plan:  Attend core 2   

## 2017-11-19 DIAGNOSIS — G4733 Obstructive sleep apnea (adult) (pediatric): Secondary | ICD-10-CM | POA: Diagnosis not present

## 2017-11-25 ENCOUNTER — Ambulatory Visit: Payer: Medicare Other

## 2017-12-03 DIAGNOSIS — R946 Abnormal results of thyroid function studies: Secondary | ICD-10-CM | POA: Diagnosis not present

## 2017-12-13 DIAGNOSIS — G4733 Obstructive sleep apnea (adult) (pediatric): Secondary | ICD-10-CM | POA: Diagnosis not present

## 2017-12-16 ENCOUNTER — Encounter: Payer: Self-pay | Admitting: Dietician

## 2017-12-16 ENCOUNTER — Encounter: Payer: Medicare Other | Attending: Family Medicine | Admitting: Dietician

## 2017-12-16 DIAGNOSIS — Z713 Dietary counseling and surveillance: Secondary | ICD-10-CM | POA: Diagnosis not present

## 2017-12-16 DIAGNOSIS — E119 Type 2 diabetes mellitus without complications: Secondary | ICD-10-CM | POA: Insufficient documentation

## 2017-12-16 NOTE — Progress Notes (Signed)
Patient was seen on 12/16/17 for the third of a series of three diabetes self-management courses at the [facility].   Janene Madeira the amount of activity recommended for healthy living . Describe activities suitable for individual needs . Identify ways to regularly incorporate activity into daily life . Identify barriers to activity and ways to over come these barriers  Identify diabetes medications being personally used and their primary action for lowering glucose and possible side effects . Describe role of stress on blood glucose and develop strategies to address psychosocial issues . Identify diabetes complications and ways to prevent them  Explain how to manage diabetes during illness . Evaluate success in meeting personal goal . Establish 2-3 goals that they will plan to diligently work on  Goals:   I will count my carb choices at most meals and snacks  I will be active 30 minutes or more 4 times a week  I will eat less unhealthy fats by eating less sugar & fried foods  I will test my glucose at least 2 times a day, 7 days a week  To help manage stress I will have more "me time" at least 4 times a week  Your patient has identified these potential barriers to change:  Physical limitations to activity  Your patient has identified their diabetes self-care support plan as   American Diabetes Association Website    Plan:  Attend Support Group as desired

## 2017-12-21 DIAGNOSIS — E119 Type 2 diabetes mellitus without complications: Secondary | ICD-10-CM | POA: Diagnosis not present

## 2017-12-27 DIAGNOSIS — E119 Type 2 diabetes mellitus without complications: Secondary | ICD-10-CM | POA: Diagnosis not present

## 2018-01-05 DIAGNOSIS — G4733 Obstructive sleep apnea (adult) (pediatric): Secondary | ICD-10-CM | POA: Diagnosis not present

## 2018-01-12 DIAGNOSIS — G4733 Obstructive sleep apnea (adult) (pediatric): Secondary | ICD-10-CM | POA: Diagnosis not present

## 2018-01-20 DIAGNOSIS — E119 Type 2 diabetes mellitus without complications: Secondary | ICD-10-CM | POA: Diagnosis not present

## 2018-02-22 DIAGNOSIS — K220 Achalasia of cardia: Secondary | ICD-10-CM | POA: Diagnosis not present

## 2018-02-22 DIAGNOSIS — K5900 Constipation, unspecified: Secondary | ICD-10-CM | POA: Diagnosis not present

## 2018-02-22 DIAGNOSIS — Z86010 Personal history of colonic polyps: Secondary | ICD-10-CM | POA: Diagnosis not present

## 2018-03-07 DIAGNOSIS — E785 Hyperlipidemia, unspecified: Secondary | ICD-10-CM | POA: Diagnosis not present

## 2018-03-07 DIAGNOSIS — I10 Essential (primary) hypertension: Secondary | ICD-10-CM | POA: Diagnosis not present

## 2018-03-07 DIAGNOSIS — E119 Type 2 diabetes mellitus without complications: Secondary | ICD-10-CM | POA: Diagnosis not present

## 2018-03-07 DIAGNOSIS — F419 Anxiety disorder, unspecified: Secondary | ICD-10-CM | POA: Diagnosis not present

## 2018-03-09 DIAGNOSIS — E119 Type 2 diabetes mellitus without complications: Secondary | ICD-10-CM | POA: Diagnosis not present

## 2018-04-15 DIAGNOSIS — Z86010 Personal history of colonic polyps: Secondary | ICD-10-CM | POA: Diagnosis not present

## 2018-04-15 DIAGNOSIS — R194 Change in bowel habit: Secondary | ICD-10-CM | POA: Diagnosis not present

## 2018-04-15 DIAGNOSIS — K5730 Diverticulosis of large intestine without perforation or abscess without bleeding: Secondary | ICD-10-CM | POA: Diagnosis not present

## 2018-04-15 DIAGNOSIS — K5900 Constipation, unspecified: Secondary | ICD-10-CM | POA: Diagnosis not present

## 2018-05-24 DIAGNOSIS — L299 Pruritus, unspecified: Secondary | ICD-10-CM | POA: Diagnosis not present

## 2018-05-24 DIAGNOSIS — L400 Psoriasis vulgaris: Secondary | ICD-10-CM | POA: Diagnosis not present

## 2018-06-06 DIAGNOSIS — E119 Type 2 diabetes mellitus without complications: Secondary | ICD-10-CM | POA: Diagnosis not present

## 2018-06-06 DIAGNOSIS — I10 Essential (primary) hypertension: Secondary | ICD-10-CM | POA: Diagnosis not present

## 2018-06-21 DIAGNOSIS — G4733 Obstructive sleep apnea (adult) (pediatric): Secondary | ICD-10-CM | POA: Diagnosis not present

## 2018-06-22 DIAGNOSIS — E119 Type 2 diabetes mellitus without complications: Secondary | ICD-10-CM | POA: Diagnosis not present

## 2018-08-13 DIAGNOSIS — E119 Type 2 diabetes mellitus without complications: Secondary | ICD-10-CM | POA: Diagnosis not present

## 2018-08-18 ENCOUNTER — Other Ambulatory Visit: Payer: Self-pay | Admitting: Family Medicine

## 2018-08-18 DIAGNOSIS — Z1231 Encounter for screening mammogram for malignant neoplasm of breast: Secondary | ICD-10-CM

## 2018-08-23 DIAGNOSIS — H524 Presbyopia: Secondary | ICD-10-CM | POA: Diagnosis not present

## 2018-08-23 DIAGNOSIS — H353121 Nonexudative age-related macular degeneration, left eye, early dry stage: Secondary | ICD-10-CM | POA: Diagnosis not present

## 2018-08-31 DIAGNOSIS — K5900 Constipation, unspecified: Secondary | ICD-10-CM | POA: Diagnosis not present

## 2018-08-31 DIAGNOSIS — K644 Residual hemorrhoidal skin tags: Secondary | ICD-10-CM | POA: Diagnosis not present

## 2018-09-15 DIAGNOSIS — I10 Essential (primary) hypertension: Secondary | ICD-10-CM | POA: Diagnosis not present

## 2018-09-15 DIAGNOSIS — E785 Hyperlipidemia, unspecified: Secondary | ICD-10-CM | POA: Diagnosis not present

## 2018-09-15 DIAGNOSIS — R233 Spontaneous ecchymoses: Secondary | ICD-10-CM | POA: Diagnosis not present

## 2018-09-22 DIAGNOSIS — Z1389 Encounter for screening for other disorder: Secondary | ICD-10-CM | POA: Diagnosis not present

## 2018-09-22 DIAGNOSIS — Z0000 Encounter for general adult medical examination without abnormal findings: Secondary | ICD-10-CM | POA: Diagnosis not present

## 2018-09-22 DIAGNOSIS — E785 Hyperlipidemia, unspecified: Secondary | ICD-10-CM | POA: Diagnosis not present

## 2018-09-22 DIAGNOSIS — I10 Essential (primary) hypertension: Secondary | ICD-10-CM | POA: Diagnosis not present

## 2018-10-04 ENCOUNTER — Other Ambulatory Visit: Payer: Self-pay

## 2018-10-04 ENCOUNTER — Ambulatory Visit
Admission: RE | Admit: 2018-10-04 | Discharge: 2018-10-04 | Disposition: A | Discharge status: Home / Self Care | Payer: Medicare Other | Source: Ambulatory Visit | Attending: Family Medicine | Admitting: Family Medicine

## 2018-10-04 DIAGNOSIS — Z1231 Encounter for screening mammogram for malignant neoplasm of breast: Secondary | ICD-10-CM | POA: Diagnosis not present

## 2018-10-12 DIAGNOSIS — G4733 Obstructive sleep apnea (adult) (pediatric): Secondary | ICD-10-CM | POA: Diagnosis not present

## 2018-10-21 DIAGNOSIS — Z86010 Personal history of colonic polyps: Secondary | ICD-10-CM | POA: Diagnosis not present

## 2018-10-21 DIAGNOSIS — K644 Residual hemorrhoidal skin tags: Secondary | ICD-10-CM | POA: Diagnosis not present

## 2018-10-21 DIAGNOSIS — K5900 Constipation, unspecified: Secondary | ICD-10-CM | POA: Diagnosis not present

## 2019-01-03 DIAGNOSIS — L400 Psoriasis vulgaris: Secondary | ICD-10-CM | POA: Diagnosis not present

## 2019-01-03 DIAGNOSIS — L820 Inflamed seborrheic keratosis: Secondary | ICD-10-CM | POA: Diagnosis not present

## 2019-01-03 DIAGNOSIS — L299 Pruritus, unspecified: Secondary | ICD-10-CM | POA: Diagnosis not present

## 2019-01-03 DIAGNOSIS — L853 Xerosis cutis: Secondary | ICD-10-CM | POA: Diagnosis not present

## 2019-01-19 DIAGNOSIS — G4733 Obstructive sleep apnea (adult) (pediatric): Secondary | ICD-10-CM | POA: Diagnosis not present

## 2019-02-03 DIAGNOSIS — L400 Psoriasis vulgaris: Secondary | ICD-10-CM | POA: Diagnosis not present

## 2019-04-04 DIAGNOSIS — L400 Psoriasis vulgaris: Secondary | ICD-10-CM | POA: Diagnosis not present

## 2019-04-04 DIAGNOSIS — L299 Pruritus, unspecified: Secondary | ICD-10-CM | POA: Diagnosis not present

## 2019-06-30 ENCOUNTER — Other Ambulatory Visit: Payer: Self-pay

## 2019-06-30 ENCOUNTER — Other Ambulatory Visit: Payer: Self-pay | Admitting: Physician Assistant

## 2019-06-30 ENCOUNTER — Ambulatory Visit
Admission: RE | Admit: 2019-06-30 | Discharge: 2019-06-30 | Disposition: A | Discharge status: Home / Self Care | Payer: Medicare Other | Source: Ambulatory Visit | Attending: Physician Assistant | Admitting: Physician Assistant

## 2019-06-30 DIAGNOSIS — R52 Pain, unspecified: Secondary | ICD-10-CM

## 2019-06-30 DIAGNOSIS — M25511 Pain in right shoulder: Secondary | ICD-10-CM | POA: Diagnosis not present

## 2019-07-13 DIAGNOSIS — R2681 Unsteadiness on feet: Secondary | ICD-10-CM | POA: Diagnosis not present

## 2019-07-13 DIAGNOSIS — M25511 Pain in right shoulder: Secondary | ICD-10-CM | POA: Diagnosis not present

## 2019-07-17 DIAGNOSIS — M25511 Pain in right shoulder: Secondary | ICD-10-CM | POA: Diagnosis not present

## 2019-07-17 DIAGNOSIS — R2681 Unsteadiness on feet: Secondary | ICD-10-CM | POA: Diagnosis not present

## 2019-07-24 DIAGNOSIS — M25511 Pain in right shoulder: Secondary | ICD-10-CM | POA: Diagnosis not present

## 2019-07-24 DIAGNOSIS — R2681 Unsteadiness on feet: Secondary | ICD-10-CM | POA: Diagnosis not present

## 2019-07-28 DIAGNOSIS — M25511 Pain in right shoulder: Secondary | ICD-10-CM | POA: Diagnosis not present

## 2019-07-28 DIAGNOSIS — R2681 Unsteadiness on feet: Secondary | ICD-10-CM | POA: Diagnosis not present

## 2019-07-31 DIAGNOSIS — R2681 Unsteadiness on feet: Secondary | ICD-10-CM | POA: Diagnosis not present

## 2019-07-31 DIAGNOSIS — M25511 Pain in right shoulder: Secondary | ICD-10-CM | POA: Diagnosis not present

## 2019-09-04 ENCOUNTER — Other Ambulatory Visit: Payer: Self-pay | Admitting: Family Medicine

## 2019-09-04 DIAGNOSIS — Z1231 Encounter for screening mammogram for malignant neoplasm of breast: Secondary | ICD-10-CM

## 2019-09-05 DIAGNOSIS — Z79899 Other long term (current) drug therapy: Secondary | ICD-10-CM | POA: Diagnosis not present

## 2019-09-05 DIAGNOSIS — L299 Pruritus, unspecified: Secondary | ICD-10-CM | POA: Diagnosis not present

## 2019-09-05 DIAGNOSIS — L400 Psoriasis vulgaris: Secondary | ICD-10-CM | POA: Diagnosis not present

## 2019-10-04 DIAGNOSIS — Z1389 Encounter for screening for other disorder: Secondary | ICD-10-CM | POA: Diagnosis not present

## 2019-10-04 DIAGNOSIS — Z0000 Encounter for general adult medical examination without abnormal findings: Secondary | ICD-10-CM | POA: Diagnosis not present

## 2019-10-04 DIAGNOSIS — E7439 Other disorders of intestinal carbohydrate absorption: Secondary | ICD-10-CM | POA: Diagnosis not present

## 2019-10-04 DIAGNOSIS — I10 Essential (primary) hypertension: Secondary | ICD-10-CM | POA: Diagnosis not present

## 2019-10-05 ENCOUNTER — Ambulatory Visit
Admission: RE | Admit: 2019-10-05 | Discharge: 2019-10-05 | Disposition: A | Discharge status: Home / Self Care | Payer: Medicare Other | Source: Ambulatory Visit | Attending: Family Medicine | Admitting: Family Medicine

## 2019-10-05 ENCOUNTER — Other Ambulatory Visit: Payer: Self-pay

## 2019-10-05 DIAGNOSIS — Z1231 Encounter for screening mammogram for malignant neoplasm of breast: Secondary | ICD-10-CM | POA: Diagnosis not present

## 2019-10-10 ENCOUNTER — Other Ambulatory Visit: Payer: Self-pay | Admitting: Family Medicine

## 2019-10-10 DIAGNOSIS — R928 Other abnormal and inconclusive findings on diagnostic imaging of breast: Secondary | ICD-10-CM

## 2019-10-13 ENCOUNTER — Ambulatory Visit
Admission: RE | Admit: 2019-10-13 | Discharge: 2019-10-13 | Disposition: A | Discharge status: Home / Self Care | Payer: Medicare Other | Source: Ambulatory Visit | Attending: Family Medicine | Admitting: Family Medicine

## 2019-10-13 ENCOUNTER — Other Ambulatory Visit: Payer: Self-pay

## 2019-10-13 DIAGNOSIS — N6489 Other specified disorders of breast: Secondary | ICD-10-CM | POA: Diagnosis not present

## 2019-10-13 DIAGNOSIS — R921 Mammographic calcification found on diagnostic imaging of breast: Secondary | ICD-10-CM | POA: Diagnosis not present

## 2019-10-13 DIAGNOSIS — R928 Other abnormal and inconclusive findings on diagnostic imaging of breast: Secondary | ICD-10-CM

## 2019-11-29 DIAGNOSIS — W19XXXA Unspecified fall, initial encounter: Secondary | ICD-10-CM | POA: Diagnosis not present

## 2019-11-29 DIAGNOSIS — S3992XA Unspecified injury of lower back, initial encounter: Secondary | ICD-10-CM | POA: Diagnosis not present

## 2019-12-07 ENCOUNTER — Other Ambulatory Visit: Payer: Self-pay | Admitting: Neurosurgery

## 2019-12-07 DIAGNOSIS — M4316 Spondylolisthesis, lumbar region: Secondary | ICD-10-CM | POA: Diagnosis not present

## 2019-12-07 DIAGNOSIS — M5136 Other intervertebral disc degeneration, lumbar region: Secondary | ICD-10-CM | POA: Diagnosis not present

## 2019-12-07 DIAGNOSIS — M545 Low back pain: Secondary | ICD-10-CM | POA: Diagnosis not present

## 2019-12-07 DIAGNOSIS — S32020A Wedge compression fracture of second lumbar vertebra, initial encounter for closed fracture: Secondary | ICD-10-CM | POA: Diagnosis not present

## 2019-12-11 ENCOUNTER — Ambulatory Visit
Admission: RE | Admit: 2019-12-11 | Discharge: 2019-12-11 | Disposition: A | Discharge status: Home / Self Care | Payer: Medicare Other | Source: Ambulatory Visit | Attending: Neurosurgery | Admitting: Neurosurgery

## 2019-12-11 ENCOUNTER — Other Ambulatory Visit: Payer: Self-pay

## 2019-12-11 DIAGNOSIS — S32020A Wedge compression fracture of second lumbar vertebra, initial encounter for closed fracture: Secondary | ICD-10-CM

## 2019-12-14 DIAGNOSIS — H524 Presbyopia: Secondary | ICD-10-CM | POA: Diagnosis not present

## 2020-01-17 DIAGNOSIS — Z6831 Body mass index (BMI) 31.0-31.9, adult: Secondary | ICD-10-CM | POA: Diagnosis not present

## 2020-01-17 DIAGNOSIS — M545 Low back pain, unspecified: Secondary | ICD-10-CM | POA: Diagnosis not present

## 2020-01-17 DIAGNOSIS — M4316 Spondylolisthesis, lumbar region: Secondary | ICD-10-CM | POA: Diagnosis not present

## 2020-01-17 DIAGNOSIS — S32020A Wedge compression fracture of second lumbar vertebra, initial encounter for closed fracture: Secondary | ICD-10-CM | POA: Diagnosis not present

## 2020-02-19 DIAGNOSIS — M5136 Other intervertebral disc degeneration, lumbar region: Secondary | ICD-10-CM | POA: Diagnosis not present

## 2020-02-19 DIAGNOSIS — S32020A Wedge compression fracture of second lumbar vertebra, initial encounter for closed fracture: Secondary | ICD-10-CM | POA: Diagnosis not present

## 2020-02-19 DIAGNOSIS — M545 Low back pain, unspecified: Secondary | ICD-10-CM | POA: Diagnosis not present

## 2020-02-19 DIAGNOSIS — M4316 Spondylolisthesis, lumbar region: Secondary | ICD-10-CM | POA: Diagnosis not present

## 2020-04-08 DIAGNOSIS — I10 Essential (primary) hypertension: Secondary | ICD-10-CM | POA: Diagnosis not present

## 2020-04-08 DIAGNOSIS — E785 Hyperlipidemia, unspecified: Secondary | ICD-10-CM | POA: Diagnosis not present

## 2020-04-08 DIAGNOSIS — E669 Obesity, unspecified: Secondary | ICD-10-CM | POA: Diagnosis not present

## 2020-04-08 DIAGNOSIS — F419 Anxiety disorder, unspecified: Secondary | ICD-10-CM | POA: Diagnosis not present

## 2020-04-26 DIAGNOSIS — I10 Essential (primary) hypertension: Secondary | ICD-10-CM | POA: Diagnosis not present

## 2020-04-26 DIAGNOSIS — E119 Type 2 diabetes mellitus without complications: Secondary | ICD-10-CM | POA: Diagnosis not present

## 2020-06-14 DIAGNOSIS — L820 Inflamed seborrheic keratosis: Secondary | ICD-10-CM | POA: Diagnosis not present

## 2020-06-14 DIAGNOSIS — L400 Psoriasis vulgaris: Secondary | ICD-10-CM | POA: Diagnosis not present

## 2020-06-14 DIAGNOSIS — Z79899 Other long term (current) drug therapy: Secondary | ICD-10-CM | POA: Diagnosis not present

## 2020-06-14 DIAGNOSIS — L853 Xerosis cutis: Secondary | ICD-10-CM | POA: Diagnosis not present

## 2020-06-14 DIAGNOSIS — L299 Pruritus, unspecified: Secondary | ICD-10-CM | POA: Diagnosis not present

## 2020-06-21 DIAGNOSIS — R5383 Other fatigue: Secondary | ICD-10-CM | POA: Diagnosis not present

## 2020-06-21 DIAGNOSIS — E559 Vitamin D deficiency, unspecified: Secondary | ICD-10-CM | POA: Diagnosis not present

## 2020-06-27 DIAGNOSIS — I10 Essential (primary) hypertension: Secondary | ICD-10-CM | POA: Diagnosis not present

## 2020-06-27 DIAGNOSIS — E785 Hyperlipidemia, unspecified: Secondary | ICD-10-CM | POA: Diagnosis not present

## 2020-06-27 DIAGNOSIS — R5383 Other fatigue: Secondary | ICD-10-CM | POA: Diagnosis not present

## 2020-07-10 DIAGNOSIS — M545 Low back pain, unspecified: Secondary | ICD-10-CM | POA: Diagnosis not present

## 2020-07-11 DIAGNOSIS — R42 Dizziness and giddiness: Secondary | ICD-10-CM | POA: Diagnosis not present

## 2020-07-11 DIAGNOSIS — M545 Low back pain, unspecified: Secondary | ICD-10-CM | POA: Diagnosis not present

## 2020-07-11 DIAGNOSIS — R296 Repeated falls: Secondary | ICD-10-CM | POA: Diagnosis not present

## 2020-07-15 ENCOUNTER — Other Ambulatory Visit: Payer: Self-pay | Admitting: Physician Assistant

## 2020-07-15 DIAGNOSIS — R42 Dizziness and giddiness: Secondary | ICD-10-CM

## 2020-07-15 DIAGNOSIS — R296 Repeated falls: Secondary | ICD-10-CM

## 2020-07-15 DIAGNOSIS — Z8489 Family history of other specified conditions: Secondary | ICD-10-CM

## 2020-07-31 ENCOUNTER — Other Ambulatory Visit: Payer: Self-pay

## 2020-07-31 ENCOUNTER — Ambulatory Visit
Admission: RE | Admit: 2020-07-31 | Discharge: 2020-07-31 | Disposition: A | Discharge status: Home / Self Care | Payer: Medicare Other | Source: Ambulatory Visit | Attending: Physician Assistant | Admitting: Physician Assistant

## 2020-07-31 DIAGNOSIS — M2548 Effusion, other site: Secondary | ICD-10-CM | POA: Diagnosis not present

## 2020-07-31 DIAGNOSIS — R42 Dizziness and giddiness: Secondary | ICD-10-CM | POA: Diagnosis not present

## 2020-07-31 DIAGNOSIS — R296 Repeated falls: Secondary | ICD-10-CM | POA: Diagnosis not present

## 2020-07-31 DIAGNOSIS — I6782 Cerebral ischemia: Secondary | ICD-10-CM | POA: Diagnosis not present

## 2020-07-31 DIAGNOSIS — Z8489 Family history of other specified conditions: Secondary | ICD-10-CM

## 2020-07-31 MED ORDER — GADOBENATE DIMEGLUMINE 529 MG/ML IV SOLN
14.0000 mL | Freq: Once | INTRAVENOUS | Status: AC | PRN
  Administered 2020-07-31: 14 mL via INTRAVENOUS

## 2020-09-04 ENCOUNTER — Other Ambulatory Visit: Payer: Self-pay | Admitting: Family Medicine

## 2020-09-04 DIAGNOSIS — Z1231 Encounter for screening mammogram for malignant neoplasm of breast: Secondary | ICD-10-CM

## 2020-09-04 IMAGING — MG DIGITAL SCREENING BILATERAL MAMMOGRAM WITH TOMO AND CAD
8 series · 8 of 24 positions shown · non-contrast
Comparison: Previous exam(s).

CLINICAL DATA: Screening.

EXAM:
DIGITAL SCREENING BILATERAL MAMMOGRAM WITH TOMO AND CAD

[R MLO synth-2D]
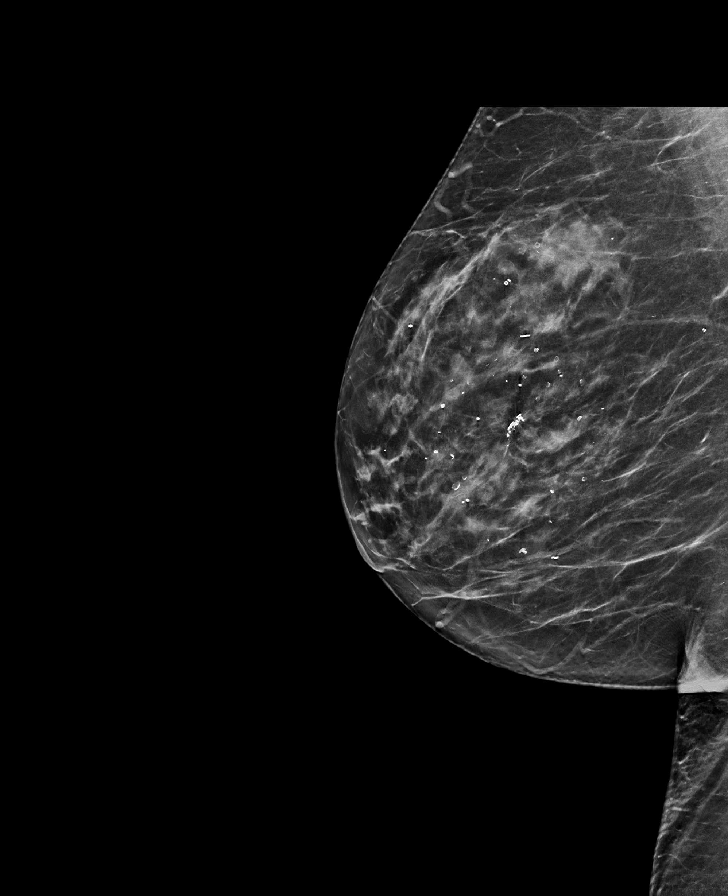

[L MLO synth-2D]
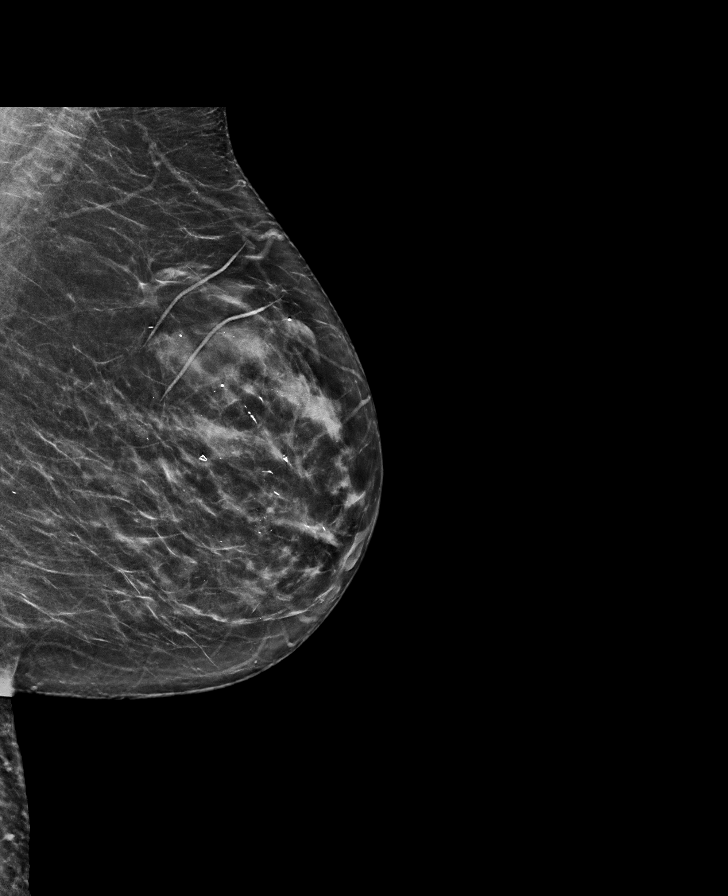

[R CC synth-2D]
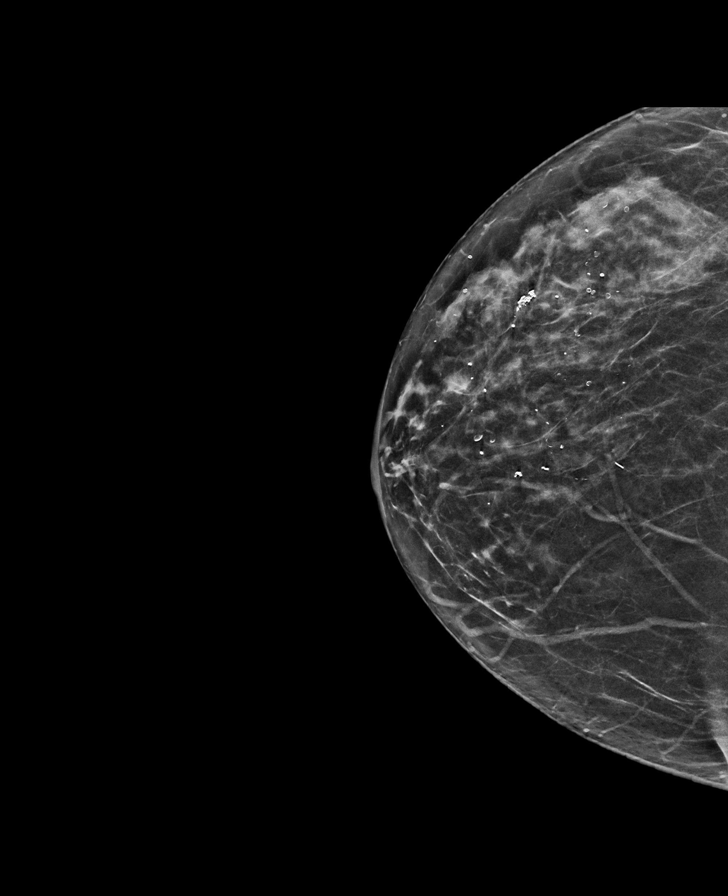

[L CC synth-2D]
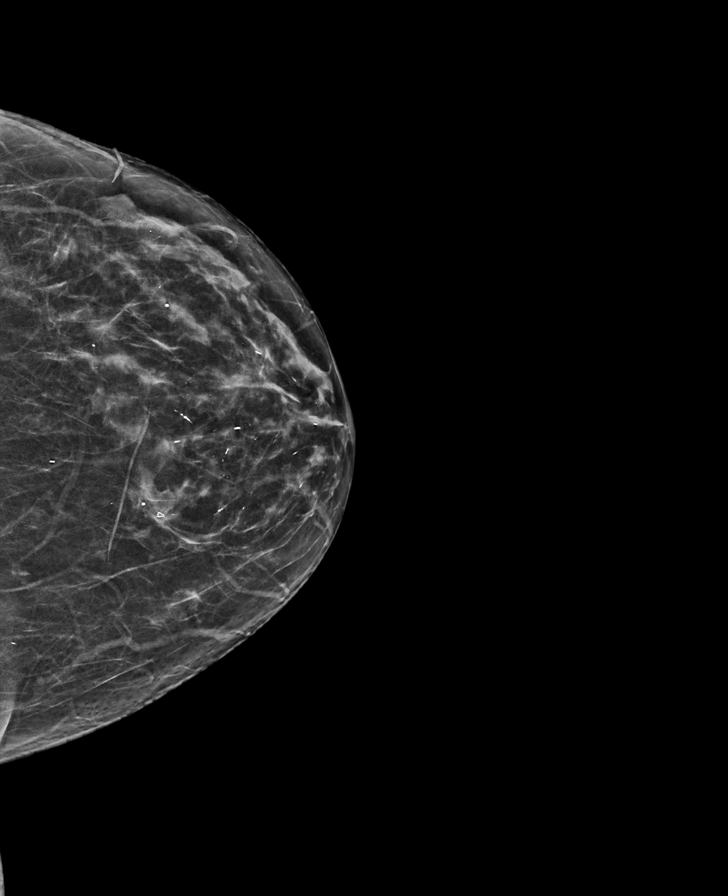

[L MLO tomo · tomo slice 35/68.0]
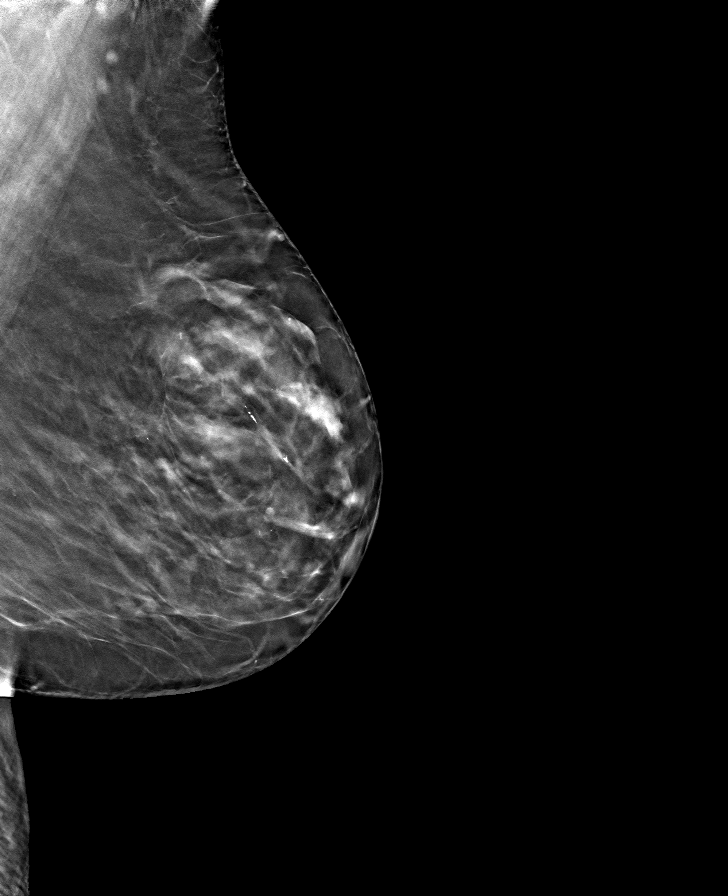

[L CC tomo · tomo slice 31/61.0]
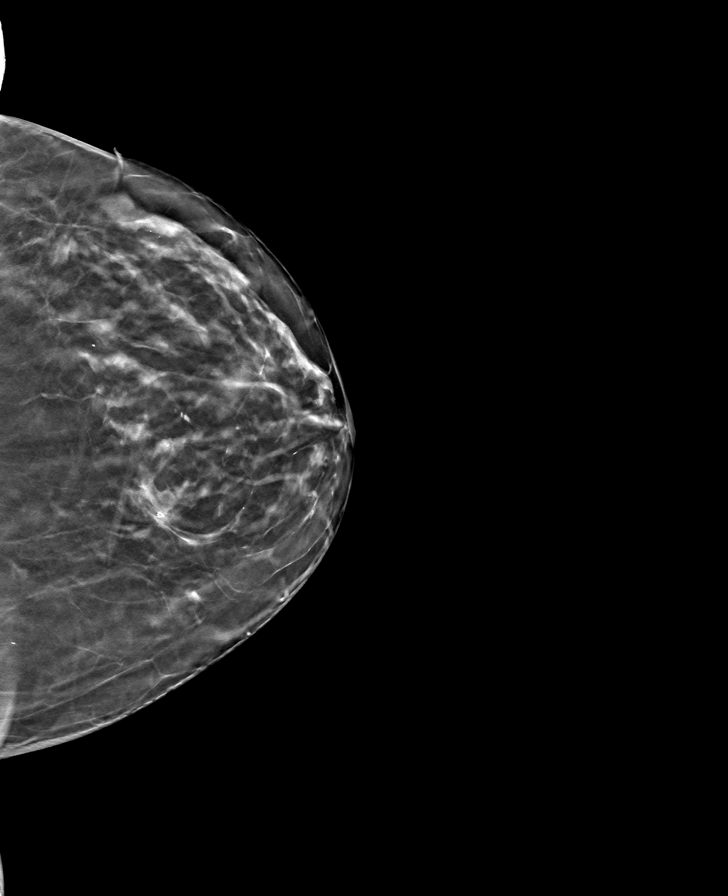

[R CC tomo · tomo slice 32/63.0]
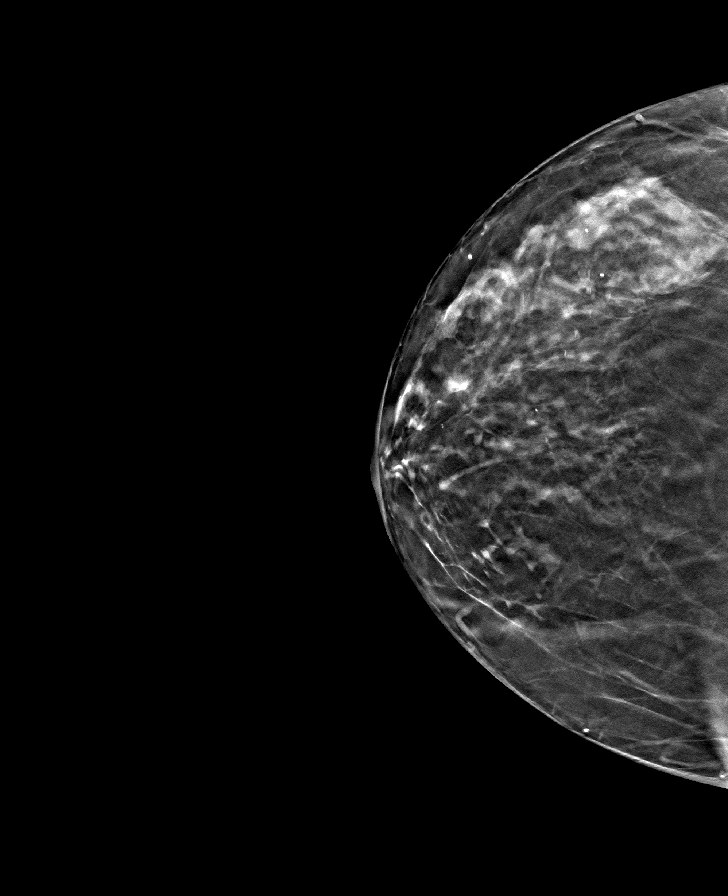

[R MLO tomo · tomo slice 35/70.0]
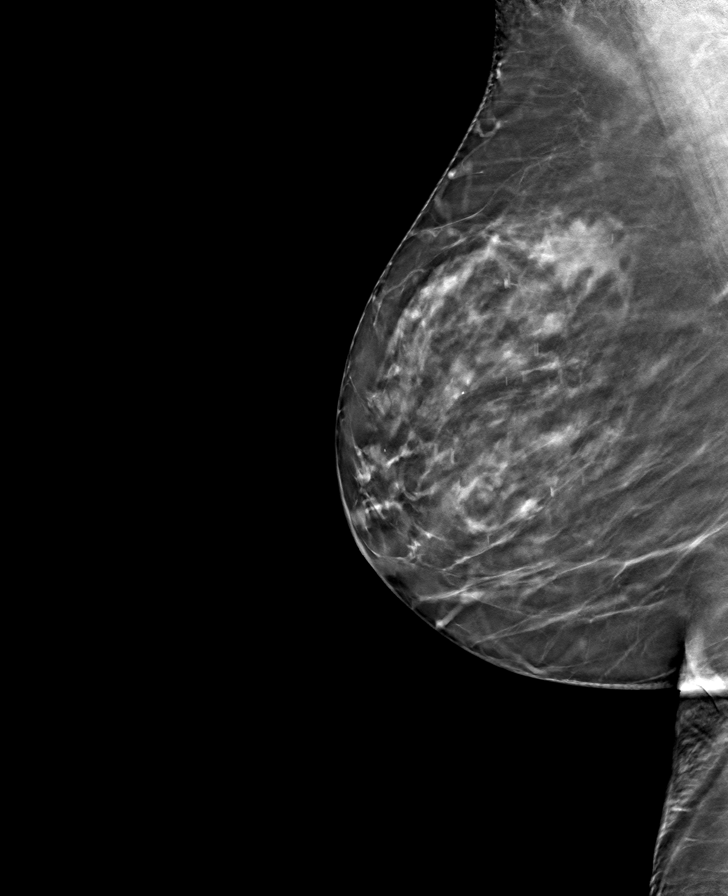

[8 of 24 positions shown; findings below may reference images not displayed]

ACR Breast Density Category c: The breast tissue is heterogeneously
dense, which may obscure small masses.
FINDINGS: There are no findings suspicious for malignancy. Images were
processed with CAD.
IMPRESSION: No mammographic evidence of malignancy. A result letter of this
screening mammogram will be mailed directly to the patient.

RECOMMENDATION:
Screening mammogram in one year. (Code:[id number])

BI-RADS CATEGORY  1: Negative.

## 2020-10-10 DIAGNOSIS — E785 Hyperlipidemia, unspecified: Secondary | ICD-10-CM | POA: Diagnosis not present

## 2020-10-10 DIAGNOSIS — E119 Type 2 diabetes mellitus without complications: Secondary | ICD-10-CM | POA: Diagnosis not present

## 2020-10-10 DIAGNOSIS — Z1389 Encounter for screening for other disorder: Secondary | ICD-10-CM | POA: Diagnosis not present

## 2020-10-10 DIAGNOSIS — Z1159 Encounter for screening for other viral diseases: Secondary | ICD-10-CM | POA: Diagnosis not present

## 2020-10-10 DIAGNOSIS — E559 Vitamin D deficiency, unspecified: Secondary | ICD-10-CM | POA: Diagnosis not present

## 2020-10-10 DIAGNOSIS — I10 Essential (primary) hypertension: Secondary | ICD-10-CM | POA: Diagnosis not present

## 2020-10-10 DIAGNOSIS — Z0000 Encounter for general adult medical examination without abnormal findings: Secondary | ICD-10-CM | POA: Diagnosis not present

## 2020-10-30 ENCOUNTER — Ambulatory Visit
Admission: RE | Admit: 2020-10-30 | Discharge: 2020-10-30 | Disposition: A | Discharge status: Home / Self Care | Payer: Medicare Other | Source: Ambulatory Visit | Attending: Family Medicine | Admitting: Family Medicine

## 2020-10-30 ENCOUNTER — Other Ambulatory Visit: Payer: Self-pay

## 2020-10-30 ENCOUNTER — Encounter: Payer: Self-pay | Admitting: *Deleted

## 2020-10-30 DIAGNOSIS — Z1231 Encounter for screening mammogram for malignant neoplasm of breast: Secondary | ICD-10-CM

## 2020-10-30 HISTORY — DX: Other amnesia: R41.3

## 2020-10-30 HISTORY — DX: Carpal tunnel syndrome, unspecified upper limb: G56.00

## 2020-10-30 HISTORY — DX: Repeated falls: R29.6

## 2020-10-30 HISTORY — DX: Dizziness and giddiness: R42

## 2020-11-05 ENCOUNTER — Encounter: Payer: Self-pay | Admitting: Diagnostic Neuroimaging

## 2020-11-05 ENCOUNTER — Ambulatory Visit: Payer: Medicare Other | Admitting: Diagnostic Neuroimaging

## 2020-11-05 VITALS — BP 126/62 | HR 76 | Ht 67.0 in

## 2020-11-05 DIAGNOSIS — R269 Unspecified abnormalities of gait and mobility: Secondary | ICD-10-CM | POA: Diagnosis not present

## 2020-11-05 DIAGNOSIS — R413 Other amnesia: Secondary | ICD-10-CM

## 2020-11-05 NOTE — Progress Notes (Signed)
[facility]  PATIENT: Tiffany Burke DOB: 1940-10-17  REFERRING CLINICIAN: Adrienne Mocha, PA HISTORY FROM: patient and son REASON FOR VISIT: new consult   HISTORICAL  CHIEF COMPLAINT:  Chief Complaint  Patient presents with   New Patient (Initial Visit)    Rm 6 with son Sherrine Maples. Pt reports she is here to discuss worsening falls and confusion.     HISTORY OF PRESENT ILLNESS:   80 year old female here for evaluation of gait and balance difficulty.  Patient is having intermittent episodes of lightheadedness, dizziness, falling backwards.  Symptoms have been present since at least 2018 and worsening over time.  She has history of low back pain, bilateral knee replacements, general physical decline.  She has had increasing falls recently with bruises and injuries.  She went to the emergency room for evaluation and has had lumbar spine compression fracture diagnosed which was managed conservatively.  Patient also having some increasing mild memory loss and forgetfulness.  No major changes in ADLs due to memory issues.  ADLs have slightly changed due to her physical limitations.   REVIEW OF SYSTEMS: Full 14 system review of systems performed and negative with exception of: As per HPI.  ALLERGIES: Allergies  Allergen Reactions   Penicillins Anaphylaxis   Vancomycin Rash    HOME MEDICATIONS: Outpatient Medications Prior to Visit  Medication Sig Dispense Refill   acetaminophen (TYLENOL) 325 MG tablet Take 650 mg by mouth every 6 (six) hours as needed.     Apoaequorin (PREVAGEN PO) Take by mouth.     atorvastatin (LIPITOR) 40 MG tablet Take 20 mg by mouth at bedtime.     citalopram (CELEXA) 40 MG tablet Take 40 mg by mouth every morning.      clobetasol (TEMOVATE) 0.05 % external solution Apply 1 application topically as needed.      Cyanocobalamin (VITAMIN B 12 PO) Take by mouth.     diphenhydrAMINE (BENADRYL) 25 MG tablet Take 25 mg by mouth daily  as needed for allergies (in the am).     hydrochlorothiazide (MICROZIDE) 12.5 MG capsule Take 12.5 mg by mouth every morning.      Multiple Vitamins-Minerals (PRESERVISION AREDS 2 PO) Take by mouth.     omeprazole (PRILOSEC) 20 MG capsule Take 20 mg by mouth every morning.      VITAMIN D PO Take by mouth.     Vitamin D, Ergocalciferol, (DRISDOL) 50000 UNITS CAPS capsule Take 5,000 Units by mouth every 7 (seven) days. (Patient not taking: Reported on 11/05/2020)     methocarbamol (ROBAXIN) 500 MG tablet Take 1 tablet (500 mg total) by mouth every 6 (six) hours as needed for muscle spasms. (Patient not taking: Reported on 01/18/2015) 80 tablet 0   metoprolol (LOPRESSOR) 100 MG tablet Take 100 mg by mouth every morning.  (Patient not taking: Reported on 11/05/2020)     NITROSTAT 0.4 MG SL tablet PLACE 1 TABLET UNDER THE TONGUE EVERY 5 MINUTES X 3 DOSES AS NEEDED FOR CHEST PAIN 25 tablet 2   oxyCODONE (OXY IR/ROXICODONE) 5 MG immediate release tablet Take 1-2 tablets (5-10 mg total) by mouth every 3 (three) hours as needed for moderate pain, severe pain or breakthrough pain. (Patient not taking: Reported on 01/18/2015) 90 tablet 0   rivaroxaban (XARELTO) 10 MG TABS tablet Take 1 tablet (10 mg total) by mouth daily with breakfast. .Take Xarelto for two and a half more weeks, then discontinue Xarelto. Once the patient has completed the Xarelto, they may resume  the 81 mg Aspirin. (Patient not taking: Reported on 01/18/2015) 19 tablet 0   traMADol (ULTRAM) 50 MG tablet Take 1-2 tablets (50-100 mg total) by mouth every 6 (six) hours as needed (mild pain). (Patient not taking: Reported on 01/18/2015) 80 tablet 1   No facility-administered medications prior to visit.    PAST MEDICAL HISTORY: Past Medical History:  Diagnosis Date   Anginal pain (HCC)    "went to ED-complete work up and everything was fine"   Anxiety    Borderline diabetes    Carotid artery disease (HCC)    a. 09/2011 Carotid U/S: Eccentric  plaque bilat carotid bulbs & origin of bilat ICA's->50-69% on right with borderline elvation of velocities on left.  Degree of narrowing appeared more severe than acquired by velocities.   Carpal tunnel syndrome    Chest pain    a. admx with CP 11/13 => Lexiscan MV 01/25/12:  No ischemia, EF 72%.     Colon polyps    Depression    Diabetes mellitus without complication (HCC)    Ejection Fraction    a. Echo 01/25/12: mild LVH, EF 55-60%, Gr 1 diast dysfn.    Frequent falls    GERD (gastroesophageal reflux disease)    History of kidney stones    none since age 80   Hypercholesteremia    a. Treated x 5 yrs    Hypertension    a. Treated x 10-15 yrs   Memory difficulty    OCD (obsessive compulsive disorder)    OSA on CPAP    Osteoarthritis    a. s/p R TKA 2003;  b. s/p L TKA 2007.   Vertigo     PAST SURGICAL HISTORY: Past Surgical History:  Procedure Laterality Date   BREAST EXCISIONAL BIOPSY Left 2005   CATARACT EXTRACTION Bilateral 2007   CYSTOSCOPY     "several"   ESOPHAGEAL MANOMETRY N/A 10/23/2015   Procedure: ESOPHAGEAL MANOMETRY (EM);  Surgeon: Willis ModenaWilliam Outlaw, MD;  Location: [facility];  Service: Endoscopy;  Laterality: N/A;   KIDNEY STONE SURGERY  1965   x1   Left Knee Replacement     b. 2007   NASAL SEPTUM SURGERY  1994   Right Knee Replacement     a. 2003   TOE SURGERY Left 2002   big toe   TOTAL KNEE REVISION Right 03/28/2014   Procedure: RIGHT KNEE POLYETHYLENE REVISION WITH FEMORAL BONE GRAFTING;  Surgeon: Loanne DrillingFrank Aluisio V, MD;  Location: [facility];  Service: Orthopedics;  Laterality: Right;    FAMILY HISTORY: Family History  Problem Relation Age of Onset   Heart attack Mother        died @ 8084   Hypertension Mother    Diabetes Father    Stroke Father        died @ 5580 - multiple strokes.   Other Sister        s/p heart valve replacement   Breast cancer Sister        late 5670's   Diabetes Brother        died @ 6251 with ESRD   Other Brother         alive/well @ 8784    SOCIAL HISTORY: Social History   Socioeconomic History   Marital status: Married    Spouse name: Dimas AguasHoward   Number of children: 2   Years of education: Not on file   Highest education level: Some college, no degree  Occupational History    Comment: retired Runner, broadcasting/film/videoteacher  assistant, [employer]  Tobacco Use   Smoking status: Former    Packs/day: 0.50    Years: 32.00    Pack years: 16.00    Types: Cigarettes    Quit date: 03/23/1992    Years since quitting: 28.6   Smokeless tobacco: Never  Substance and Sexual Activity   Alcohol use: Yes    Comment: Glass of wine occasionally   Drug use: No   Sexual activity: Not on file  Other Topics Concern   Not on file  Social History Narrative   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  Lives in [place] with husband.   Right handed    No caffeine    Social Determinants of Health   Financial Resource Strain: Not on file  Food Insecurity: Not on file  Transportation Needs: Not on file  Physical Activity: Not on file  Stress: Not on file  Social Connections: Not on file  Intimate Partner Violence: Not on file     PHYSICAL EXAM  GENERAL EXAM/CONSTITUTIONAL: Vitals:  Vitals:   11/05/20 1435  BP: 126/62  Pulse: 76  SpO2: 96%  Height: 5\' 7"  (1.702 m)   Body mass index is 31.95 kg/m. Wt Readings from Last 3 Encounters:  03/28/14 204 lb (92.5 kg)  03/20/14 204 lb (92.5 kg)  07/28/12 204 lb 12.8 oz (92.9 kg)   Patient is in no distress; well developed, nourished and groomed; neck is supple  CARDIOVASCULAR: Examination of carotid arteries is normal; no carotid bruits Regular rate and rhythm, no murmurs Examination of peripheral vascular system by observation and palpation is normal  EYES: Ophthalmoscopic exam of optic discs and posterior segments is normal; no papilledema or hemorrhages No results found.  MUSCULOSKELETAL: Gait, strength, tone, movements noted in Neurologic exam  below  NEUROLOGIC: MENTAL STATUS:  MMSE - Mini Mental State Exam 11/05/2020  Orientation to time 5  Orientation to Place 5  Registration 3  Attention/ Calculation 5  Recall 1  Language- name 2 objects 2  Language- repeat 1  Language- follow 3 step command 3  Language- read & follow direction 1  Write a sentence 1  Copy design 1  Total score 28   awake, alert, oriented to person, place and time recent and remote memory intact normal attention and concentration language fluent, comprehension intact, naming intact fund of knowledge appropriate  CRANIAL NERVE:  2nd - no papilledema on fundoscopic exam 2nd, 3rd, 4th, 6th - pupils equal and reactive to light, visual fields full to confrontation, extraocular muscles intact, no nystagmus 5th - facial sensation symmetric 7th - facial strength symmetric 8th - hearing intact 9th - palate elevates symmetrically, uvula midline 11th - shoulder shrug symmetric 12th - tongue protrusion midline  MOTOR:  normal bulk and tone, full strength in the BUE, BLE  SENSORY:  normal and symmetric to light touch, temperature, vibration  COORDINATION:  finger-nose-finger, fine finger movements normal  REFLEXES:  deep tendon reflexes TRACE and symmetric  GAIT/STATION:  narrow based gait; UNSTEADY TURNING     DIAGNOSTIC DATA (LABS, IMAGING, TESTING) - I reviewed patient records, labs, notes, testing and imaging myself where available.  Lab Results  Component Value Date   WBC 10.4 03/30/2014   HGB 13.0 03/30/2014   HCT 36.5 03/30/2014   MCV 94.8 03/30/2014   PLT 124 (L) 03/30/2014      Component Value Date/Time   NA 139 03/30/2014 0511   K 3.4 (L) 03/30/2014 0511   CL 99 03/30/2014 0511  CO2 31 03/30/2014 0511   GLUCOSE 172 (H) 03/30/2014 0511   BUN 17 03/30/2014 0511   CREATININE 0.71 03/30/2014 0511   CALCIUM 9.4 03/30/2014 0511   PROT 7.1 01/25/2012 0525   ALBUMIN 3.6 01/25/2012 0525   AST 40 (H) 01/25/2012 0525   ALT 42  (H) 01/25/2012 0525   ALKPHOS 76 01/25/2012 0525   BILITOT 0.7 01/25/2012 0525   GFRNONAA 85 (L) 03/30/2014 0511   GFRAA >90 03/30/2014 0511   Lab Results  Component Value Date   CHOL 157 01/25/2012   HDL 57 01/25/2012   LDLCALC 85 01/25/2012   TRIG 77 01/25/2012   CHOLHDL 2.8 01/25/2012   No results found for: HGBA1C No results found for: VITAMINB12 Lab Results  Component Value Date   TSH 2.921 01/24/2012    12/11/19 CT lumbar spine - Approximately 20% fracture of the superior endplate of the L2 vertebral body. Mild retropulsion of bone into the canal causing mild spinal stenosis.   Multilevel degenerative changes and stenosis as above.  07/31/20 MRI brain Moderate chronic microvascular ischemic changes of the white matter and parenchymal volume loss.    ASSESSMENT AND PLAN  80 y.o. year old female here with generalized symptoms of gait difficulty, dizziness and balance issue going back to 2018, likely related to multiple factors including spine and knee issues, age-related decline and deconditioning.  MRI brain has been unremarkable.  Has some lumbar spinal stenosis which has been evaluated managed by neurosurgery in the past.   Dx:  1. Gait difficulty   2. Memory loss     PLAN:  GAIT DIFFICULTY / DIZZINESS / BALANCE ISSUES (since ~2018; history of bilateral knee replacements; lumbar spinal stenosis) - recommend PT evaluation; use cane / walker - check B12  LUMBAR SPINAL STENOSIS / COMPRESSION FRACTURE - follow up with neurosurgery as needed  MEMORY LOSS - could be related to pain, depression, decline in mobility; MCI?  Orders Placed This Encounter  Procedures   Vitamin B12   Ambulatory referral to Physical Therapy   Return for return to PCP, pending if symptoms worsen or fail to improve, pending test results.  I spent 65 minutes of face-to-face and non-face-to-face time with patient.  This included previsit chart review, lab review, study review, order  entry, electronic health record documentation, patient education.     Suanne Marker, MD 11/05/2020, 3:37 PM Certified in Neurology, Neurophysiology and Neuroimaging  [facility] [address] [place], [place] [address] [phone]

## 2020-11-05 NOTE — Patient Instructions (Signed)
GAIT DIFFICULTY (since ~2018; history of bilateral knee replacements; lumbar spinal stenosis) - recommend PT evaluation; use cane / walker - check B12

## 2020-11-06 ENCOUNTER — Encounter: Payer: Self-pay | Admitting: Diagnostic Neuroimaging

## 2020-11-07 ENCOUNTER — Telehealth: Payer: Self-pay | Admitting: Diagnostic Neuroimaging

## 2020-11-07 NOTE — Telephone Encounter (Signed)
PT referral sent to [facility] in [place]. Phone: [phone].

## 2021-01-02 DIAGNOSIS — R634 Abnormal weight loss: Secondary | ICD-10-CM | POA: Diagnosis not present

## 2021-01-02 DIAGNOSIS — R42 Dizziness and giddiness: Secondary | ICD-10-CM | POA: Diagnosis not present

## 2021-01-02 DIAGNOSIS — R7309 Other abnormal glucose: Secondary | ICD-10-CM | POA: Diagnosis not present

## 2021-01-02 DIAGNOSIS — I951 Orthostatic hypotension: Secondary | ICD-10-CM | POA: Diagnosis not present

## 2021-01-02 DIAGNOSIS — H8111 Benign paroxysmal vertigo, right ear: Secondary | ICD-10-CM | POA: Diagnosis not present

## 2021-01-23 DIAGNOSIS — I10 Essential (primary) hypertension: Secondary | ICD-10-CM | POA: Diagnosis not present

## 2021-01-23 DIAGNOSIS — H8110 Benign paroxysmal vertigo, unspecified ear: Secondary | ICD-10-CM | POA: Diagnosis not present

## 2021-02-12 DIAGNOSIS — M545 Low back pain, unspecified: Secondary | ICD-10-CM | POA: Diagnosis not present

## 2021-02-12 DIAGNOSIS — R2681 Unsteadiness on feet: Secondary | ICD-10-CM | POA: Diagnosis not present

## 2021-02-12 DIAGNOSIS — R296 Repeated falls: Secondary | ICD-10-CM | POA: Diagnosis not present

## 2021-02-12 DIAGNOSIS — R42 Dizziness and giddiness: Secondary | ICD-10-CM | POA: Diagnosis not present

## 2021-02-17 DIAGNOSIS — M545 Low back pain, unspecified: Secondary | ICD-10-CM | POA: Diagnosis not present

## 2021-02-17 DIAGNOSIS — R296 Repeated falls: Secondary | ICD-10-CM | POA: Diagnosis not present

## 2021-02-17 DIAGNOSIS — R2681 Unsteadiness on feet: Secondary | ICD-10-CM | POA: Diagnosis not present

## 2021-02-17 DIAGNOSIS — R42 Dizziness and giddiness: Secondary | ICD-10-CM | POA: Diagnosis not present

## 2021-02-19 DIAGNOSIS — M545 Low back pain, unspecified: Secondary | ICD-10-CM | POA: Diagnosis not present

## 2021-02-19 DIAGNOSIS — R2681 Unsteadiness on feet: Secondary | ICD-10-CM | POA: Diagnosis not present

## 2021-02-19 DIAGNOSIS — R42 Dizziness and giddiness: Secondary | ICD-10-CM | POA: Diagnosis not present

## 2021-02-19 DIAGNOSIS — R296 Repeated falls: Secondary | ICD-10-CM | POA: Diagnosis not present

## 2021-02-24 DIAGNOSIS — M545 Low back pain, unspecified: Secondary | ICD-10-CM | POA: Diagnosis not present

## 2021-02-24 DIAGNOSIS — R42 Dizziness and giddiness: Secondary | ICD-10-CM | POA: Diagnosis not present

## 2021-02-24 DIAGNOSIS — R2681 Unsteadiness on feet: Secondary | ICD-10-CM | POA: Diagnosis not present

## 2021-02-24 DIAGNOSIS — R296 Repeated falls: Secondary | ICD-10-CM | POA: Diagnosis not present

## 2021-03-26 ENCOUNTER — Emergency Department (HOSPITAL_COMMUNITY): Payer: Medicare Other

## 2021-03-26 ENCOUNTER — Emergency Department (HOSPITAL_COMMUNITY)
Admission: EM | Admit: 2021-03-26 | Discharge: 2021-03-27 | Disposition: A | Discharge status: Home / Self Care | Payer: Medicare Other | Attending: Emergency Medicine | Admitting: Emergency Medicine

## 2021-03-26 ENCOUNTER — Encounter (HOSPITAL_COMMUNITY): Payer: Self-pay | Admitting: Emergency Medicine

## 2021-03-26 DIAGNOSIS — R42 Dizziness and giddiness: Secondary | ICD-10-CM | POA: Diagnosis not present

## 2021-03-26 DIAGNOSIS — R000 Tachycardia, unspecified: Secondary | ICD-10-CM | POA: Diagnosis not present

## 2021-03-26 DIAGNOSIS — E860 Dehydration: Secondary | ICD-10-CM | POA: Insufficient documentation

## 2021-03-26 DIAGNOSIS — R55 Syncope and collapse: Secondary | ICD-10-CM | POA: Insufficient documentation

## 2021-03-26 DIAGNOSIS — I10 Essential (primary) hypertension: Secondary | ICD-10-CM | POA: Diagnosis not present

## 2021-03-26 DIAGNOSIS — I959 Hypotension, unspecified: Secondary | ICD-10-CM | POA: Diagnosis not present

## 2021-03-26 DIAGNOSIS — I951 Orthostatic hypotension: Secondary | ICD-10-CM

## 2021-03-26 LAB — COMPREHENSIVE METABOLIC PANEL
ALT: 29 U/L (ref 0–44)
AST: 31 U/L (ref 15–41)
Albumin: 4.1 g/dL (ref 3.5–5.0)
Alkaline Phosphatase: 82 U/L (ref 38–126)
Anion gap: 9 (ref 5–15)
BUN: 15 mg/dL (ref 8–23)
CO2: 25 mmol/L (ref 22–32)
Calcium: 9.9 mg/dL (ref 8.9–10.3)
Chloride: 105 mmol/L (ref 98–111)
Creatinine, Ser: 0.79 mg/dL (ref 0.44–1.00)
GFR, Estimated: >60 mL/min (ref >60)
Glucose, Bld: 178 mg/dL — ABNORMAL HIGH (ref 70–99)
Potassium: 3.6 mmol/L (ref 3.5–5.1)
Sodium: 139 mmol/L (ref 135–145)
Total Bilirubin: 1.2 mg/dL (ref 0.3–1.2)
Total Protein: 7.3 g/dL (ref 6.5–8.1)

## 2021-03-26 LAB — CBC WITH DIFFERENTIAL/PLATELET
Abs Immature Granulocytes: 0.01 10*3/uL (ref 0.00–0.07)
Basophils Absolute: 0.1 10*3/uL (ref 0.0–0.1)
Basophils Relative: 1 %
Eosinophils Absolute: 0 10*3/uL (ref 0.0–0.5)
Eosinophils Relative: 0 %
HCT: 43.5 % (ref 36.0–46.0)
Hemoglobin: 15.4 g/dL — ABNORMAL HIGH (ref 12.0–15.0)
Immature Granulocytes: 0 %
Lymphocytes Relative: 18 %
Lymphs Abs: 1.1 10*3/uL (ref 0.7–4.0)
MCH: 33.1 pg (ref 26.0–34.0)
MCHC: 35.4 g/dL (ref 30.0–36.0)
MCV: 93.5 fL (ref 80.0–100.0)
Monocytes Absolute: 0.4 10*3/uL (ref 0.1–1.0)
Monocytes Relative: 7 %
Neutro Abs: 4.5 10*3/uL (ref 1.7–7.7)
Neutrophils Relative %: 74 %
Platelets: 123 10*3/uL — ABNORMAL LOW (ref 150–400)
RBC: 4.65 MIL/uL (ref 3.87–5.11)
RDW: 11.2 % — ABNORMAL LOW (ref 11.5–15.5)
WBC: 6.1 10*3/uL (ref 4.0–10.5)
nRBC: 0 % (ref 0.0–0.2)

## 2021-03-26 LAB — URINALYSIS, ROUTINE W REFLEX MICROSCOPIC
Bilirubin Urine: NEGATIVE
Glucose, UA: NEGATIVE mg/dL
Hgb urine dipstick: NEGATIVE
Ketones, ur: NEGATIVE mg/dL
Leukocytes,Ua: NEGATIVE
Nitrite: NEGATIVE
Protein, ur: NEGATIVE mg/dL
Specific Gravity, Urine: 1.009 (ref 1.005–1.030)
pH: 7 (ref 5.0–8.0)

## 2021-03-26 LAB — TROPONIN I (HIGH SENSITIVITY)
Troponin I (High Sensitivity): 8 ng/L (ref <18)
Troponin I (High Sensitivity): 8 ng/L (ref <18)

## 2021-03-26 NOTE — ED Triage Notes (Signed)
Patient BIB [facility] from home where she lives with her husband after multiple syncopal episodes. Patient passed out again with EMS when standing to get on EMS stretcher. Patient alert and in no apparent distress at this time.

## 2021-03-26 NOTE — ED Provider Triage Note (Signed)
Emergency Medicine Provider Triage Evaluation Note  Tiffany Burke , a 81 y.o. female  was evaluated in triage.  Pt complains of numerous syncopal episodes. Patient states she has been dealing with this for 2 years now. Per chart review, she has been seen for dizziness and vertigo. Patient states she feels dizziness and lightheaded prior to syncope episodes. No preceding chest pain or shortness of breath. She admits to decreased po intake due to decreased appetite.   Review of Systems  Positive: syncope Negative: CP  Physical Exam  BP (!) 179/79 (BP Location: Right Arm)    Pulse 76    Temp 99 F (37.2 C) (Oral)    Resp 18    SpO2 95%  Gen:   Awake, no distress   Resp:  Normal effort  MSK:   Moves extremities without difficulty  Other:    Medical Decision Making  Medically screening exam initiated at 12:41 PM.  Appropriate orders placed.  [name] was informed that the remainder of the evaluation will be completed by another provider, this initial triage assessment does not replace that evaluation, and the importance of remaining in the ED until their evaluation is complete.  Syncope Routine labs CT head EKG UA   Suzy Bouchard, PA-C 03/26/21 1245

## 2021-03-27 DIAGNOSIS — R55 Syncope and collapse: Secondary | ICD-10-CM | POA: Diagnosis not present

## 2021-03-27 DIAGNOSIS — E860 Dehydration: Secondary | ICD-10-CM | POA: Diagnosis not present

## 2021-03-27 MED ORDER — SODIUM CHLORIDE 0.9 % IV BOLUS
1000.0000 mL | Freq: Once | INTRAVENOUS | Status: AC
  Administered 2021-03-27: 1000 mL via INTRAVENOUS

## 2021-03-27 MED ORDER — ACETAMINOPHEN 500 MG PO TABS
1000.0000 mg | ORAL_TABLET | Freq: Once | ORAL | Status: AC
  Administered 2021-03-27: 1000 mg via ORAL
  Filled 2021-03-27: qty 2

## 2021-03-27 NOTE — ED Notes (Signed)
Pt let tech in triage know that she was having a headache. PA notified and additional orders received. No acute changes noted. Will continue to monitor.

## 2021-03-27 NOTE — Discharge Instructions (Addendum)
Wear compression stockings while awake.  This will help you to not feel as dizzy when you stand up.

## 2021-03-27 NOTE — ED Provider Notes (Signed)
[facility] EMERGENCY DEPARTMENT Provider Note   CSN: [id number] Arrival date & time: 03/26/21  1149     History  Chief Complaint  Patient presents with   Loss of Consciousness    Tiffany Burke is a 81 y.o. female.  Pt presents to the ED today with syncope.  Pt has had multiple syncopal events over the last 18 months.  She describes feeling dizzy with standing.  She has seen neurology for this problem and pt was recommended.  Pt has not seen cardiology.  Pt passed out multiple times yesterday.  She did not injure herself.  She is not on blood thinners and did not hit her head.  She has been waiting nearly 22 hours to be seen and has not had any syncopal events while here.  She denies any cp or any current dizziness.         Home Medications Prior to Admission medications   Medication Sig Start Date End Date Taking? Authorizing Provider  atorvastatin (LIPITOR) 20 MG tablet Take 20 mg by mouth at bedtime.   Yes [provider]  citalopram (CELEXA) 40 MG tablet Take 40 mg by mouth every morning.    Yes [provider]  Cyanocobalamin (VITAMIN B 12 PO) Take 1,000 mcg by mouth daily.   Yes [provider]  diphenhydrAMINE (BENADRYL) 25 MG tablet Take 25 mg by mouth daily as needed for allergies (in the am).   Yes [provider]  ibuprofen (ADVIL) 200 MG tablet Take 200 mg by mouth every 6 (six) hours as needed for headache or moderate pain.   Yes [provider]  losartan (COZAAR) 50 MG tablet Take 50 mg by mouth daily. 01/06/21  Yes [provider]  Multiple Vitamins-Minerals (PRESERVISION AREDS 2 PO) Take 1 tablet by mouth in the morning and at bedtime.   Yes [provider]  nitroGLYCERIN (NITROSTAT) 0.4 MG SL tablet Place 0.4 mg under the tongue every 5 (five) minutes as needed for chest pain. 08/02/13  Yes [provider]      Allergies    Penicillins and Vancomycin    Review  of Systems   Review of Systems  Neurological:  Positive for syncope.  All other systems reviewed and are negative.  Physical Exam Updated Vital Signs BP (!) 201/66 (BP Location: Right Arm)    Pulse 80    Temp 97.7 F (36.5 C) (Oral)    Resp 18    SpO2 98%  Physical Exam Vitals and nursing note reviewed.  Constitutional:      Appearance: Normal appearance.  HENT:     Head: Normocephalic and atraumatic.     Right Ear: External ear normal.     Left Ear: External ear normal.     Nose: Nose normal.     Mouth/Throat:     Mouth: Mucous membranes are moist.     Pharynx: Oropharynx is clear.  Eyes:     Extraocular Movements: Extraocular movements intact.     Conjunctiva/sclera: Conjunctivae normal.     Pupils: Pupils are equal, round, and reactive to light.  Cardiovascular:     Rate and Rhythm: Normal rate and regular rhythm.     Pulses: Normal pulses.     Heart sounds: Normal heart sounds.  Pulmonary:     Effort: Pulmonary effort is normal.     Breath sounds: Normal breath sounds.  Abdominal:     General: Abdomen is flat. Bowel sounds are normal.  Palpations: Abdomen is soft.  Musculoskeletal:        General: Normal range of motion.     Cervical back: Normal range of motion and neck supple.  Skin:    General: Skin is warm.     Capillary Refill: Capillary refill takes less than 2 seconds.  Neurological:     General: No focal deficit present.     Mental Status: She is alert and oriented to person, place, and time. Mental status is at baseline.  Psychiatric:        Mood and Affect: Mood normal.        Behavior: Behavior normal.    ED Results / Procedures / Treatments   Labs (all labs ordered are listed, but only abnormal results are displayed) Labs Reviewed  CBC WITH DIFFERENTIAL/PLATELET - Abnormal; Notable for the following components:      Result Value   Hemoglobin 15.4 (*)    RDW 11.2 (*)    Platelets 123 (*)    All other components within normal limits   COMPREHENSIVE METABOLIC PANEL - Abnormal; Notable for the following components:   Glucose, Bld 178 (*)    All other components within normal limits  URINALYSIS, ROUTINE W REFLEX MICROSCOPIC  TROPONIN I (HIGH SENSITIVITY)  TROPONIN I (HIGH SENSITIVITY)  TROPONIN I (HIGH SENSITIVITY)  TROPONIN I (HIGH SENSITIVITY)    EKG EKG Interpretation  Date/Time:  Wednesday March 26 2021 12:06:50 EST Ventricular Rate:  78 PR Interval:  162 QRS Duration: 128 QT Interval:  448 QTC Calculation: 510 R Axis:   -76 Text Interpretation: Normal sinus rhythm Right bundle branch block Left anterior fascicular block Abnormal ECG When compared with ECG of 20-Mar-2014 11:52, PREVIOUS ECG IS PRESENT No significant change since last tracing Reconfirmed by Jacalyn Lefevre [phone]) on 03/27/2021 11:47:28 AM  Radiology DG Chest 1 View  Result Date: 03/26/2021 CLINICAL DATA:  syncope EXAM: CHEST  1 VIEW COMPARISON:  January 24, 2012. FINDINGS: No consolidation. No visible pleural effusions or pneumothorax. Cardiomediastinal silhouette is within normal limits. Calcific atherosclerosis of the aorta. Polyarticular degenerative change. Mild S-shaped thoracic curvature IMPRESSION: No evidence of acute cardiopulmonary disease. Electronically Signed   By: Feliberto Harts M.D.   On: 03/26/2021 13:13   CT Head Wo Contrast  Result Date: 03/26/2021 CLINICAL DATA:  Dizziness. EXAM: CT HEAD WITHOUT CONTRAST TECHNIQUE: Contiguous axial images were obtained from the base of the skull through the vertex without intravenous contrast. COMPARISON:  None. FINDINGS: Brain: Mild chronic ischemic white matter disease is noted. No mass effect or midline shift is noted. Ventricular size is within normal limits. There is no evidence of mass lesion, hemorrhage or acute infarction. Vascular: No hyperdense vessel or unexpected calcification. Skull: Normal. Negative for fracture or focal lesion. Sinuses/Orbits: No acute finding. Other: None.  IMPRESSION: No acute intracranial abnormality seen. Electronically Signed   By: Lupita Raider M.D.   On: 03/26/2021 13:50    Procedures Procedures    Medications Ordered in ED Medications  acetaminophen (TYLENOL) tablet 1,000 mg (1,000 mg Oral Given 03/27/21 0303)  sodium chloride 0.9 % bolus 1,000 mL (1,000 mLs Intravenous New Bag/Given 03/27/21 1026)    ED Course/ Medical Decision Making/ A&P                           Medical Decision Making  Pt describes sx of orthostatic syncope.  Pt's labs and films were reviewed by me.  They do not show anything  acute.  Pt will be given 1L NS.  Plan reviewed with pt and her husband.  She is given a referral to cards as she may need a zio patch due to the multiple syncopal events. She is to wear compression hose.  She is to drink plenty of water.  Return if worse.   Final Clinical Impression(s) / ED Diagnoses Final diagnoses:  Orthostatic syncope  Dehydration    Rx / DC Orders ED Discharge Orders          Ordered    Ambulatory referral to Cardiology        03/27/21 1145              Jacalyn LefevreHaviland, Shya Kovatch, MD 03/27/21 1148

## 2021-04-08 DIAGNOSIS — R4189 Other symptoms and signs involving cognitive functions and awareness: Secondary | ICD-10-CM | POA: Diagnosis not present

## 2021-04-08 DIAGNOSIS — R2681 Unsteadiness on feet: Secondary | ICD-10-CM | POA: Diagnosis not present

## 2021-04-08 DIAGNOSIS — R296 Repeated falls: Secondary | ICD-10-CM | POA: Diagnosis not present

## 2021-04-10 ENCOUNTER — Other Ambulatory Visit: Payer: Self-pay | Admitting: Internal Medicine

## 2021-04-10 ENCOUNTER — Ambulatory Visit: Payer: Medicare Other | Admitting: Internal Medicine

## 2021-04-10 ENCOUNTER — Other Ambulatory Visit: Payer: Self-pay

## 2021-04-10 ENCOUNTER — Ambulatory Visit (INDEPENDENT_AMBULATORY_CARE_PROVIDER_SITE_OTHER): Payer: Medicare Other

## 2021-04-10 ENCOUNTER — Encounter: Payer: Self-pay | Admitting: Internal Medicine

## 2021-04-10 VITALS — BP 134/80 | HR 75 | Ht 67.0 in | Wt 161.8 lb

## 2021-04-10 DIAGNOSIS — R55 Syncope and collapse: Secondary | ICD-10-CM

## 2021-04-10 DIAGNOSIS — R296 Repeated falls: Secondary | ICD-10-CM | POA: Diagnosis not present

## 2021-04-10 DIAGNOSIS — I452 Bifascicular block: Secondary | ICD-10-CM

## 2021-04-10 NOTE — Progress Notes (Unsigned)
Enrolled for [employer] to mail a ZIO XT long term holter monitor to the patients address on file.  

## 2021-04-10 NOTE — Progress Notes (Signed)
Cardiology Office Note:    Date:  04/10/2021   ID:  Tiffany Burke, Tiffany Burke 03/02/1941, MRN [id number]  PCP:  Carol Ada, MD   [place] [facility] Cardiologist:  Lenna Sciara, MD Referring MD: Isla Pence, MD   Chief Complaint/Reason for Referral: Syncope 1  ASSESSMENT:    Falls frequently    PLAN:    In order of problems listed above:  1.  Unclear etiology.  My suspicion is this does not represent a cardiac etiology.  However given her bifascicular block I will obtain a 1 week monitor and an echocardiogram.  We will keep follow-up with me open-ended so that the patient and their family could concentrate on other areas of investigation.    Dispo:  No follow-ups on file.     Medication Adjustments/Labs and Tests Ordered: Current medicines are reviewed at length with the patient today.  Concerns regarding medicines are outlined above.   Tests Ordered: No orders of the defined types were placed in this encounter.   Medication Changes: No orders of the defined types were placed in this encounter.   History of Present Illness:     The patient is a 81 y.o. female with the indicated medical history here for here for cardiac evaluation regarding frequent falls.  The patient is here with her husband and son.  Apparently the patient has had increasing frequency of falls.  She tells me she gets lightheaded and falls but occasionally her husband tells me it seems like she is clumsy with her feet at times.  She saw neurology and no specific recommendations were made.  She was seen in the emergency department a few weeks ago with the same complaint.  She did fall but did not hit her head.  Her EKG at that time demonstrated sinus rhythm with a bifascicular block.  Her son tells me that a few weeks ago he she had had 1 of these episodes and he checked her pulse and it was not slow and was around 70-80.  There is some suspicion that this may represent a neurocognitive  decline and they will be seeing a specialist about this soon.        Previous Medical History: Past Medical History:  Diagnosis Date   Anginal pain ([place])    "went to ED-complete work up and everything was fine"   Anxiety    Borderline diabetes    Carotid artery disease ([place])    a. 09/2011 Carotid U/S: Eccentric plaque bilat carotid bulbs & origin of bilat ICA's->50-69% on right with borderline elvation of velocities on left.  Degree of narrowing appeared more severe than acquired by velocities.   Carpal tunnel syndrome    Chest pain    a. admx with CP 11/13 => Lexiscan MV 01/25/12:  No ischemia, EF 72%.     Colon polyps    Depression    Diabetes mellitus without complication ([place])    Ejection Fraction    a. Echo 01/25/12: mild LVH, EF 55-60%, Gr 1 diast dysfn.    Frequent falls    GERD (gastroesophageal reflux disease)    History of kidney stones    none since age 4   Hypercholesteremia    a. Treated x 5 yrs    Hypertension    a. Treated x 10-15 yrs   Memory difficulty    OCD (obsessive compulsive disorder)    OSA on CPAP    Osteoarthritis    a. s/p R TKA 2003;  b. s/p L  TKA 2007.   Vertigo      Current Medications: Current Meds  Medication Sig   atorvastatin (LIPITOR) 20 MG tablet Take 20 mg by mouth at bedtime.   citalopram (CELEXA) 40 MG tablet Take 40 mg by mouth every morning.    Cyanocobalamin (VITAMIN B 12 PO) Take 1,000 mcg by mouth daily.   diphenhydrAMINE (BENADRYL) 25 MG tablet Take 25 mg by mouth daily as needed for allergies (in the am).   ibuprofen (ADVIL) 200 MG tablet Take 200 mg by mouth every 6 (six) hours as needed for headache or moderate pain.   losartan (COZAAR) 50 MG tablet Take 25 mg by mouth daily.   Multiple Vitamins-Minerals (PRESERVISION AREDS 2 PO) Take 1 tablet by mouth in the morning and at bedtime.   nitroGLYCERIN (NITROSTAT) 0.4 MG SL tablet Place 0.4 mg under the tongue every 5 (five) minutes as needed for chest pain.     Allergies:     Penicillins and Vancomycin   Social History:   Social History   Tobacco Use   Smoking status: Former    Packs/day: 0.50    Years: 32.00    Pack years: 16.00    Types: Cigarettes    Quit date: 03/23/1992    Years since quitting: 29.0   Smokeless tobacco: Never  Substance Use Topics   Alcohol use: Yes    Comment: Glass of wine occasionally   Drug use: No     Family Hx: Family History  Problem Relation Age of Onset   Heart attack Mother        died @ 41   Hypertension Mother    Diabetes Father    Stroke Father        died @ 80 - multiple strokes.   Other Sister        s/p heart valve replacement   Breast cancer Sister        late 75's   Diabetes Brother        died @ 76 with ESRD   Other Brother        alive/well @ 25     Review of Systems:   Please see the history of present illness.    All other systems reviewed and are negative.     EKGs/Labs/Other Test Reviewed:    EKG: Sinus rhythm with bifascicular block  Prior CV studies:  Lexiscan 2013 Normal Lexiscan Myoview with no  electrocardiographic changes.  The scintigraphic results show  probable soft tissue attenuation but there is no ischemia on this  study.  The gated ejection fraction was 72% and the wall motion was  normal.   TTE 2013 Left ventricle: The cavity size was normal. Wall thickness  was increased in a pattern of mild LVH. Systolic function  was normal. The estimated ejection fraction was in the range  of 55% to 60%. Wall motion was normal; there were no  regional wall motion abnormalities. Doppler parameters are  consistent with abnormal left ventricular relaxation (grade  1 diastolic dysfunction).         Imaging studies that I have independently reviewed today: None available  Recent Labs: 03/26/2021: ALT 29; BUN 15; Creatinine, Ser 0.79; Hemoglobin 15.4; Platelets 123; Potassium 3.6; Sodium 139   Recent Lipid Panel Lab Results  Component Value Date/Time   CHOL 157 01/25/2012  05:25 AM   TRIG 77 01/25/2012 05:25 AM   HDL 57 01/25/2012 05:25 AM   LDLCALC 85 01/25/2012 05:25 AM    Risk  Assessment/Calculations:          Physical Exam:    VS:  BP 134/80    Pulse 75    Ht 5\' 7"  (1.702 m)    Wt 161 lb 12.8 oz (73.4 kg)    SpO2 98%    BMI 25.34 kg/m    Wt Readings from Last 3 Encounters:  04/10/21 161 lb 12.8 oz (73.4 kg)  03/28/14 204 lb (92.5 kg)  03/20/14 204 lb (92.5 kg)    GENERAL:  No apparent distress, Aox3, frail HEENT:  No carotid bruits, +2 carotid impulses, no scleral icterus CAR: RRR no murmurs, gallops, rubs, or thrills RES:  Clear to auscultation bilaterally ABD:  Soft, nontender, nondistended, positive bowel sounds x 4 VASC:  +2 radial pulses, +2 carotid pulses, palpable pedal pulses NEURO:  CN 2-12 grossly intact; motor and sensory grossly intact PSYCH:  No active depression or anxiety EXT:  No edema, ecchymosis, or cyanosis  Signed, Early Osmond, MD  04/10/2021 3:21 PM    [address], [place], [place]  [address] Phone: [phone]; Fax: [phone]   Note:  This document was prepared using Dragon voice recognition software and may include unintentional dictation errors.

## 2021-04-10 NOTE — Patient Instructions (Signed)
Medication Instructions:  Your physician recommends that you continue on your current medications as directed. Please refer to the Current Medication list given to you today.   *If you need a refill on your cardiac medications before your next appointment, please call your pharmacy*   Lab Work: None ordered   If you have labs (blood work) drawn today and your tests are completely normal, you will receive your results only by: MyChart Message (if you have MyChart) OR A paper copy in the mail If you have any lab test that is abnormal or we need to change your treatment, we will call you to review the results.   Testing/Procedures: A zio monitor was ordered today. It will remain on for 7 days. You will then return monitor and event diary in provided box. It takes 1-2 weeks for report to be downloaded and returned to [place]. We will call you with the results. If monitor falls off or has orange flashing light, please call Zio for further instructions.   Your physician has requested that you have an echocardiogram. Echocardiography is a painless test that uses sound waves to create images of your heart. It provides your doctor with information about the size and shape of your heart and how well your hearts chambers and valves are working. This procedure takes approximately one hour. There are no restrictions for this procedure.     Follow-Up: Follow up as needed   Other Instructions ZIO XT- Long Term Monitor Instructions  Your physician has requested you wear a ZIO patch monitor for 7 days.  This is a single patch monitor. [employer] supplies one patch monitor per enrollment. Additional stickers are not available. Please do not apply patch if you will be having a Nuclear Stress Test,  Echocardiogram, Cardiac CT, MRI, or Chest Xray during the period you would be wearing the  monitor. The patch cannot be worn during these tests. You cannot remove and re-apply the  ZIO XT patch monitor.  Your  ZIO patch monitor will be mailed 3 day USPS to your address on file. It may take 3-5 days  to receive your monitor after you have been enrolled.  Once you have received your monitor, please review the enclosed instructions. Your monitor  has already been registered assigning a specific monitor serial # to you.  Billing and Patient Assistance Program Information  We have supplied [employer] with any of your insurance information on file for billing purposes. [employer] offers a sliding scale Patient Assistance Program for patients that do not have  insurance, or whose insurance does not completely cover the cost of the ZIO monitor.  You must apply for the Patient Assistance Program to qualify for this discounted rate.  To apply, please call [employer] at [phone], select option 4, select option 2, ask to apply for  Patient Assistance Program. Meredeth Ide will ask your household income, and how many people  are in your household. They will quote your out-of-pocket cost based on that information.  [employer] will also be able to set up a 51-month, interest-free payment plan if needed.  Applying the monitor   Shave hair from upper left chest.  Hold abrader disc by orange tab. Rub abrader in 40 strokes over the upper left chest as  indicated in your monitor instructions.  Clean area with 4 enclosed alcohol pads. Let dry.  Apply patch as indicated in monitor instructions. Patch will be placed under collarbone on left  side of chest with arrow pointing upward.  Rub  patch adhesive wings for 2 minutes. Remove white label marked "1". Remove the white  label marked "2". Rub patch adhesive wings for 2 additional minutes.  While looking in a mirror, press and release button in center of patch. A small green light will  flash 3-4 times. This will be your only indicator that the monitor has been turned on.  Do not shower for the first 24 hours. You may shower after the first 24 hours.  Press the button if you feel  a symptom. You will hear a small click. Record Date, Time and  Symptom in the Patient Logbook.  When you are ready to remove the patch, follow instructions on the last 2 pages of Patient  Logbook. Stick patch monitor onto the last page of Patient Logbook.  Place Patient Logbook in the blue and white box. Use locking tab on box and tape box closed  securely. The blue and white box has prepaid postage on it. Please place it in the mailbox as  soon as possible. Your physician should have your test results approximately 7 days after the  monitor has been mailed back to [facility].  Call [facility] Customer Care at [phone] if you have questions regarding  your ZIO XT patch monitor. Call them immediately if you see an orange light blinking on your  monitor.  If your monitor falls off in less than 4 days, contact our Monitor department at [phone].  If your monitor becomes loose or falls off after 4 days call [employer] at [phone] for  suggestions on securing your monitor

## 2021-04-13 DIAGNOSIS — R55 Syncope and collapse: Secondary | ICD-10-CM

## 2021-04-13 DIAGNOSIS — R296 Repeated falls: Secondary | ICD-10-CM

## 2021-04-13 DIAGNOSIS — I452 Bifascicular block: Secondary | ICD-10-CM | POA: Diagnosis not present

## 2021-04-15 ENCOUNTER — Ambulatory Visit: Payer: Medicare Other | Admitting: Internal Medicine

## 2021-04-28 DIAGNOSIS — R296 Repeated falls: Secondary | ICD-10-CM | POA: Diagnosis not present

## 2021-04-28 DIAGNOSIS — I452 Bifascicular block: Secondary | ICD-10-CM | POA: Diagnosis not present

## 2021-04-28 DIAGNOSIS — R55 Syncope and collapse: Secondary | ICD-10-CM | POA: Diagnosis not present

## 2021-04-29 ENCOUNTER — Other Ambulatory Visit: Payer: Self-pay

## 2021-04-29 ENCOUNTER — Ambulatory Visit (HOSPITAL_COMMUNITY): Payer: Medicare Other | Attending: Internal Medicine

## 2021-04-29 DIAGNOSIS — I779 Disorder of arteries and arterioles, unspecified: Secondary | ICD-10-CM | POA: Insufficient documentation

## 2021-04-29 DIAGNOSIS — Z8249 Family history of ischemic heart disease and other diseases of the circulatory system: Secondary | ICD-10-CM | POA: Diagnosis not present

## 2021-04-29 DIAGNOSIS — R42 Dizziness and giddiness: Secondary | ICD-10-CM | POA: Diagnosis not present

## 2021-04-29 DIAGNOSIS — R296 Repeated falls: Secondary | ICD-10-CM | POA: Diagnosis not present

## 2021-04-29 DIAGNOSIS — E119 Type 2 diabetes mellitus without complications: Secondary | ICD-10-CM | POA: Insufficient documentation

## 2021-04-29 DIAGNOSIS — E785 Hyperlipidemia, unspecified: Secondary | ICD-10-CM | POA: Insufficient documentation

## 2021-04-29 DIAGNOSIS — R002 Palpitations: Secondary | ICD-10-CM | POA: Diagnosis not present

## 2021-04-29 DIAGNOSIS — Z87891 Personal history of nicotine dependence: Secondary | ICD-10-CM | POA: Diagnosis not present

## 2021-04-29 DIAGNOSIS — G4730 Sleep apnea, unspecified: Secondary | ICD-10-CM | POA: Insufficient documentation

## 2021-04-29 DIAGNOSIS — I10 Essential (primary) hypertension: Secondary | ICD-10-CM

## 2021-04-29 DIAGNOSIS — I119 Hypertensive heart disease without heart failure: Secondary | ICD-10-CM | POA: Diagnosis not present

## 2021-04-29 LAB — ECHOCARDIOGRAM COMPLETE
Area-P 1/2: 2.89 cm2
S' Lateral: 2.5 cm

## 2021-05-13 ENCOUNTER — Institutional Professional Consult (permissible substitution): Payer: Medicare Other | Admitting: Diagnostic Neuroimaging

## 2021-05-13 ENCOUNTER — Encounter: Payer: Self-pay | Admitting: Diagnostic Neuroimaging

## 2021-05-28 ENCOUNTER — Ambulatory Visit: Payer: Medicare Other | Admitting: Diagnostic Neuroimaging

## 2021-05-28 ENCOUNTER — Encounter: Payer: Self-pay | Admitting: *Deleted

## 2021-05-28 VITALS — BP 145/91 | HR 87 | Ht 62.0 in | Wt 158.0 lb

## 2021-05-28 DIAGNOSIS — R413 Other amnesia: Secondary | ICD-10-CM | POA: Diagnosis not present

## 2021-05-28 DIAGNOSIS — F03B0 Unspecified dementia, moderate, without behavioral disturbance, psychotic disturbance, mood disturbance, and anxiety: Secondary | ICD-10-CM

## 2021-05-28 MED ORDER — MEMANTINE HCL 10 MG PO TABS: 10.0000 mg | ORAL_TABLET | Freq: Two times a day (BID) | ORAL | 12 refills | Status: DC

## 2021-05-28 NOTE — Patient Instructions (Signed)
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

## 2021-05-28 NOTE — Progress Notes (Signed)
[facility]  PATIENT: Tiffany Burke DOB: 1941-03-03  REFERRING CLINICIAN: Merri Burke, [name], MD HISTORY FROM: patient and husband REASON FOR VISIT: follow up   HISTORICAL  CHIEF COMPLAINT:  Chief Complaint  Patient presents with   MCI    Rm 6 , husbandDimas Aguas- Burke  MMSE 15    HISTORY OF PRESENT ILLNESS:   UPDATE (05/28/21, VRP): Since last visit, more significant decline in memory and depression since Nov 2022. More decline in ADLs; not able to manage meds, housekeeping, shopping, driving, cooking or finances. Here with husband.   PRIOR HPI (11/07/20): 81 year old female here for evaluation of gait and balance difficulty.  Patient is having intermittent episodes of lightheadedness, dizziness, falling backwards.  Symptoms have been present since at least 2018 and worsening over time.  She has history of low back pain, bilateral knee replacements, general physical decline.  She has had increasing falls recently with bruises and injuries.  She went to the emergency room for evaluation and has had lumbar spine compression fracture diagnosed which was managed conservatively.  Patient also having some increasing mild memory loss and forgetfulness.  No major changes in ADLs due to memory issues.  ADLs have slightly changed due to her physical limitations.   REVIEW OF SYSTEMS: Full 14 system review of systems performed and negative with exception of: As per HPI.  ALLERGIES: Allergies  Allergen Reactions   Penicillins Anaphylaxis   Vancomycin Rash    HOME MEDICATIONS: Outpatient Medications Prior to Visit  Medication Sig Dispense Refill   atorvastatin (LIPITOR) 20 MG tablet Take 20 mg by mouth at bedtime.     Cholecalciferol (D3-1000 PO) Take 1,000 Units by mouth daily.     losartan (COZAAR) 50 MG tablet Take 25 mg by mouth daily.     Multiple Vitamins-Minerals (PRESERVISION AREDS 2 PO) Take 1 tablet by mouth in the morning and at bedtime.     citalopram  (CELEXA) 40 MG tablet Take 40 mg by mouth every morning.  (Patient not taking: Reported on 05/28/2021)     nitroGLYCERIN (NITROSTAT) 0.4 MG SL tablet Place 0.4 mg under the tongue every 5 (five) minutes as needed for chest pain. (Patient not taking: Reported on 05/28/2021)     Cyanocobalamin (VITAMIN B 12 PO) Take 1,000 mcg by mouth daily.     diphenhydrAMINE (BENADRYL) 25 MG tablet Take 25 mg by mouth daily as needed for allergies (in the am).     ibuprofen (ADVIL) 200 MG tablet Take 200 mg by mouth every 6 (six) hours as needed for headache or moderate pain.     No facility-administered medications prior to visit.    PAST MEDICAL HISTORY: Past Medical History:  Diagnosis Date   Anginal pain (HCC)    "went to ED-complete work up and everything was fine"   Anxiety    Borderline diabetes    Carotid artery disease (HCC)    a. 09/2011 Carotid U/S: Eccentric plaque bilat carotid bulbs & origin of bilat ICA's->50-69% on right with borderline elvation of velocities on left.  Degree of narrowing appeared more severe than acquired by velocities.   Carpal tunnel syndrome    Chest pain    a. admx with CP 11/13 => Lexiscan MV 01/25/12:  No ischemia, EF 72%.     Colon polyps    Depression    Diabetes mellitus without complication (HCC)    Ejection Fraction    a. Echo 01/25/12: mild LVH, EF 55-60%, Gr 1 diast dysfn.  Frequent falls    GERD (gastroesophageal reflux disease)    History of kidney stones    none since age 24   Hypercholesteremia    a. Treated x 5 yrs    Hypertension    a. Treated x 10-15 yrs   Memory difficulty    OCD (obsessive compulsive disorder)    OSA on CPAP    Osteoarthritis    a. s/p R TKA 2003;  b. s/p L TKA 2007.   Vertigo     PAST SURGICAL HISTORY: Past Surgical History:  Procedure Laterality Date   BREAST EXCISIONAL BIOPSY Left 2005   CATARACT EXTRACTION Bilateral 2007   CYSTOSCOPY     "several"   ESOPHAGEAL MANOMETRY N/A 10/23/2015   Procedure: ESOPHAGEAL  MANOMETRY (EM);  Surgeon: Willis Modena, MD;  Location: [facility];  Service: Endoscopy;  Laterality: N/A;   KIDNEY STONE SURGERY  1965   x1   Left Knee Replacement     b. 2007   NASAL SEPTUM SURGERY  1994   Right Knee Replacement     a. 2003   TOE SURGERY Left 2002   big toe   TOTAL KNEE REVISION Right 03/28/2014   Procedure: RIGHT KNEE POLYETHYLENE REVISION WITH FEMORAL BONE GRAFTING;  Surgeon: Loanne Drilling, MD;  Location: [facility];  Service: Orthopedics;  Laterality: Right;    FAMILY HISTORY: Family History  Problem Relation Age of Onset   Heart attack Mother        died @ 29   Hypertension Mother    Diabetes Father    Stroke Father        died @ 63 - multiple strokes.   Other Sister        s/p heart valve replacement   Breast cancer Sister        late 91's   Diabetes Brother        died @ 62 with ESRD   Other Brother        alive/well @ 35    SOCIAL HISTORY: Social History   Socioeconomic History   Marital status: Married    Spouse name: Tiffany Aguas   Number of children: 2   Years of education: Not on file   Highest education level: Some college, no degree  Occupational History    Comment: retired Geologist, engineering, [place] [employer]  Tobacco Use   Smoking status: Former    Packs/day: 0.50    Years: 32.00    Pack years: 16.00    Types: Cigarettes    Quit date: 03/23/1992    Years since quitting: 29.2   Smokeless tobacco: Never  Substance and Sexual Activity   Alcohol use: Not Currently    Comment: none   Drug use: No   Sexual activity: Not on file  Other Topics Concern   Not on file  Social History Narrative   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     Lives in [place] with husband.   Right handed    No caffeine    Social Determinants of Health   Financial Resource Strain: Not on file  Food Insecurity: Not on file  Transportation Needs: Not on file  Physical Activity: Not on file  Stress: Not on file  Social Connections:  Not on file  Intimate Partner Violence: Not on file     PHYSICAL EXAM  GENERAL EXAM/CONSTITUTIONAL: Vitals:  Vitals:   05/28/21 1437  BP: (!) 145/91  Pulse: 87  Weight: 158 lb (71.7 kg)  Height:  5\' 2"  (1.575 m)   Body mass index is 28.9 kg/m. Wt Readings from Last 3 Encounters:  05/28/21 158 lb (71.7 kg)  04/10/21 161 lb 12.8 oz (73.4 kg)  03/28/14 204 lb (92.5 kg)   Patient is in no distress; well developed, nourished and groomed; neck is supple  CARDIOVASCULAR: Examination of carotid arteries is normal; no carotid bruits Regular rate and rhythm, no murmurs Examination of peripheral vascular system by observation and palpation is normal  EYES: Ophthalmoscopic exam of optic discs and posterior segments is normal; no papilledema or hemorrhages No results found.  MUSCULOSKELETAL: Gait, strength, tone, movements noted in Neurologic exam below  NEUROLOGIC: MENTAL STATUS:  MMSE - Mini Mental State Exam 05/28/2021 11/05/2020  Orientation to time 2 5  Orientation to Place 2 5  Registration 2 3  Attention/ Calculation 0 5  Recall 2 1  Language- name 2 objects 2 2  Language- repeat 0 1  Language- follow 3 step command 3 3  Language- read & follow direction 1 1  Write a sentence 1 1  Copy design 0 1  Total score 15 28   awake, alert, oriented to person, place and time recent and remote memory intact normal attention and concentration language fluent, comprehension intact, naming intact fund of knowledge appropriate  CRANIAL NERVE:  2nd - no papilledema on fundoscopic exam 2nd, 3rd, 4th, 6th - pupils equal and reactive to light, visual fields full to confrontation, extraocular muscles intact, no nystagmus 5th - facial sensation symmetric 7th - facial strength symmetric 8th - hearing intact 9th - palate elevates symmetrically, uvula midline 11th - shoulder shrug symmetric 12th - tongue protrusion midline  MOTOR:  normal bulk and tone, full strength in the BUE,  BLE  SENSORY:  normal and symmetric to light touch, temperature, vibration  COORDINATION:  finger-nose-finger, fine finger movements normal  REFLEXES:  deep tendon reflexes TRACE and symmetric  GAIT/STATION:  narrow based gait; UNSTEADY TURNING     DIAGNOSTIC DATA (LABS, IMAGING, TESTING) - I reviewed patient records, labs, notes, testing and imaging myself where available.  Lab Results  Component Value Date   WBC 6.1 03/26/2021   HGB 15.4 (H) 03/26/2021   HCT 43.5 03/26/2021   MCV 93.5 03/26/2021   PLT 123 (L) 03/26/2021      Component Value Date/Time   NA 139 03/26/2021 1240   K 3.6 03/26/2021 1240   CL 105 03/26/2021 1240   CO2 25 03/26/2021 1240   GLUCOSE 178 (H) 03/26/2021 1240   BUN 15 03/26/2021 1240   CREATININE 0.79 03/26/2021 1240   CALCIUM 9.9 03/26/2021 1240   PROT 7.3 03/26/2021 1240   ALBUMIN 4.1 03/26/2021 1240   AST 31 03/26/2021 1240   ALT 29 03/26/2021 1240   ALKPHOS 82 03/26/2021 1240   BILITOT 1.2 03/26/2021 1240   GFRNONAA >60 03/26/2021 1240   GFRAA >90 03/30/2014 0511   Lab Results  Component Value Date   CHOL 157 01/25/2012   HDL 57 01/25/2012   LDLCALC 85 01/25/2012   TRIG 77 01/25/2012   CHOLHDL 2.8 01/25/2012   No results found for: HGBA1C No results found for: VITAMINB12 Lab Results  Component Value Date   TSH 2.921 01/24/2012    12/11/19 CT lumbar spine - Approximately 20% fracture of the superior endplate of the L2 vertebral body. Mild retropulsion of bone into the canal causing mild spinal stenosis.   Multilevel degenerative changes and stenosis as above.  07/31/20 MRI brain [I reviewed images myself  and agree with interpretation. Moderate perisylvian and mesial temporal atrophy. -VRP]  - Moderate chronic microvascular ischemic changes of the white matter and parenchymal volume loss.  03/26/21 CT head  - No acute intracranial abnormality seen.    ASSESSMENT AND PLAN  81 y.o. year old female here with  generalized symptoms of gait difficulty, dizziness and balance issue going back to 2018, likely related to multiple factors including spine and knee issues, age-related decline and deconditioning.  MRI brain has been unremarkable.  Has some lumbar spinal stenosis which has been evaluated managed by neurosurgery in the past.   Dx:  1. Moderate dementia without behavioral disturbance, psychotic disturbance, mood disturbance, or anxiety, unspecified dementia type      PLAN:  MEMORY LOSS (MMSE 15/30; decline in ADLs; especially since Nov 2022; also with worsening depression, pain; concern for moderate neurodegenerative dementia) - start memantine 10mg  at bedtime; increase to twice a day after 1-2 weeks - safety / supervision issues reviewed - daily physical activity / exercise (at least 15-30 minutes) - eat more plants / vegetables - increase social activities, brain stimulation, games, puzzles, hobbies, crafts, arts, music - aim for at least 7-8 hours sleep per night (or more) - avoid smoking and alcohol - caregiver resources provided - caution with medications, finances; no driving  GAIT DIFFICULTY / DIZZINESS / BALANCE ISSUES (since ~2018; history of bilateral knee replacements; lumbar spinal stenosis) - recommend PT evaluation; use cane / walker  LUMBAR SPINAL STENOSIS / COMPRESSION FRACTURE - follow up with neurosurgery as needed  Meds ordered this encounter  Medications   memantine (NAMENDA) 10 MG tablet    Sig: Take 1 tablet (10 mg total) by mouth 2 (two) times daily.    Dispense:  60 tablet    Refill:  12   Return for pending if symptoms worsen or fail to improve.  I spent 40 minutes of face-to-face and non-face-to-face time with patient.  This included previsit chart review, lab review, study review, order entry, electronic health record documentation, patient education.     [birth year]-1982, MD 05/28/2021, 3:38 PM Certified in Neurology, Neurophysiology and  Neuroimaging  [facility] [address] [place], [place] [phone]

## 2021-06-24 DIAGNOSIS — L400 Psoriasis vulgaris: Secondary | ICD-10-CM | POA: Diagnosis not present

## 2021-07-08 DIAGNOSIS — H524 Presbyopia: Secondary | ICD-10-CM | POA: Diagnosis not present

## 2021-07-24 DIAGNOSIS — I10 Essential (primary) hypertension: Secondary | ICD-10-CM | POA: Diagnosis not present

## 2021-07-24 DIAGNOSIS — E119 Type 2 diabetes mellitus without complications: Secondary | ICD-10-CM | POA: Diagnosis not present

## 2021-07-24 DIAGNOSIS — F419 Anxiety disorder, unspecified: Secondary | ICD-10-CM | POA: Diagnosis not present

## 2021-09-13 IMAGING — US US BREAST*L* LIMITED INC AXILLA
1 series · 8 of 8 positions shown · non-contrast
Comparison: October 05, 2019 and earlier priors

CLINICAL DATA: 78-year-old patient recalled for evaluation of left
breast calcifications and possible asymmetry.

EXAM:
DIGITAL DIAGNOSTIC LEFT MAMMOGRAM WITH CAD AND TOMO
ULTRASOUND LEFT BREAST

[Series 1: us breast*left* limited inc axilla · 0.06mm/px · 8 of 8 slices shown]
[im 1/8]
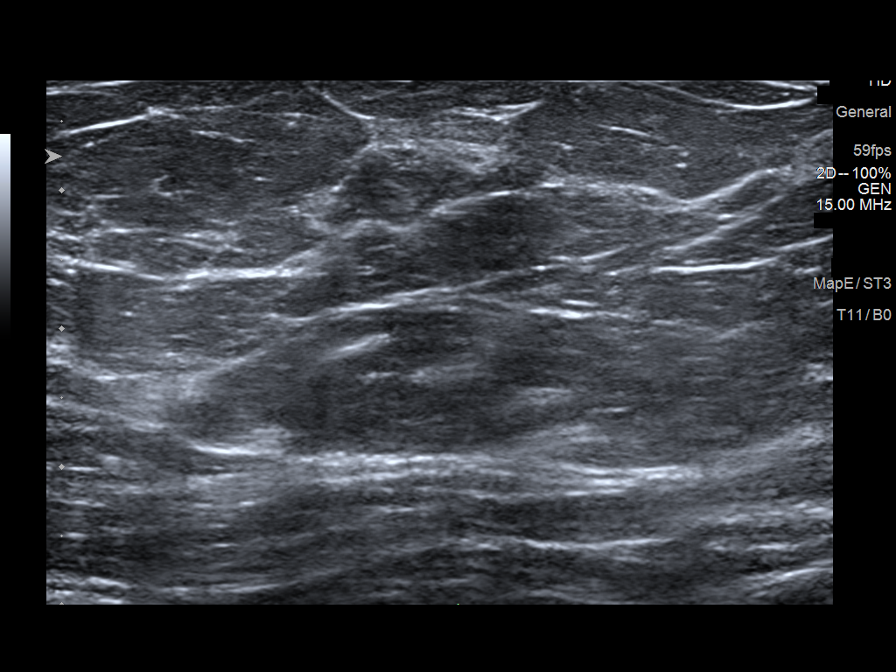
[im 2/8]
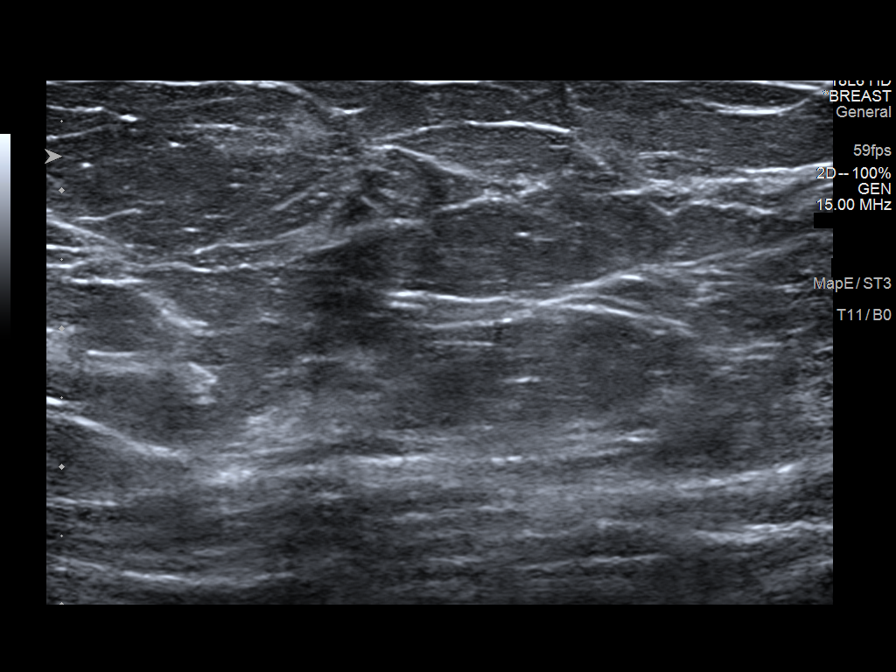
[im 3/8]
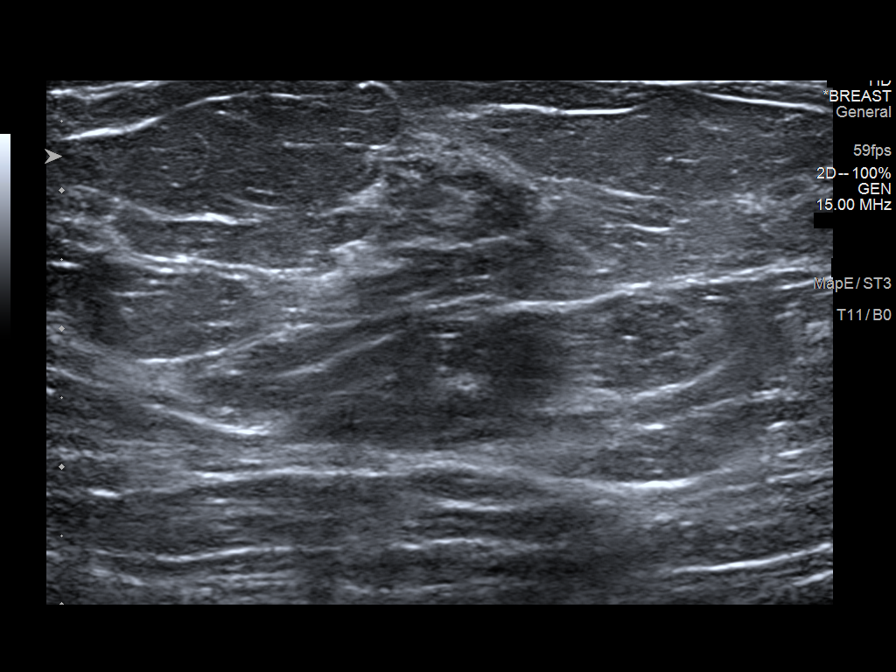
[im 4/8]
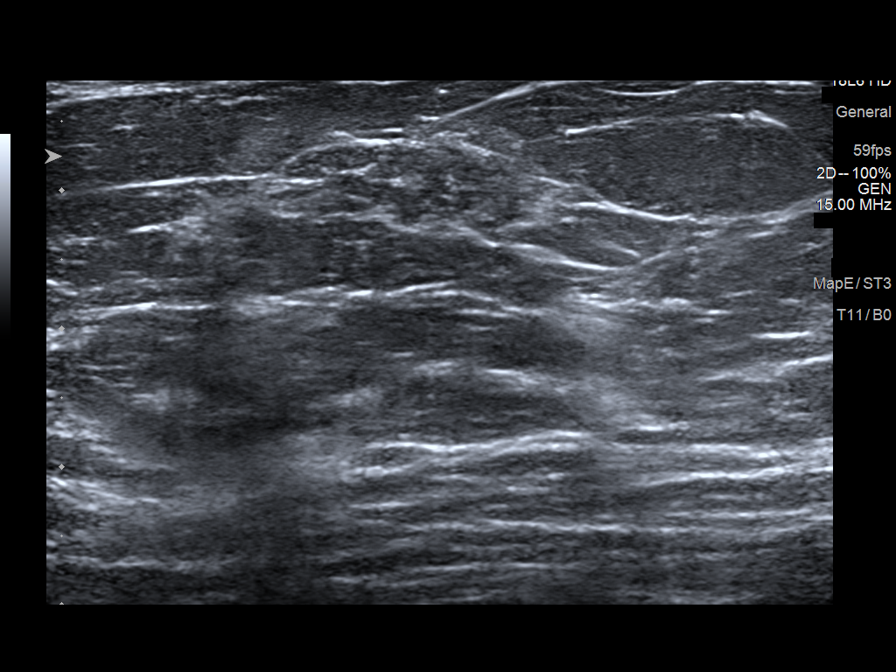
[im 5/8]
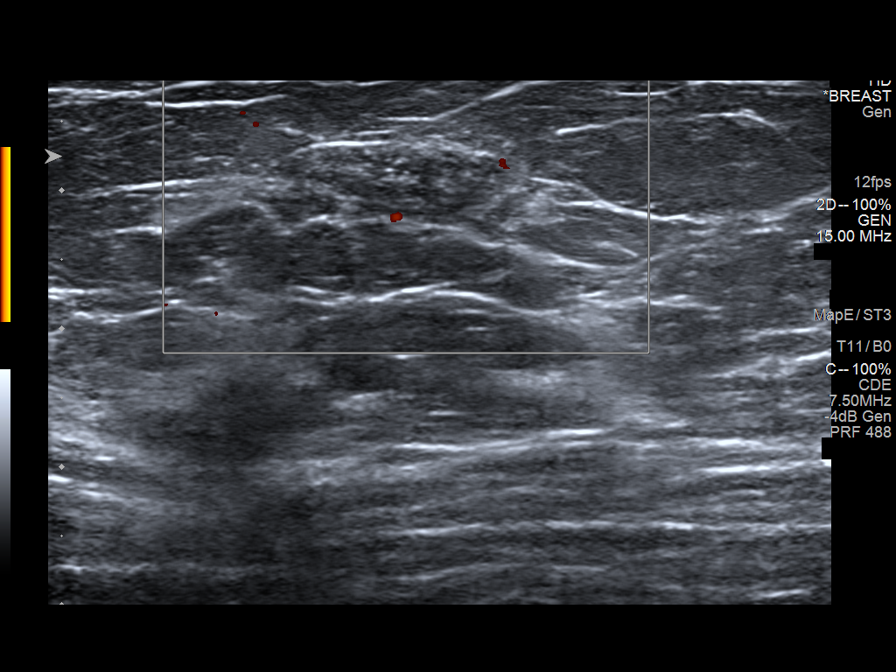
[im 6/8]
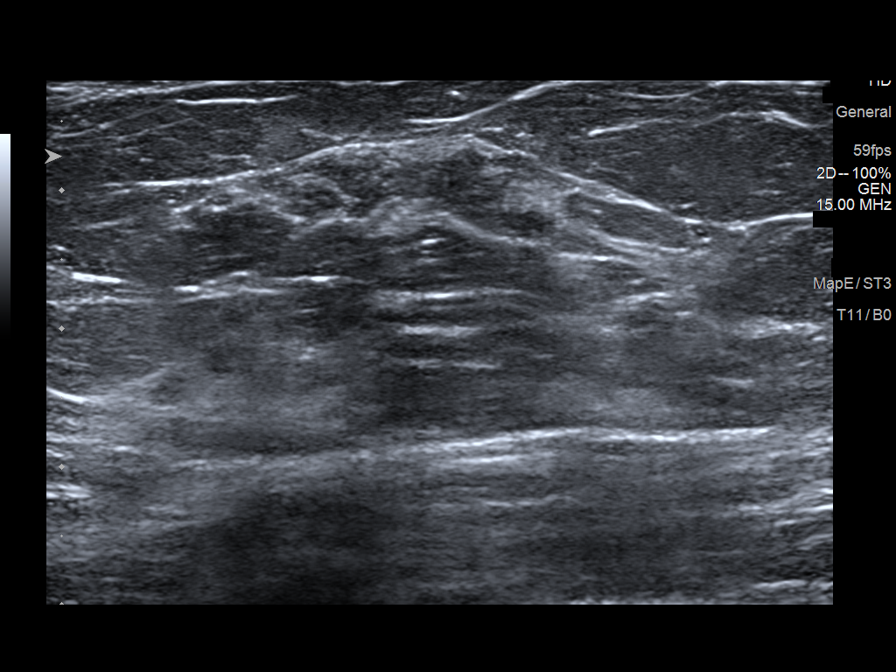
[im 7/8]
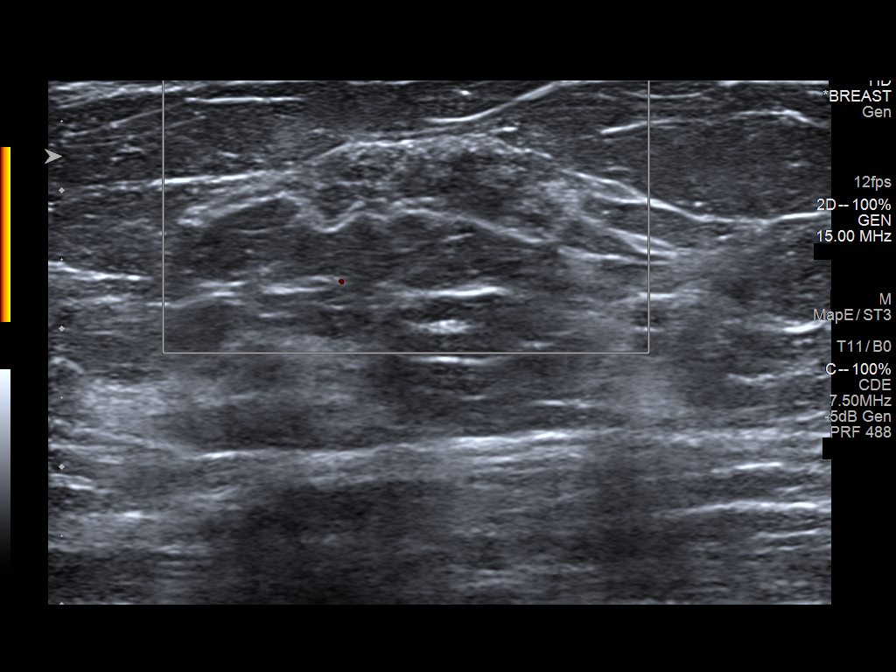
[im 8/8]
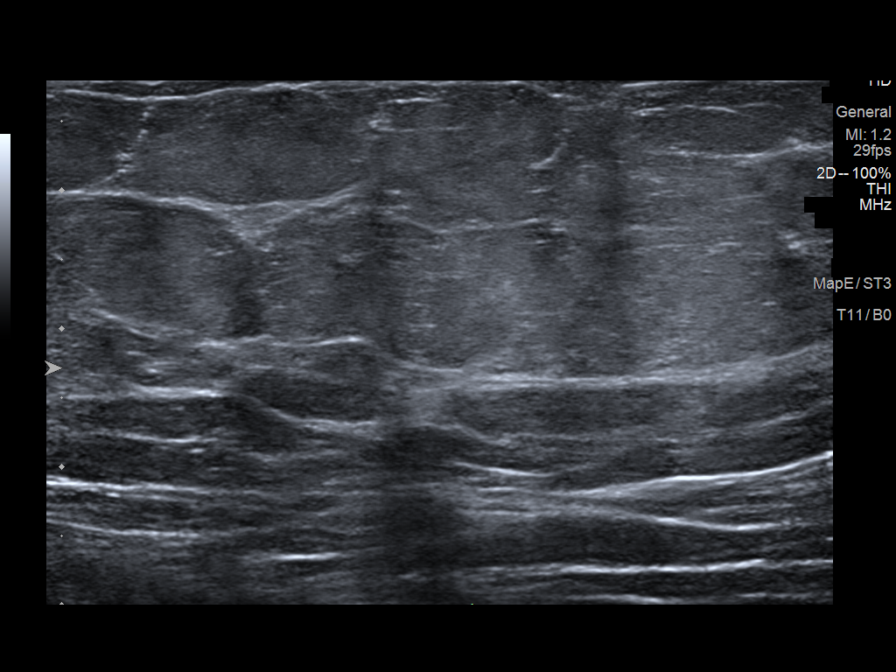

[8 of 8 positions shown; findings below may reference images not displayed]

ACR Breast Density Category c: The breast tissue is heterogeneously
dense, which may obscure small masses.
FINDINGS: Magnification views of the anterior third of the left breast show
smooth, dense rod-like calcifications consistent with benign
secretory calcifications. Additionally, there are scattered and
loosely grouped punctate calcifications that appear mammographically
stable. No suspicious microcalcifications identified. Spot
compression view of the superior left breast shows a probable island
of fibroglandular tissue, similar in appearance to prior mammograms.
This will be evaluated with ultrasound.

Mammographic images were processed with CAD.

Targeted ultrasound is performed, showing ultrasound of the superior
left breast shows a island of glandular tissue in the 1 o'clock axis
that likely corresponds to the asymmetry in question on the recent
screening mammogram. No suspicious findings are identified on
ultrasound.
IMPRESSION: No evidence of malignancy in the left breast. Benign appearing left
breast calcifications.

RECOMMENDATION:
Screening mammogram in one year.(Code:P9-[id number])

I have discussed the findings and recommendations with the patient.
If applicable, a reminder letter will be sent to the patient
regarding the next appointment.

BI-RADS CATEGORY  2: Benign.

## 2021-11-05 DIAGNOSIS — E1169 Type 2 diabetes mellitus with other specified complication: Secondary | ICD-10-CM | POA: Diagnosis not present

## 2021-11-05 DIAGNOSIS — F419 Anxiety disorder, unspecified: Secondary | ICD-10-CM | POA: Diagnosis not present

## 2021-11-05 DIAGNOSIS — E785 Hyperlipidemia, unspecified: Secondary | ICD-10-CM | POA: Diagnosis not present

## 2021-11-05 DIAGNOSIS — Z0000 Encounter for general adult medical examination without abnormal findings: Secondary | ICD-10-CM | POA: Diagnosis not present

## 2021-11-05 DIAGNOSIS — Z1331 Encounter for screening for depression: Secondary | ICD-10-CM | POA: Diagnosis not present

## 2021-12-09 DIAGNOSIS — G3184 Mild cognitive impairment, so stated: Secondary | ICD-10-CM | POA: Diagnosis not present

## 2021-12-09 DIAGNOSIS — R296 Repeated falls: Secondary | ICD-10-CM | POA: Diagnosis not present

## 2021-12-09 DIAGNOSIS — R2681 Unsteadiness on feet: Secondary | ICD-10-CM | POA: Diagnosis not present

## 2022-01-03 DIAGNOSIS — E119 Type 2 diabetes mellitus without complications: Secondary | ICD-10-CM | POA: Diagnosis not present

## 2022-01-03 DIAGNOSIS — G4733 Obstructive sleep apnea (adult) (pediatric): Secondary | ICD-10-CM | POA: Diagnosis not present

## 2022-01-03 DIAGNOSIS — E559 Vitamin D deficiency, unspecified: Secondary | ICD-10-CM | POA: Diagnosis not present

## 2022-01-03 DIAGNOSIS — F0283 Dementia in other diseases classified elsewhere, unspecified severity, with mood disturbance: Secondary | ICD-10-CM | POA: Diagnosis not present

## 2022-01-03 DIAGNOSIS — H9190 Unspecified hearing loss, unspecified ear: Secondary | ICD-10-CM | POA: Diagnosis not present

## 2022-01-03 DIAGNOSIS — N3281 Overactive bladder: Secondary | ICD-10-CM | POA: Diagnosis not present

## 2022-01-03 DIAGNOSIS — K5790 Diverticulosis of intestine, part unspecified, without perforation or abscess without bleeding: Secondary | ICD-10-CM | POA: Diagnosis not present

## 2022-01-03 DIAGNOSIS — F0284 Dementia in other diseases classified elsewhere, unspecified severity, with anxiety: Secondary | ICD-10-CM | POA: Diagnosis not present

## 2022-01-03 DIAGNOSIS — Z6828 Body mass index (BMI) 28.0-28.9, adult: Secondary | ICD-10-CM | POA: Diagnosis not present

## 2022-01-03 DIAGNOSIS — E669 Obesity, unspecified: Secondary | ICD-10-CM | POA: Diagnosis not present

## 2022-01-03 DIAGNOSIS — F32A Depression, unspecified: Secondary | ICD-10-CM | POA: Diagnosis not present

## 2022-01-03 DIAGNOSIS — I6529 Occlusion and stenosis of unspecified carotid artery: Secondary | ICD-10-CM | POA: Diagnosis not present

## 2022-01-03 DIAGNOSIS — Z7984 Long term (current) use of oral hypoglycemic drugs: Secondary | ICD-10-CM | POA: Diagnosis not present

## 2022-01-03 DIAGNOSIS — E785 Hyperlipidemia, unspecified: Secondary | ICD-10-CM | POA: Diagnosis not present

## 2022-01-03 DIAGNOSIS — I10 Essential (primary) hypertension: Secondary | ICD-10-CM | POA: Diagnosis not present

## 2022-01-03 DIAGNOSIS — G309 Alzheimer's disease, unspecified: Secondary | ICD-10-CM | POA: Diagnosis not present

## 2022-01-14 ENCOUNTER — Encounter (HOSPITAL_BASED_OUTPATIENT_CLINIC_OR_DEPARTMENT_OTHER): Payer: Self-pay

## 2022-01-14 ENCOUNTER — Emergency Department (HOSPITAL_BASED_OUTPATIENT_CLINIC_OR_DEPARTMENT_OTHER): Payer: Medicare Other

## 2022-01-14 ENCOUNTER — Other Ambulatory Visit: Payer: Self-pay

## 2022-01-14 DIAGNOSIS — W01198A Fall on same level from slipping, tripping and stumbling with subsequent striking against other object, initial encounter: Secondary | ICD-10-CM | POA: Insufficient documentation

## 2022-01-14 DIAGNOSIS — E119 Type 2 diabetes mellitus without complications: Secondary | ICD-10-CM | POA: Diagnosis not present

## 2022-01-14 DIAGNOSIS — S0003XA Contusion of scalp, initial encounter: Secondary | ICD-10-CM | POA: Diagnosis not present

## 2022-01-14 DIAGNOSIS — S0990XA Unspecified injury of head, initial encounter: Secondary | ICD-10-CM | POA: Diagnosis present

## 2022-01-14 DIAGNOSIS — S0101XA Laceration without foreign body of scalp, initial encounter: Secondary | ICD-10-CM | POA: Diagnosis not present

## 2022-01-14 DIAGNOSIS — Y92009 Unspecified place in unspecified non-institutional (private) residence as the place of occurrence of the external cause: Secondary | ICD-10-CM | POA: Insufficient documentation

## 2022-01-14 DIAGNOSIS — I10 Essential (primary) hypertension: Secondary | ICD-10-CM | POA: Diagnosis not present

## 2022-01-14 NOTE — ED Triage Notes (Addendum)
Patient here POV from Home.  Endorses Falling today approximately 1.5 Hours ago. Patient lost her Balance and fell onto her Head against her Head.   No LOC. No Anticoagulants. No Other injuries besides Mild Abrasion to Left Posterior Forearm. Tetanus UTD. 4-5 cm Laceration to Head with Bleeding Controlled in Triage.   NAD Noted during Triage. A&Ox4. GCS 15. BIB Personal Wheelchair.

## 2022-01-15 ENCOUNTER — Emergency Department (HOSPITAL_BASED_OUTPATIENT_CLINIC_OR_DEPARTMENT_OTHER)
Admission: EM | Admit: 2022-01-15 | Discharge: 2022-01-15 | Disposition: A | Discharge status: Home / Self Care | Payer: Medicare Other | Attending: Emergency Medicine | Admitting: Emergency Medicine

## 2022-01-15 DIAGNOSIS — W19XXXA Unspecified fall, initial encounter: Secondary | ICD-10-CM

## 2022-01-15 DIAGNOSIS — S0101XA Laceration without foreign body of scalp, initial encounter: Secondary | ICD-10-CM

## 2022-01-15 NOTE — ED Notes (Signed)
Pt.'s wound cleansed, loose 4x4's placed over wound

## 2022-01-15 NOTE — ED Notes (Signed)
Patient OOB to BR via w/c; denies dizziness

## 2022-01-15 NOTE — ED Provider Notes (Signed)
[facility] EM[facility] DEPT Provider Note   CSN: [id number] Arrival date & time: 01/14/22  2240     History  Chief Complaint  Patient presents with   Tiffany Burke is a 81 y.o. female.  The history is provided by the patient and the spouse.  Fall  She has history of hypertension, diabetes, hyperlipidemia, carotid vascular disease and comes in following a fall at home.  She got out of the shower and apparently lost her balance and fell and hit her head on the wastebasket causing a laceration.  There was no loss of consciousness.  Per husband, mental status is at her baseline.  She is up-to-date on tetanus immunizations.   Home Medications Prior to Admission medications   Medication Sig Start Date End Date Taking? Authorizing Provider  atorvastatin (LIPITOR) 20 MG tablet Take 20 mg by mouth at bedtime.    [provider]  Cholecalciferol (D3-1000 PO) Take 1,000 Units by mouth daily.    [provider]  citalopram (CELEXA) 40 MG tablet Take 40 mg by mouth every morning.  Patient not taking: Reported on 05/28/2021    [provider]  losartan (COZAAR) 50 MG tablet Take 25 mg by mouth daily. 01/06/21   [provider]  memantine (NAMENDA) 10 MG tablet Take 1 tablet (10 mg total) by mouth 2 (two) times daily. 05/28/21   Penumalli, Earlean Polka, MD  Multiple Vitamins-Minerals (PRESERVISION AREDS 2 PO) Take 1 tablet by mouth in the morning and at bedtime.    [provider]  nitroGLYCERIN (NITROSTAT) 0.4 MG SL tablet Place 0.4 mg under the tongue every 5 (five) minutes as needed for chest pain. Patient not taking: Reported on 05/28/2021 08/02/13   [provider]      Allergies    Penicillins and Vancomycin    Review of Systems   Review of Systems  All other systems reviewed and are negative.   Physical Exam Updated Vital Signs BP (!) 149/87   Pulse 81   Temp 98 F (36.7 C) (Temporal)   Resp 18    Ht 5\' 2"  (1.575 m)   Wt 71.7 kg   SpO2 100%   BMI 28.91 kg/m  Physical Exam Vitals and nursing note reviewed.   81 year old female, resting comfortably and in no acute distress. Vital signs are significant for elevated blood pressure. Oxygen saturation is 100%, which is normal. Head is normocephalic.  Frontal scalp lacerations present. PERRLA, EOMI. Oropharynx is clear. Neck is nontender without adenopathy or JVD. Back is nontender and there is no CVA tenderness. Lungs are clear without rales, wheezes, or rhonchi. Chest is nontender. Heart has regular rate and rhythm without murmur. Abdomen is soft, flat, nontender. Extremities have 1+ edema, full range of motion is present. Skin is warm and dry without rash. Neurologic: Awake and alert, cranial nerves are intact, moves all extremities equally.  ED Results / Procedures / Treatments   Labs (all labs ordered are listed, but only abnormal results are displayed) Labs Reviewed - No data to display  EKG None  Radiology CT Head Wo Contrast  Result Date: 01/14/2022 CLINICAL DATA:  Head trauma, moderate to severe. No loss of consciousness. EXAM: CT HEAD WITHOUT CONTRAST TECHNIQUE: Contiguous axial images were obtained from the base of the skull through the vertex without intravenous contrast. RADIATION DOSE REDUCTION: This exam was performed according to the departmental dose-optimization program which includes automated exposure control, adjustment of the mA and/or kV  according to patient size and/or use of iterative reconstruction technique. COMPARISON:  03/26/2021 FINDINGS: Brain: Diffuse cerebral atrophy. Ventricular dilatation consistent with central atrophy. Low-attenuation changes in the deep white matter consistent with small vessel ischemia. No abnormal extra-axial fluid collections. No mass effect or midline shift. Gray-white matter junctions are distinct. Basal cisterns are not effaced. No acute intracranial hemorrhage. Vascular: No  hyperdense vessel or unexpected calcification. Skull: Normal. Negative for fracture or focal lesion. Sinuses/Orbits: No acute finding. Other: Subcutaneous scalp hematoma and laceration in the left anterior frontal vertex. IMPRESSION: No acute intracranial abnormalities. Chronic atrophy and small vessel ischemic changes. Electronically Signed   By: Burman Nieves M.D.   On: 01/14/2022 23:24    Procedures .Marland KitchenLaceration Repair  Date/Time: 01/15/2022 2:26 AM  Performed by: Dione Booze, MD Authorized by: Dione Booze, MD   Consent:    Consent obtained:  Verbal   Consent given by:  Patient   Risks, benefits, and alternatives were discussed: yes     Risks discussed:  Infection and pain   Alternatives discussed:  No treatment Universal protocol:    Procedure explained and questions answered to patient or proxy's satisfaction: yes     Relevant documents present and verified: yes     Test results available: yes     Imaging studies available: yes     Required blood products, implants, devices, and special equipment available: yes     Site/side marked: yes     Immediately prior to procedure, a time out was called: yes     Patient identity confirmed:  Verbally with patient and arm band Anesthesia:    Anesthesia method:  None Laceration details:    Location:  Scalp   Scalp location:  Frontal   Length (cm):  8   Depth (mm):  3 Pre-procedure details:    Preparation:  Patient was prepped and draped in usual sterile fashion and imaging obtained to evaluate for foreign bodies Exploration:    Limited defect created (wound extended): no     Hemostasis achieved with:  Direct pressure   Imaging obtained: x-ray     Imaging outcome: foreign body not noted     Wound exploration: entire depth of wound visualized     Wound extent: no foreign body and no underlying fracture     Contaminated: no   Treatment:    Area cleansed with:  Saline   Amount of cleaning:  Standard   Debridement:  None    Undermining:  None   Scar revision: no   Skin repair:    Repair method:  Staples   Number of staples:  10 Approximation:    Approximation:  Close Repair type:    Repair type:  Simple Post-procedure details:    Dressing:  Open (no dressing)   Procedure completion:  Tolerated well, no immediate complications     Medications Ordered in ED Medications - No data to display  ED Course/ Medical Decision Making/ A&P                           Medical Decision Making Amount and/or Complexity of Data Reviewed Radiology: ordered.   Fall at home with scalp laceration.  CT of head was obtained to rule out intracranial injury.  CT scan shows no evidence of intracranial injury.  I have independently viewed the images, and agree with radiologist interpretation.  Laceration is closed with staples.  She is discharged with instructions to have staples removed  in 7-10 days.  Final Clinical Impression(s) / ED Diagnoses Final diagnoses:  Fall at home, initial encounter  Laceration of scalp, initial encounter    Rx / DC Orders ED Discharge Orders     None         Dione Booze, MD 01/15/22 [phone]

## 2022-01-23 DIAGNOSIS — M6281 Muscle weakness (generalized): Secondary | ICD-10-CM | POA: Diagnosis not present

## 2022-01-23 DIAGNOSIS — G4733 Obstructive sleep apnea (adult) (pediatric): Secondary | ICD-10-CM | POA: Diagnosis not present

## 2022-01-23 DIAGNOSIS — E785 Hyperlipidemia, unspecified: Secondary | ICD-10-CM | POA: Diagnosis not present

## 2022-01-23 DIAGNOSIS — F0283 Dementia in other diseases classified elsewhere, unspecified severity, with mood disturbance: Secondary | ICD-10-CM | POA: Diagnosis not present

## 2022-02-03 DIAGNOSIS — E559 Vitamin D deficiency, unspecified: Secondary | ICD-10-CM | POA: Diagnosis not present

## 2022-02-03 DIAGNOSIS — E669 Obesity, unspecified: Secondary | ICD-10-CM | POA: Diagnosis not present

## 2022-02-03 DIAGNOSIS — G4733 Obstructive sleep apnea (adult) (pediatric): Secondary | ICD-10-CM | POA: Diagnosis not present

## 2022-02-03 DIAGNOSIS — Z6828 Body mass index (BMI) 28.0-28.9, adult: Secondary | ICD-10-CM | POA: Diagnosis not present

## 2022-02-03 DIAGNOSIS — N3281 Overactive bladder: Secondary | ICD-10-CM | POA: Diagnosis not present

## 2022-02-03 DIAGNOSIS — E119 Type 2 diabetes mellitus without complications: Secondary | ICD-10-CM | POA: Diagnosis not present

## 2022-02-03 DIAGNOSIS — F0284 Dementia in other diseases classified elsewhere, unspecified severity, with anxiety: Secondary | ICD-10-CM | POA: Diagnosis not present

## 2022-02-03 DIAGNOSIS — I6529 Occlusion and stenosis of unspecified carotid artery: Secondary | ICD-10-CM | POA: Diagnosis not present

## 2022-02-03 DIAGNOSIS — E785 Hyperlipidemia, unspecified: Secondary | ICD-10-CM | POA: Diagnosis not present

## 2022-02-03 DIAGNOSIS — F0283 Dementia in other diseases classified elsewhere, unspecified severity, with mood disturbance: Secondary | ICD-10-CM | POA: Diagnosis not present

## 2022-02-03 DIAGNOSIS — Z7984 Long term (current) use of oral hypoglycemic drugs: Secondary | ICD-10-CM | POA: Diagnosis not present

## 2022-02-03 DIAGNOSIS — G309 Alzheimer's disease, unspecified: Secondary | ICD-10-CM | POA: Diagnosis not present

## 2022-02-03 DIAGNOSIS — H9190 Unspecified hearing loss, unspecified ear: Secondary | ICD-10-CM | POA: Diagnosis not present

## 2022-02-03 DIAGNOSIS — I10 Essential (primary) hypertension: Secondary | ICD-10-CM | POA: Diagnosis not present

## 2022-02-03 DIAGNOSIS — F32A Depression, unspecified: Secondary | ICD-10-CM | POA: Diagnosis not present

## 2022-02-03 DIAGNOSIS — K5790 Diverticulosis of intestine, part unspecified, without perforation or abscess without bleeding: Secondary | ICD-10-CM | POA: Diagnosis not present

## 2022-02-22 DIAGNOSIS — G4733 Obstructive sleep apnea (adult) (pediatric): Secondary | ICD-10-CM | POA: Diagnosis not present

## 2022-02-22 DIAGNOSIS — F0283 Dementia in other diseases classified elsewhere, unspecified severity, with mood disturbance: Secondary | ICD-10-CM | POA: Diagnosis not present

## 2022-02-22 DIAGNOSIS — M6281 Muscle weakness (generalized): Secondary | ICD-10-CM | POA: Diagnosis not present

## 2022-02-22 DIAGNOSIS — E785 Hyperlipidemia, unspecified: Secondary | ICD-10-CM | POA: Diagnosis not present

## 2022-03-04 DIAGNOSIS — N3281 Overactive bladder: Secondary | ICD-10-CM | POA: Diagnosis not present

## 2022-03-04 DIAGNOSIS — I10 Essential (primary) hypertension: Secondary | ICD-10-CM | POA: Diagnosis not present

## 2022-03-04 DIAGNOSIS — G309 Alzheimer's disease, unspecified: Secondary | ICD-10-CM | POA: Diagnosis not present

## 2022-03-04 DIAGNOSIS — Z6828 Body mass index (BMI) 28.0-28.9, adult: Secondary | ICD-10-CM | POA: Diagnosis not present

## 2022-03-04 DIAGNOSIS — K5790 Diverticulosis of intestine, part unspecified, without perforation or abscess without bleeding: Secondary | ICD-10-CM | POA: Diagnosis not present

## 2022-03-04 DIAGNOSIS — I6529 Occlusion and stenosis of unspecified carotid artery: Secondary | ICD-10-CM | POA: Diagnosis not present

## 2022-03-04 DIAGNOSIS — E119 Type 2 diabetes mellitus without complications: Secondary | ICD-10-CM | POA: Diagnosis not present

## 2022-03-04 DIAGNOSIS — E669 Obesity, unspecified: Secondary | ICD-10-CM | POA: Diagnosis not present

## 2022-03-04 DIAGNOSIS — F32A Depression, unspecified: Secondary | ICD-10-CM | POA: Diagnosis not present

## 2022-03-04 DIAGNOSIS — G4733 Obstructive sleep apnea (adult) (pediatric): Secondary | ICD-10-CM | POA: Diagnosis not present

## 2022-03-04 DIAGNOSIS — Z7984 Long term (current) use of oral hypoglycemic drugs: Secondary | ICD-10-CM | POA: Diagnosis not present

## 2022-03-04 DIAGNOSIS — E559 Vitamin D deficiency, unspecified: Secondary | ICD-10-CM | POA: Diagnosis not present

## 2022-03-04 DIAGNOSIS — F0283 Dementia in other diseases classified elsewhere, unspecified severity, with mood disturbance: Secondary | ICD-10-CM | POA: Diagnosis not present

## 2022-03-04 DIAGNOSIS — F0284 Dementia in other diseases classified elsewhere, unspecified severity, with anxiety: Secondary | ICD-10-CM | POA: Diagnosis not present

## 2022-03-04 DIAGNOSIS — E785 Hyperlipidemia, unspecified: Secondary | ICD-10-CM | POA: Diagnosis not present

## 2022-03-04 DIAGNOSIS — H9190 Unspecified hearing loss, unspecified ear: Secondary | ICD-10-CM | POA: Diagnosis not present

## 2022-05-05 DIAGNOSIS — E785 Hyperlipidemia, unspecified: Secondary | ICD-10-CM | POA: Diagnosis not present

## 2022-05-05 DIAGNOSIS — G3184 Mild cognitive impairment, so stated: Secondary | ICD-10-CM | POA: Diagnosis not present

## 2022-05-05 DIAGNOSIS — I10 Essential (primary) hypertension: Secondary | ICD-10-CM | POA: Diagnosis not present

## 2022-05-05 DIAGNOSIS — E1165 Type 2 diabetes mellitus with hyperglycemia: Secondary | ICD-10-CM | POA: Diagnosis not present

## 2022-05-11 ENCOUNTER — Other Ambulatory Visit: Payer: Self-pay

## 2022-05-11 MED ORDER — MEMANTINE HCL 10 MG PO TABS: 10.0000 mg | ORAL_TABLET | Freq: Two times a day (BID) | ORAL | 0 refills | Status: DC

## 2022-05-28 DIAGNOSIS — K080 Exfoliation of teeth due to systemic causes: Secondary | ICD-10-CM | POA: Diagnosis not present

## 2022-07-21 ENCOUNTER — Other Ambulatory Visit: Payer: Self-pay | Admitting: Diagnostic Neuroimaging

## 2022-07-25 ENCOUNTER — Other Ambulatory Visit: Payer: Self-pay | Admitting: Diagnostic Neuroimaging

## 2022-08-20 DIAGNOSIS — I10 Essential (primary) hypertension: Secondary | ICD-10-CM | POA: Diagnosis not present

## 2022-08-20 DIAGNOSIS — E1169 Type 2 diabetes mellitus with other specified complication: Secondary | ICD-10-CM | POA: Diagnosis not present

## 2022-08-20 DIAGNOSIS — E785 Hyperlipidemia, unspecified: Secondary | ICD-10-CM | POA: Diagnosis not present

## 2022-08-31 DIAGNOSIS — F03B0 Unspecified dementia, moderate, without behavioral disturbance, psychotic disturbance, mood disturbance, and anxiety: Secondary | ICD-10-CM | POA: Diagnosis not present

## 2022-08-31 DIAGNOSIS — E119 Type 2 diabetes mellitus without complications: Secondary | ICD-10-CM | POA: Diagnosis not present

## 2022-08-31 DIAGNOSIS — N761 Subacute and chronic vaginitis: Secondary | ICD-10-CM | POA: Diagnosis not present

## 2022-08-31 DIAGNOSIS — R399 Unspecified symptoms and signs involving the genitourinary system: Secondary | ICD-10-CM | POA: Diagnosis not present

## 2022-11-02 DIAGNOSIS — R809 Proteinuria, unspecified: Secondary | ICD-10-CM | POA: Diagnosis not present

## 2022-11-02 DIAGNOSIS — E119 Type 2 diabetes mellitus without complications: Secondary | ICD-10-CM | POA: Diagnosis not present

## 2022-11-02 DIAGNOSIS — E785 Hyperlipidemia, unspecified: Secondary | ICD-10-CM | POA: Diagnosis not present

## 2022-11-02 DIAGNOSIS — F03B0 Unspecified dementia, moderate, without behavioral disturbance, psychotic disturbance, mood disturbance, and anxiety: Secondary | ICD-10-CM | POA: Diagnosis not present

## 2022-11-02 DIAGNOSIS — Z0000 Encounter for general adult medical examination without abnormal findings: Secondary | ICD-10-CM | POA: Diagnosis not present

## 2022-11-02 DIAGNOSIS — E559 Vitamin D deficiency, unspecified: Secondary | ICD-10-CM | POA: Diagnosis not present

## 2022-11-02 DIAGNOSIS — Z1331 Encounter for screening for depression: Secondary | ICD-10-CM | POA: Diagnosis not present

## 2022-12-02 DIAGNOSIS — K080 Exfoliation of teeth due to systemic causes: Secondary | ICD-10-CM | POA: Diagnosis not present

## 2023-01-09 ENCOUNTER — Encounter (HOSPITAL_COMMUNITY): Payer: Self-pay | Admitting: Emergency Medicine

## 2023-01-09 ENCOUNTER — Emergency Department (HOSPITAL_COMMUNITY): Payer: Medicare Other

## 2023-01-09 ENCOUNTER — Inpatient Hospital Stay (HOSPITAL_COMMUNITY)
Admission: EM | Admit: 2023-01-09 | Discharge: 2023-01-12 | DRG: 175 | Disposition: A | Discharge status: Home Health | Payer: Medicare Other | Attending: Internal Medicine | Admitting: Internal Medicine

## 2023-01-09 DIAGNOSIS — F0394 Unspecified dementia, unspecified severity, with anxiety: Secondary | ICD-10-CM | POA: Diagnosis not present

## 2023-01-09 DIAGNOSIS — Z79899 Other long term (current) drug therapy: Secondary | ICD-10-CM | POA: Diagnosis not present

## 2023-01-09 DIAGNOSIS — F0390 Unspecified dementia without behavioral disturbance: Secondary | ICD-10-CM | POA: Diagnosis not present

## 2023-01-09 DIAGNOSIS — Z8249 Family history of ischemic heart disease and other diseases of the circulatory system: Secondary | ICD-10-CM

## 2023-01-09 DIAGNOSIS — R778 Other specified abnormalities of plasma proteins: Secondary | ICD-10-CM | POA: Diagnosis not present

## 2023-01-09 DIAGNOSIS — R296 Repeated falls: Secondary | ICD-10-CM | POA: Diagnosis not present

## 2023-01-09 DIAGNOSIS — E7800 Pure hypercholesterolemia, unspecified: Secondary | ICD-10-CM | POA: Diagnosis present

## 2023-01-09 DIAGNOSIS — G4733 Obstructive sleep apnea (adult) (pediatric): Secondary | ICD-10-CM | POA: Diagnosis not present

## 2023-01-09 DIAGNOSIS — J90 Pleural effusion, not elsewhere classified: Secondary | ICD-10-CM | POA: Diagnosis not present

## 2023-01-09 DIAGNOSIS — K219 Gastro-esophageal reflux disease without esophagitis: Secondary | ICD-10-CM | POA: Diagnosis not present

## 2023-01-09 DIAGNOSIS — I10 Essential (primary) hypertension: Secondary | ICD-10-CM | POA: Diagnosis not present

## 2023-01-09 DIAGNOSIS — Z87891 Personal history of nicotine dependence: Secondary | ICD-10-CM

## 2023-01-09 DIAGNOSIS — Z833 Family history of diabetes mellitus: Secondary | ICD-10-CM

## 2023-01-09 DIAGNOSIS — W19XXXA Unspecified fall, initial encounter: Secondary | ICD-10-CM | POA: Diagnosis not present

## 2023-01-09 DIAGNOSIS — I739 Peripheral vascular disease, unspecified: Secondary | ICD-10-CM

## 2023-01-09 DIAGNOSIS — D6959 Other secondary thrombocytopenia: Secondary | ICD-10-CM | POA: Diagnosis present

## 2023-01-09 DIAGNOSIS — F0393 Unspecified dementia, unspecified severity, with mood disturbance: Secondary | ICD-10-CM | POA: Diagnosis present

## 2023-01-09 DIAGNOSIS — S22080A Wedge compression fracture of T11-T12 vertebra, initial encounter for closed fracture: Secondary | ICD-10-CM

## 2023-01-09 DIAGNOSIS — R413 Other amnesia: Secondary | ICD-10-CM | POA: Diagnosis not present

## 2023-01-09 DIAGNOSIS — R0902 Hypoxemia: Secondary | ICD-10-CM | POA: Diagnosis not present

## 2023-01-09 DIAGNOSIS — R739 Hyperglycemia, unspecified: Secondary | ICD-10-CM | POA: Diagnosis not present

## 2023-01-09 DIAGNOSIS — Z803 Family history of malignant neoplasm of breast: Secondary | ICD-10-CM

## 2023-01-09 DIAGNOSIS — E119 Type 2 diabetes mellitus without complications: Secondary | ICD-10-CM

## 2023-01-09 DIAGNOSIS — R55 Syncope and collapse: Principal | ICD-10-CM

## 2023-01-09 DIAGNOSIS — F418 Other specified anxiety disorders: Secondary | ICD-10-CM | POA: Diagnosis not present

## 2023-01-09 DIAGNOSIS — Z96653 Presence of artificial knee joint, bilateral: Secondary | ICD-10-CM | POA: Diagnosis not present

## 2023-01-09 DIAGNOSIS — R791 Abnormal coagulation profile: Secondary | ICD-10-CM | POA: Diagnosis present

## 2023-01-09 DIAGNOSIS — E1165 Type 2 diabetes mellitus with hyperglycemia: Secondary | ICD-10-CM | POA: Diagnosis not present

## 2023-01-09 DIAGNOSIS — F429 Obsessive-compulsive disorder, unspecified: Secondary | ICD-10-CM | POA: Diagnosis not present

## 2023-01-09 DIAGNOSIS — J40 Bronchitis, not specified as acute or chronic: Secondary | ICD-10-CM | POA: Diagnosis not present

## 2023-01-09 DIAGNOSIS — R531 Weakness: Secondary | ICD-10-CM | POA: Diagnosis not present

## 2023-01-09 DIAGNOSIS — I517 Cardiomegaly: Secondary | ICD-10-CM | POA: Diagnosis not present

## 2023-01-09 DIAGNOSIS — I672 Cerebral atherosclerosis: Secondary | ICD-10-CM | POA: Diagnosis not present

## 2023-01-09 DIAGNOSIS — R5383 Other fatigue: Secondary | ICD-10-CM | POA: Diagnosis not present

## 2023-01-09 DIAGNOSIS — R7989 Other specified abnormal findings of blood chemistry: Secondary | ICD-10-CM

## 2023-01-09 DIAGNOSIS — I2699 Other pulmonary embolism without acute cor pulmonale: Principal | ICD-10-CM | POA: Diagnosis present

## 2023-01-09 DIAGNOSIS — Z823 Family history of stroke: Secondary | ICD-10-CM

## 2023-01-09 DIAGNOSIS — R918 Other nonspecific abnormal finding of lung field: Secondary | ICD-10-CM | POA: Diagnosis not present

## 2023-01-09 DIAGNOSIS — I2609 Other pulmonary embolism with acute cor pulmonale: Principal | ICD-10-CM | POA: Diagnosis present

## 2023-01-09 DIAGNOSIS — Z86711 Personal history of pulmonary embolism: Secondary | ICD-10-CM | POA: Diagnosis not present

## 2023-01-09 DIAGNOSIS — D696 Thrombocytopenia, unspecified: Secondary | ICD-10-CM | POA: Diagnosis not present

## 2023-01-09 LAB — CBG MONITORING, ED: Glucose-Capillary: 255 mg/dL — ABNORMAL HIGH (ref 70–99)

## 2023-01-09 NOTE — ED Provider Notes (Cosign Needed Addendum)
[place] EMERGENCY DEPARTMENT AT [facility] Provider Note   CSN: [id number] Arrival date & time: 01/09/23  2011     History  Chief Complaint  Patient presents with   Near Syncope    Tiffany Burke is a 82 y.o. female.  Patient here with her husband.  Patient's husband reports that patient had an episode of near syncope.  He reports he and patient were going out to dinner and patient looked up side and fell backwards.  He reports she did not lose consciousness.  He was able to catch her she did not hit the ground she did not sustain any injuries.  Patient's husband reports patient has dementia.  He provides the history for her.  He states patient has had foul-smelling urine.  Patient has not had any new mental changes.  Patient has not had a fever or chills she has not had any cough or congestion.  Patient denies any pain.  She denies chest pain she denies abdominal pain.  The history is provided by the patient. No language interpreter was used.  Near Syncope This is a new problem. The current episode started less than 1 hour ago. The problem occurs constantly.       Home Medications Prior to Admission medications   Medication Sig Start Date End Date Taking? Authorizing Provider  atorvastatin (LIPITOR) 20 MG tablet Take 20 mg by mouth at bedtime.    [provider]  Cholecalciferol (D3-1000 PO) Take 1,000 Units by mouth daily.    [provider]  citalopram (CELEXA) 40 MG tablet Take 40 mg by mouth every morning.  Patient not taking: Reported on 05/28/2021    [provider]  losartan (COZAAR) 50 MG tablet Take 25 mg by mouth daily. 01/06/21   [provider]  memantine (NAMENDA) 10 MG tablet Take 1 tablet by mouth twice daily 07/28/22   Penumalli, Glenford Bayley, MD  Multiple Vitamins-Minerals (PRESERVISION AREDS 2 PO) Take 1 tablet by mouth in the morning and at bedtime.    [provider]  nitroGLYCERIN (NITROSTAT) 0.4  MG SL tablet Place 0.4 mg under the tongue every 5 (five) minutes as needed for chest pain. Patient not taking: Reported on 05/28/2021 08/02/13   [provider]      Allergies    Penicillins and Vancomycin    Review of Systems   Review of Systems  Cardiovascular:  Positive for near-syncope.  All other systems reviewed and are negative.   Physical Exam Updated Vital Signs BP 92/70   Pulse (!) 107   Temp 98.8 F (37.1 C) (Oral)   Resp 20   SpO2 94%  Physical Exam Vitals and nursing note reviewed.  Constitutional:      Appearance: She is well-developed.  HENT:     Head: Normocephalic.     Mouth/Throat:     Mouth: Mucous membranes are moist.  Eyes:     Pupils: Pupils are equal, round, and reactive to light.  Cardiovascular:     Rate and Rhythm: Normal rate.     Pulses: Normal pulses.  Pulmonary:     Effort: Pulmonary effort is normal.  Abdominal:     General: There is no distension.  Musculoskeletal:        General: Normal range of motion.     Cervical back: Normal range of motion.  Skin:    General: Skin is warm.  Neurological:     General: No focal deficit present.  Mental Status: She is alert and oriented to person, place, and time.  Psychiatric:        Mood and Affect: Mood normal.     ED Results / Procedures / Treatments   Labs (all labs ordered are listed, but only abnormal results are displayed) Labs Reviewed  CBG MONITORING, ED - Abnormal; Notable for the following components:      Result Value   Glucose-Capillary 255 (*)    All other components within normal limits  URINALYSIS, ROUTINE W REFLEX MICROSCOPIC  CBC WITH DIFFERENTIAL/PLATELET  COMPREHENSIVE METABOLIC PANEL  TROPONIN I (HIGH SENSITIVITY)    EKG None  Radiology No results found.  Procedures Procedures    Medications Ordered in ED Medications - No data to display  ED Course/ Medical Decision Making/ A&P                                 Medical Decision  Making Patient's husband reports patient had a near syncopal episode.  He was able to catch her to keep her from falling.  Patient did not lose consciousness.  Patient has had a foul-smelling urine for several days  Amount and/or Complexity of Data Reviewed Independent Historian: spouse    Details: Patient's husband provide has most of her history.  He reports patient has dementia.  He provides her care Labs: ordered. Radiology: ordered. ECG/medicine tests: ordered and independent interpretation performed.    Details: EKG shows sinus tachycardia 107           Final Clinical Impression(s) / ED Diagnoses Final diagnoses:  Near syncope    Rx / DC Orders ED Discharge Orders     None      Pt's care turned over to [facility].  Ct and labs pending   Osie Cheeks 01/09/23 2338    Elson Areas, PA-C 01/10/23 1610    Gwyneth Sprout, MD 01/14/23 [phone]

## 2023-01-09 NOTE — ED Triage Notes (Addendum)
Pt here from home with c/o a near syncopal episode while walking down some steps husband was able to lower pt to ground , unknown loc ,dementia at baseline

## 2023-01-10 ENCOUNTER — Other Ambulatory Visit: Payer: Self-pay

## 2023-01-10 ENCOUNTER — Inpatient Hospital Stay (HOSPITAL_COMMUNITY): Payer: Medicare Other

## 2023-01-10 ENCOUNTER — Emergency Department (HOSPITAL_COMMUNITY): Payer: Medicare Other

## 2023-01-10 DIAGNOSIS — I739 Peripheral vascular disease, unspecified: Secondary | ICD-10-CM

## 2023-01-10 DIAGNOSIS — Z8249 Family history of ischemic heart disease and other diseases of the circulatory system: Secondary | ICD-10-CM | POA: Diagnosis not present

## 2023-01-10 DIAGNOSIS — D696 Thrombocytopenia, unspecified: Secondary | ICD-10-CM | POA: Diagnosis not present

## 2023-01-10 DIAGNOSIS — Z86711 Personal history of pulmonary embolism: Secondary | ICD-10-CM | POA: Diagnosis not present

## 2023-01-10 DIAGNOSIS — Z803 Family history of malignant neoplasm of breast: Secondary | ICD-10-CM | POA: Diagnosis not present

## 2023-01-10 DIAGNOSIS — Z833 Family history of diabetes mellitus: Secondary | ICD-10-CM | POA: Diagnosis not present

## 2023-01-10 DIAGNOSIS — E7800 Pure hypercholesterolemia, unspecified: Secondary | ICD-10-CM | POA: Diagnosis present

## 2023-01-10 DIAGNOSIS — S22080A Wedge compression fracture of T11-T12 vertebra, initial encounter for closed fracture: Secondary | ICD-10-CM

## 2023-01-10 DIAGNOSIS — F0390 Unspecified dementia without behavioral disturbance: Secondary | ICD-10-CM | POA: Diagnosis not present

## 2023-01-10 DIAGNOSIS — R413 Other amnesia: Secondary | ICD-10-CM | POA: Diagnosis not present

## 2023-01-10 DIAGNOSIS — I2699 Other pulmonary embolism without acute cor pulmonale: Secondary | ICD-10-CM | POA: Diagnosis present

## 2023-01-10 DIAGNOSIS — I2609 Other pulmonary embolism with acute cor pulmonale: Secondary | ICD-10-CM

## 2023-01-10 DIAGNOSIS — R55 Syncope and collapse: Secondary | ICD-10-CM | POA: Diagnosis present

## 2023-01-10 DIAGNOSIS — Z79899 Other long term (current) drug therapy: Secondary | ICD-10-CM | POA: Diagnosis not present

## 2023-01-10 DIAGNOSIS — F429 Obsessive-compulsive disorder, unspecified: Secondary | ICD-10-CM | POA: Diagnosis present

## 2023-01-10 DIAGNOSIS — Z823 Family history of stroke: Secondary | ICD-10-CM | POA: Diagnosis not present

## 2023-01-10 DIAGNOSIS — F0393 Unspecified dementia, unspecified severity, with mood disturbance: Secondary | ICD-10-CM | POA: Diagnosis present

## 2023-01-10 DIAGNOSIS — R296 Repeated falls: Secondary | ICD-10-CM

## 2023-01-10 DIAGNOSIS — D6959 Other secondary thrombocytopenia: Secondary | ICD-10-CM | POA: Diagnosis present

## 2023-01-10 DIAGNOSIS — F0394 Unspecified dementia, unspecified severity, with anxiety: Secondary | ICD-10-CM | POA: Diagnosis present

## 2023-01-10 DIAGNOSIS — R791 Abnormal coagulation profile: Secondary | ICD-10-CM | POA: Diagnosis present

## 2023-01-10 DIAGNOSIS — E1165 Type 2 diabetes mellitus with hyperglycemia: Secondary | ICD-10-CM | POA: Diagnosis present

## 2023-01-10 DIAGNOSIS — Z96653 Presence of artificial knee joint, bilateral: Secondary | ICD-10-CM | POA: Diagnosis present

## 2023-01-10 DIAGNOSIS — Z87891 Personal history of nicotine dependence: Secondary | ICD-10-CM | POA: Diagnosis not present

## 2023-01-10 DIAGNOSIS — K219 Gastro-esophageal reflux disease without esophagitis: Secondary | ICD-10-CM | POA: Diagnosis present

## 2023-01-10 DIAGNOSIS — F418 Other specified anxiety disorders: Secondary | ICD-10-CM | POA: Diagnosis present

## 2023-01-10 DIAGNOSIS — E119 Type 2 diabetes mellitus without complications: Secondary | ICD-10-CM

## 2023-01-10 DIAGNOSIS — G4733 Obstructive sleep apnea (adult) (pediatric): Secondary | ICD-10-CM | POA: Diagnosis present

## 2023-01-10 LAB — COMPREHENSIVE METABOLIC PANEL
ALT: 39 U/L (ref 0–44)
ALT: 50 U/L — ABNORMAL HIGH (ref 0–44)
AST: 27 U/L (ref 15–41)
AST: 39 U/L (ref 15–41)
Albumin: 3.4 g/dL — ABNORMAL LOW (ref 3.5–5.0)
Albumin: 4.2 g/dL (ref 3.5–5.0)
Alkaline Phosphatase: 79 U/L (ref 38–126)
Alkaline Phosphatase: 97 U/L (ref 38–126)
Anion gap: 13 (ref 5–15)
Anion gap: 16 — ABNORMAL HIGH (ref 5–15)
BUN: 17 mg/dL (ref 8–23)
BUN: 18 mg/dL (ref 8–23)
CO2: 21 mmol/L — ABNORMAL LOW (ref 22–32)
CO2: 24 mmol/L (ref 22–32)
Calcium: 10 mg/dL (ref 8.9–10.3)
Calcium: 10.1 mg/dL (ref 8.9–10.3)
Chloride: 101 mmol/L (ref 98–111)
Chloride: 102 mmol/L (ref 98–111)
Creatinine, Ser: 0.79 mg/dL (ref 0.44–1.00)
Creatinine, Ser: 0.81 mg/dL (ref 0.44–1.00)
GFR, Estimated: >60 mL/min (ref >60)
GFR, Estimated: >60 mL/min (ref >60)
Glucose, Bld: 165 mg/dL — ABNORMAL HIGH (ref 70–99)
Glucose, Bld: 243 mg/dL — ABNORMAL HIGH (ref 70–99)
Potassium: 4.3 mmol/L (ref 3.5–5.1)
Potassium: 4.4 mmol/L (ref 3.5–5.1)
Sodium: 138 mmol/L (ref 135–145)
Sodium: 139 mmol/L (ref 135–145)
Total Bilirubin: 1.5 mg/dL — ABNORMAL HIGH (ref 0.3–1.2)
Total Bilirubin: 1.7 mg/dL — ABNORMAL HIGH (ref 0.3–1.2)
Total Protein: 6.3 g/dL — ABNORMAL LOW (ref 6.5–8.1)
Total Protein: 7.5 g/dL (ref 6.5–8.1)

## 2023-01-10 LAB — CBC
HCT: 40.2 % (ref 36.0–46.0)
Hemoglobin: 13.8 g/dL (ref 12.0–15.0)
MCH: 31.9 pg (ref 26.0–34.0)
MCHC: 34.3 g/dL (ref 30.0–36.0)
MCV: 93.1 fL (ref 80.0–100.0)
Platelets: 80 10*3/uL — ABNORMAL LOW (ref 150–400)
RBC: 4.32 MIL/uL (ref 3.87–5.11)
RDW: 12.1 % (ref 11.5–15.5)
WBC: 6.7 10*3/uL (ref 4.0–10.5)
nRBC: 0 % (ref 0.0–0.2)

## 2023-01-10 LAB — TROPONIN I (HIGH SENSITIVITY)
Troponin I (High Sensitivity): 379 ng/L (ref <18)
Troponin I (High Sensitivity): 405 ng/L (ref <18)
Troponin I (High Sensitivity): 74 ng/L — ABNORMAL HIGH (ref <18)

## 2023-01-10 LAB — URINALYSIS, ROUTINE W REFLEX MICROSCOPIC
Bilirubin Urine: NEGATIVE
Glucose, UA: >=500 mg/dL — AB
Hgb urine dipstick: NEGATIVE
Ketones, ur: 5 mg/dL — AB
Leukocytes,Ua: NEGATIVE
Nitrite: NEGATIVE
Protein, ur: NEGATIVE mg/dL
Specific Gravity, Urine: 1.021 (ref 1.005–1.030)
pH: 5 (ref 5.0–8.0)

## 2023-01-10 LAB — CBC WITH DIFFERENTIAL/PLATELET
Abs Immature Granulocytes: 0.03 10*3/uL (ref 0.00–0.07)
Basophils Absolute: 0 10*3/uL (ref 0.0–0.1)
Basophils Relative: 1 %
Eosinophils Absolute: 0 10*3/uL (ref 0.0–0.5)
Eosinophils Relative: 0 %
HCT: 46.7 % — ABNORMAL HIGH (ref 36.0–46.0)
Hemoglobin: 15.5 g/dL — ABNORMAL HIGH (ref 12.0–15.0)
Immature Granulocytes: 0 %
Lymphocytes Relative: 12 %
Lymphs Abs: 1 10*3/uL (ref 0.7–4.0)
MCH: 31.7 pg (ref 26.0–34.0)
MCHC: 33.2 g/dL (ref 30.0–36.0)
MCV: 95.5 fL (ref 80.0–100.0)
Monocytes Absolute: 0.9 10*3/uL (ref 0.1–1.0)
Monocytes Relative: 11 %
Neutro Abs: 6.8 10*3/uL (ref 1.7–7.7)
Neutrophils Relative %: 76 %
Platelets: 80 10*3/uL — ABNORMAL LOW (ref 150–400)
RBC: 4.89 MIL/uL (ref 3.87–5.11)
RDW: 12.1 % (ref 11.5–15.5)
WBC: 8.9 10*3/uL (ref 4.0–10.5)
nRBC: 0 % (ref 0.0–0.2)

## 2023-01-10 LAB — MAGNESIUM
Magnesium: 2.1 mg/dL (ref 1.7–2.4)
Magnesium: 2.1 mg/dL (ref 1.7–2.4)

## 2023-01-10 LAB — CBG MONITORING, ED
Glucose-Capillary: 162 mg/dL — ABNORMAL HIGH (ref 70–99)
Glucose-Capillary: 188 mg/dL — ABNORMAL HIGH (ref 70–99)

## 2023-01-10 LAB — GLUCOSE, CAPILLARY
Glucose-Capillary: 195 mg/dL — ABNORMAL HIGH (ref 70–99)
Glucose-Capillary: 152 mg/dL — ABNORMAL HIGH (ref 70–99)

## 2023-01-10 LAB — HEMOGLOBIN A1C
Hgb A1c MFr Bld: 8.1 % — ABNORMAL HIGH (ref 4.8–5.6)
Mean Plasma Glucose: 185.77 mg/dL

## 2023-01-10 LAB — LACTIC ACID, PLASMA
Lactic Acid, Venous: 1.3 mmol/L (ref 0.5–1.9)
Lactic Acid, Venous: 1.5 mmol/L (ref 0.5–1.9)

## 2023-01-10 LAB — APTT
aPTT: >200 s (ref 24–36)
aPTT: 174 s (ref 24–36)

## 2023-01-10 LAB — HEPARIN LEVEL (UNFRACTIONATED)
Heparin Unfractionated: 0.97 [IU]/mL — ABNORMAL HIGH (ref 0.30–0.70)
Heparin Unfractionated: 0.59 [IU]/mL (ref 0.30–0.70)

## 2023-01-10 LAB — PROTIME-INR
INR: 1.2 (ref 0.8–1.2)
Prothrombin Time: 15.8 s — ABNORMAL HIGH (ref 11.4–15.2)

## 2023-01-10 LAB — BRAIN NATRIURETIC PEPTIDE: B Natriuretic Peptide: 169.3 pg/mL — ABNORMAL HIGH (ref 0.0–100.0)

## 2023-01-10 LAB — I-STAT CG4 LACTIC ACID, ED: Lactic Acid, Venous: 1.3 mmol/L (ref 0.5–1.9)

## 2023-01-10 MED ORDER — ORAL CARE MOUTH RINSE: 15.0000 mL | OROMUCOSAL | Status: DC | PRN

## 2023-01-10 MED ORDER — ACETAMINOPHEN 325 MG PO TABS
650.0000 mg | ORAL_TABLET | Freq: Four times a day (QID) | ORAL | Status: DC | PRN
  Filled 2023-01-10: qty 2

## 2023-01-10 MED ORDER — HEPARIN BOLUS VIA INFUSION
4000.0000 [IU] | Freq: Once | INTRAVENOUS | Status: AC
Start: 2023-01-10 — End: 2023-01-10
  Administered 2023-01-10: 4000 [IU] via INTRAVENOUS
  Filled 2023-01-10: qty 4000

## 2023-01-10 MED ORDER — MEMANTINE HCL 10 MG PO TABS
10.0000 mg | ORAL_TABLET | Freq: Two times a day (BID) | ORAL | Status: DC
  Administered 2023-01-10 – 2023-01-12 (×4): 10 mg via ORAL
  Filled 2023-01-10 (×5): qty 1

## 2023-01-10 MED ORDER — HEPARIN (PORCINE) 25000 UT/250ML-% IV SOLN
750.0000 [IU]/h | INTRAVENOUS | Status: DC
Start: 2023-01-10 — End: 2023-01-11
  Administered 2023-01-10: 900 [IU]/h via INTRAVENOUS
  Administered 2023-01-11: 750 [IU]/h via INTRAVENOUS
  Filled 2023-01-10 (×2): qty 250

## 2023-01-10 MED ORDER — DOCUSATE SODIUM 100 MG PO CAPS: 100.0000 mg | ORAL_CAPSULE | Freq: Every day | ORAL | Status: DC | PRN

## 2023-01-10 MED ORDER — MELATONIN 3 MG PO TABS
3.0000 mg | ORAL_TABLET | Freq: Every day | ORAL | Status: DC
  Administered 2023-01-10 – 2023-01-11 (×2): 3 mg via ORAL
  Filled 2023-01-10 (×2): qty 1

## 2023-01-10 MED ORDER — ONDANSETRON HCL 4 MG/2ML IJ SOLN: 4.0000 mg | Freq: Four times a day (QID) | INTRAMUSCULAR | Status: DC | PRN

## 2023-01-10 MED ORDER — LACTATED RINGERS IV SOLN: INTRAVENOUS | Status: AC

## 2023-01-10 MED ORDER — CITALOPRAM HYDROBROMIDE 20 MG PO TABS
20.0000 mg | ORAL_TABLET | Freq: Every evening | ORAL | Status: DC
  Administered 2023-01-10 – 2023-01-11 (×2): 20 mg via ORAL
  Filled 2023-01-10 (×2): qty 1

## 2023-01-10 MED ORDER — INSULIN ASPART 100 UNIT/ML IJ SOLN
0.0000 [IU] | Freq: Three times a day (TID) | INTRAMUSCULAR | Status: DC
  Administered 2023-01-10 – 2023-01-11 (×4): 2 [IU] via SUBCUTANEOUS
  Administered 2023-01-11 (×2): 1 [IU] via SUBCUTANEOUS
  Administered 2023-01-12: 3 [IU] via SUBCUTANEOUS

## 2023-01-10 MED ORDER — IOHEXOL 350 MG/ML SOLN
75.0000 mL | Freq: Once | INTRAVENOUS | Status: AC | PRN
  Administered 2023-01-10: 75 mL via INTRAVENOUS

## 2023-01-10 MED ORDER — MELATONIN 3 MG PO TABS: 3.0000 mg | ORAL_TABLET | Freq: Every evening | ORAL | Status: DC | PRN

## 2023-01-10 MED ORDER — ACETAMINOPHEN 650 MG RE SUPP: 650.0000 mg | Freq: Four times a day (QID) | RECTAL | Status: DC | PRN

## 2023-01-10 MED ORDER — ATORVASTATIN CALCIUM 10 MG PO TABS
20.0000 mg | ORAL_TABLET | Freq: Every day | ORAL | Status: DC
  Administered 2023-01-10 – 2023-01-11 (×2): 20 mg via ORAL
  Filled 2023-01-10 (×2): qty 2

## 2023-01-10 NOTE — Progress Notes (Signed)
VASCULAR LAB    Bilateral lower extremity venous duplex has been performed.  See CV proc for preliminary results.   Selena Swaminathan, RVT 01/10/2023, 3:16 PM

## 2023-01-10 NOTE — Assessment & Plan Note (Signed)
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  She denies any left sided arm/hand complaints Consider outpatient f/u

## 2023-01-10 NOTE — Progress Notes (Addendum)
PHARMACY - ANTICOAGULATION CONSULT NOTE  Pharmacy Consult for heparin Indication: chest pain/ACS  Allergies  Allergen Reactions   Penicillins Anaphylaxis   Vancomycin Rash    Patient Measurements:   Heparin Dosing Weight: 70kg  Vital Signs: Temp: 98.4 F (36.9 C) (10/20 0248) Temp Source: Oral (10/20 0248) BP: 93/68 (10/20 0248) Pulse Rate: 94 (10/20 0248)  Labs: Recent Labs    01/09/23 2330 01/10/23 0149  HGB 15.5*  --   HCT 46.7*  --   PLT 80*  --   CREATININE 0.81  --   TROPONINIHS 74* 405*    CrCl cannot be calculated (Unknown ideal weight.).   Medical History: Past Medical History:  Diagnosis Date   Anginal pain (HCC)    "went to ED-complete work up and everything was fine"   Anxiety    Borderline diabetes    Carotid artery disease (HCC)    a. 09/2011 Carotid U/S: Eccentric plaque bilat carotid bulbs & origin of bilat ICA's->50-69% on right with borderline elvation of velocities on left.  Degree of narrowing appeared more severe than acquired by velocities.   Carpal tunnel syndrome    Chest pain    a. admx with CP 11/13 => Lexiscan MV 01/25/12:  No ischemia, EF 72%.     Colon polyps    Depression    Diabetes mellitus without complication (HCC)    Ejection Fraction    a. Echo 01/25/12: mild LVH, EF 55-60%, Gr 1 diast dysfn.    Frequent falls    GERD (gastroesophageal reflux disease)    History of kidney stones    none since age 54   Hypercholesteremia    a. Treated x 5 yrs    Hypertension    a. Treated x 10-15 yrs   Memory difficulty    OCD (obsessive compulsive disorder)    OSA on CPAP    Osteoarthritis    a. s/p R TKA 2003;  b. s/p L TKA 2007.   Vertigo       Assessment: 82 yoF s/p syncope with elevated trops, pharmacy to dose IV heparin for ACS r/o. Historical wts ~70kg. No AC PTA, H/H wnl. Pltc low. CT head normal.  Goal of Therapy:  Heparin level 0.3-0.7 units/ml Monitor platelets by anticoagulation protocol: Yes   Plan:  -Heparin  4000 units x1 -Heparin 900 units/hr -Check 8-hr heparin level -Monitor heparin level, CBC, S/Sx bleeding daily  Fredonia Highland, PharmD, BCPS, [facility] Clinical Pharmacist Please check AMION for all [facility] Pharmacy numbers 01/10/2023

## 2023-01-10 NOTE — ED Notes (Signed)
RN and NT changed pt soiled brief and chucks.

## 2023-01-10 NOTE — ED Notes (Signed)
RN adjusted pt BP cuff.

## 2023-01-10 NOTE — ED Notes (Signed)
ED TO INPATIENT HANDOFF REPORT  ED Nurse Name and Phone #: Jess Barters [phone]  S Name/Age/Gender Tiffany Burke 82 y.o. female Room/Bed: 032C/032C  Code Status   Code Status: Full Code  Home/SNF/Other Home Patient oriented to: self and place Is this baseline? Yes   Triage Complete: Triage complete  Chief Complaint Acute pulmonary embolism (HCC) [I26.99]  Triage Note Pt here from home with c/o a near syncopal episode while walking down some steps husband was able to lower pt to ground , unknown loc ,dementia at baseline    Allergies Allergies  Allergen Reactions   Penicillins Anaphylaxis   Vancomycin Rash    Level of Care/Admitting Diagnosis ED Disposition     ED Disposition  Admit   Condition  --   Comment  Hospital Area: [facility] [[id number]]  Level of Care: Progressive [102]  Admit to Progressive based on following criteria: MULTISYSTEM THREATS such as stable sepsis, metabolic/electrolyte imbalance with or without encephalopathy that is responding to early treatment.  May admit patient to [facility] or [facility] if equivalent level of care is available:: No  Covid Evaluation: Asymptomatic - no recent exposure (last 10 days) testing not required  Diagnosis: Acute pulmonary embolism [facility]) [[id number]]  Admitting Physician: Angie Fava [[id number]]  Attending Physician: Angie Fava [[id number]]  Certification:: I certify this patient will need inpatient services for at least 2 midnights  Expected Medical Readiness: 01/12/2023          B Medical/Surgery History Past Medical History:  Diagnosis Date   Anginal pain (HCC)    "went to ED-complete work up and everything was fine"   Anxiety    Borderline diabetes    Carotid artery disease (HCC)    a. 09/2011 Carotid U/S: Eccentric plaque bilat carotid bulbs & origin of bilat ICA's->50-69% on right with borderline elvation of velocities on left.  Degree of narrowing  appeared more severe than acquired by velocities.   Carpal tunnel syndrome    Chest pain    a. admx with CP 11/13 => Lexiscan MV 01/25/12:  No ischemia, EF 72%.     Colon polyps    Depression    Diabetes mellitus without complication (HCC)    Ejection Fraction    a. Echo 01/25/12: mild LVH, EF 55-60%, Gr 1 diast dysfn.    Frequent falls    GERD (gastroesophageal reflux disease)    History of kidney stones    none since age 25   Hypercholesteremia    a. Treated x 5 yrs    Hypertension    a. Treated x 10-15 yrs   Memory difficulty    OCD (obsessive compulsive disorder)    OSA on CPAP    Osteoarthritis    a. s/p R TKA 2003;  b. s/p L TKA 2007.   Vertigo    Past Surgical History:  Procedure Laterality Date   BREAST EXCISIONAL BIOPSY Left 2005   CATARACT EXTRACTION Bilateral 2007   CYSTOSCOPY     "several"   ESOPHAGEAL MANOMETRY N/A 10/23/2015   Procedure: ESOPHAGEAL MANOMETRY (EM);  Surgeon: Willis Modena, MD;  Location: [facility];  Service: Endoscopy;  Laterality: N/A;   KIDNEY STONE SURGERY  1965   x1   Left Knee Replacement     b. 2007   NASAL SEPTUM SURGERY  1994   Right Knee Replacement     a. 2003   TOE SURGERY Left 2002   big toe   TOTAL KNEE REVISION Right  03/28/2014   Procedure: RIGHT KNEE POLYETHYLENE REVISION WITH FEMORAL BONE GRAFTING;  Surgeon: Loanne Drilling, MD;  Location: [facility] ORS;  Service: Orthopedics;  Laterality: Right;     A IV Location/Drains/Wounds Patient Lines/Drains/Airways Status     Active Line/Drains/Airways     Name Placement date Placement time Site Days   Peripheral IV 01/10/23 20 G 1" Right;Medial Forearm 01/10/23  0435  Forearm  less than 1   Incision (Closed) 03/28/14 Leg Right 03/28/14  1342  -- 3210            Intake/Output Last 24 hours No intake or output data in the 24 hours ending 01/10/23 1514  Labs/Imaging Results for orders placed or performed during the hospital encounter of 01/09/23 (from the past 48 hour(s))   CBG monitoring, ED     Status: Abnormal   Collection Time: 01/09/23  8:24 PM  Result Value Ref Range   Glucose-Capillary 255 (H) 70 - 99 mg/dL    Comment: Glucose reference range applies only to samples taken after fasting for at least 8 hours.   Comment 1 Notify RN    Comment 2 Document in Chart   Urinalysis, Routine w reflex microscopic -Urine, Clean Catch     Status: Abnormal   Collection Time: 01/09/23 11:30 PM  Result Value Ref Range   Color, Urine YELLOW YELLOW   APPearance CLEAR CLEAR   Specific Gravity, Urine 1.021 1.005 - 1.030   pH 5.0 5.0 - 8.0   Glucose, UA >=500 (A) NEGATIVE mg/dL   Hgb urine dipstick NEGATIVE NEGATIVE   Bilirubin Urine NEGATIVE NEGATIVE   Ketones, ur 5 (A) NEGATIVE mg/dL   Protein, ur NEGATIVE NEGATIVE mg/dL   Nitrite NEGATIVE NEGATIVE   Leukocytes,Ua NEGATIVE NEGATIVE   RBC / HPF 0-5 0 - 5 RBC/hpf   WBC, UA 0-5 0 - 5 WBC/hpf   Bacteria, UA RARE (A) NONE SEEN   Squamous Epithelial / HPF 0-5 0 - 5 /HPF   Mucus PRESENT     Comment: Performed at [facility], [address], [place], [place] [address]  CBC with Differential     Status: Abnormal   Collection Time: 01/09/23 11:30 PM  Result Value Ref Range   WBC 8.9 4.0 - 10.5 K/uL   RBC 4.89 3.87 - 5.11 MIL/uL   Hemoglobin 15.5 (H) 12.0 - 15.0 g/dL   HCT 25.9 (H) 56.3 - 87.5 %   MCV 95.5 80.0 - 100.0 fL   MCH 31.7 26.0 - 34.0 pg   MCHC 33.2 30.0 - 36.0 g/dL   RDW 64.3 32.9 - 51.8 %   Platelets 80 (L) 150 - 400 K/uL    Comment: SPECIMEN CHECKED FOR CLOTS Immature Platelet Fraction may be clinically indicated, consider ordering this additional test [id number] REPEATED TO VERIFY    nRBC 0.0 0.0 - 0.2 %   Neutrophils Relative % 76 %   Neutro Abs 6.8 1.7 - 7.7 K/uL   Lymphocytes Relative 12 %   Lymphs Abs 1.0 0.7 - 4.0 K/uL   Monocytes Relative 11 %   Monocytes Absolute 0.9 0.1 - 1.0 K/uL   Eosinophils Relative 0 %   Eosinophils Absolute 0.0 0.0 - 0.5 K/uL   Basophils Relative 1 %    Basophils Absolute 0.0 0.0 - 0.1 K/uL   Immature Granulocytes 0 %   Abs Immature Granulocytes 0.03 0.00 - 0.07 K/uL    Comment: Performed at [facility], [address], [place], [place] [address]  Comprehensive metabolic panel     Status: Abnormal   Collection Time: 01/09/23 11:30 PM  Result Value Ref Range   Sodium 139 135 - 145 mmol/L   Potassium 4.4 3.5 - 5.1 mmol/L   Chloride 102 98 - 111 mmol/L   CO2 21 (L) 22 - 32 mmol/L   Glucose, Bld 243 (H) 70 - 99 mg/dL    Comment: Glucose reference range applies only to samples taken after fasting for at least 8 hours.   BUN 18 8 - 23 mg/dL   Creatinine, Ser 1.61 0.44 - 1.00 mg/dL   Calcium 09.6 8.9 - 04.5 mg/dL   Total Protein 7.5 6.5 - 8.1 g/dL   Albumin 4.2 3.5 - 5.0 g/dL   AST 39 15 - 41 U/L   ALT 50 (H) 0 - 44 U/L   Alkaline Phosphatase 97 38 - 126 U/L   Total Bilirubin 1.5 (H) 0.3 - 1.2 mg/dL   GFR, Estimated >40 >98 mL/min    Comment: (NOTE) Calculated using the CKD-EPI Creatinine Equation (2021)    Anion gap 16 (H) 5 - 15    Comment: Performed at [facility], [address], [place], [place] [address]  Troponin I (High Sensitivity)     Status: Abnormal   Collection Time: 01/09/23 11:30 PM  Result Value Ref Range   Troponin I (High Sensitivity) 74 (H) <18 ng/L    Comment: (NOTE) Elevated high sensitivity troponin I (hsTnI) values and significant  changes across serial measurements may suggest ACS but many other  chronic and acute conditions are known to elevate hsTnI results.  Refer to the "Links" section for chest pain algorithms and additional  guidance. Performed at [facility], [address], [place], [place] [address]   Troponin I (High Sensitivity)     Status: Abnormal   Collection Time: 01/10/23  1:49 AM  Result Value Ref Range   Troponin I (High Sensitivity) 405 (HH) <18 ng/L    Comment: CRITICAL RESULT CALLED TO, READ BACK BY AND VERIFIED WITH LAMBERT,M RN [phone] 01/10/23 AMIREHSANI  F (NOTE) Elevated high sensitivity troponin I (hsTnI) values and significant  changes across serial measurements may suggest ACS but many other  chronic and acute conditions are known to elevate hsTnI results.  Refer to the "Links" section for chest pain algorithms and additional  guidance. Performed at [facility], [address] [place], [place] [address]   Brain natriuretic peptide     Status: Abnormal   Collection Time: 01/10/23  4:34 AM  Result Value Ref Range   B Natriuretic Peptide 169.3 (H) 0.0 - 100.0 pg/mL    Comment: Performed at [facility], [address], [place], [place] [address]  CBC     Status: Abnormal   Collection Time: 01/10/23  4:34 AM  Result Value Ref Range   WBC 6.7 4.0 - 10.5 K/uL   RBC 4.32 3.87 - 5.11 MIL/uL   Hemoglobin 13.8 12.0 - 15.0 g/dL   HCT 29.5 28.4 - 13.2 %   MCV 93.1 80.0 - 100.0 fL   MCH 31.9 26.0 - 34.0 pg   MCHC 34.3 30.0 - 36.0 g/dL   RDW 44.0 10.2 - 72.5 %   Platelets 80 (L) 150 - 400 K/uL    Comment: Immature Platelet Fraction may be clinically indicated, consider ordering this additional test [id number] REPEATED TO VERIFY    nRBC 0.0 0.0 - 0.2 %    Comment: Performed at [facility], [address], [place], [place] [address]  I-Stat Lactic Acid     Status: None   Collection Time: 01/10/23  4:44 AM  Result Value Ref Range   Lactic Acid, Venous 1.3 0.5 - 1.9 mmol/L  Comprehensive metabolic panel     Status: Abnormal   Collection Time: 01/10/23  4:56 AM  Result Value Ref Range   Sodium 138 135 - 145 mmol/L   Potassium 4.3 3.5 - 5.1 mmol/L   Chloride 101 98 - 111 mmol/L   CO2 24 22 - 32 mmol/L   Glucose, Bld 165 (H) 70 - 99 mg/dL    Comment: Glucose reference range applies only to samples taken after fasting for at least 8 hours.   BUN 17 8 - 23 mg/dL   Creatinine, Ser 9.14 0.44 - 1.00 mg/dL   Calcium 78.2 8.9 - 95.6 mg/dL   Total Protein 6.3 (L) 6.5 - 8.1 g/dL   Albumin 3.4 (L) 3.5 - 5.0 g/dL   AST 27  15 - 41 U/L   ALT 39 0 - 44 U/L   Alkaline Phosphatase 79 38 - 126 U/L   Total Bilirubin 1.7 (H) 0.3 - 1.2 mg/dL   GFR, Estimated >21 >30 mL/min    Comment: (NOTE) Calculated using the CKD-EPI Creatinine Equation (2021)    Anion gap 13 5 - 15    Comment: Performed at [facility] Lab, [address] [place], [place] [address]  Magnesium     Status: None   Collection Time: 01/10/23  4:56 AM  Result Value Ref Range   Magnesium 2.1 1.7 - 2.4 mg/dL    Comment: Performed at [facility] Lab, [address], [place], [place] [address]  APTT     Status: Abnormal   Collection Time: 01/10/23  5:04 AM  Result Value Ref Range   aPTT >200 (HH) 24 - 36 seconds    Comment:        IF BASELINE aPTT IS ELEVATED, SUGGEST PATIENT RISK ASSESSMENT BE USED TO DETERMINE APPROPRIATE ANTICOAGULANT THERAPY. REPEATED TO VERIFY CRITICAL RESULT CALLED TO, READ BACK BY AND VERIFIED WITH: Parke Poisson RN BY [facility] AT [id number] Performed at [facility] Lab, [address], [place], [place] [address]   Protime-INR     Status: Abnormal   Collection Time: 01/10/23  5:04 AM  Result Value Ref Range   Prothrombin Time 15.8 (H) 11.4 - 15.2 seconds   INR 1.2 0.8 - 1.2    Comment: (NOTE) INR goal varies based on device and disease states. Performed at [facility], [address], [place], [place] [address]   Lactic acid, plasma     Status: None   Collection Time: 01/10/23  5:07 AM  Result Value Ref Range   Lactic Acid, Venous 1.3 0.5 - 1.9 mmol/L    Comment: Performed at [facility] Lab, [address] [place], [place] [address]  CBG monitoring, ED     Status: Abnormal   Collection Time: 01/10/23  8:28 AM  Result Value Ref Range   Glucose-Capillary 162 (H) 70 - 99 mg/dL    Comment: Glucose reference range applies only to samples taken after fasting for at least 8 hours.  Lactic acid, plasma     Status: None   Collection Time: 01/10/23  8:45 AM  Result Value Ref Range   Lactic  Acid, Venous 1.5 0.5 - 1.9 mmol/L    Comment: Performed at [facility]  Lab, [address], [place], [place] [address]  Troponin I (High Sensitivity)     Status: Abnormal   Collection Time: 01/10/23  8:45 AM  Result Value Ref Range   Troponin I (High Sensitivity) 379 (HH) <18 ng/L    Comment: CRITICAL VALUE NOTED. VALUE IS CONSISTENT WITH PREVIOUSLY REPORTED/CALLED VALUE (NOTE) Elevated high sensitivity troponin I (hsTnI) values and significant  changes across serial measurements may suggest ACS but many other  chronic and acute conditions are known to elevate hsTnI results.  Refer to the "Links" section for chest pain algorithms and additional  guidance. Performed at [facility] Lab, [address] [place], [place] [address]   Hemoglobin A1c     Status: Abnormal   Collection Time: 01/10/23  8:45 AM  Result Value Ref Range   Hgb A1c MFr Bld 8.1 (H) 4.8 - 5.6 %    Comment: (NOTE) Pre diabetes:          5.7%-6.4%  Diabetes:              >6.4%  Glycemic control for   <7.0% adults with diabetes    Mean Plasma Glucose 185.77 mg/dL    Comment: Performed at [facility], [address] [place], [place] [address]  APTT     Status: Abnormal   Collection Time: 01/10/23  9:56 AM  Result Value Ref Range   aPTT 174 (HH) 24 - 36 seconds    Comment:        IF BASELINE aPTT IS ELEVATED, SUGGEST PATIENT RISK ASSESSMENT BE USED TO DETERMINE APPROPRIATE ANTICOAGULANT THERAPY. REPEATED TO VERIFY CRITICAL RESULT CALLED TO, READ BACK BY AND VERIFIED WITH: Jess Barters RN BY [facility] AT [id number] Performed at [facility], [address], [place], [place] [address]   Magnesium     Status: None   Collection Time: 01/10/23  9:56 AM  Result Value Ref Range   Magnesium 2.1 1.7 - 2.4 mg/dL    Comment: Performed at [facility] Lab, [address], [place], [place] [address]  CBG monitoring, ED     Status: Abnormal   Collection Time: 01/10/23 12:22 PM  Result  Value Ref Range   Glucose-Capillary 188 (H) 70 - 99 mg/dL    Comment: Glucose reference range applies only to samples taken after fasting for at least 8 hours.  Heparin level (unfractionated)     Status: Abnormal   Collection Time: 01/10/23  1:09 PM  Result Value Ref Range   Heparin Unfractionated 0.97 (H) 0.30 - 0.70 IU/mL    Comment: (NOTE) The clinical reportable range upper limit is being lowered to >1.10 to align with the FDA approved guidance for the current laboratory assay.  If heparin results are below expected values, and patient dosage has  been confirmed, suggest follow up testing of antithrombin III levels. Performed at [facility] Lab, [address], [place], [place] [address]    CT Angio Chest PE W and/or Wo Contrast  Result Date: 01/10/2023 CLINICAL DATA:  Near syncopal episode, high probability of pulmonary embolism. EXAM: CT ANGIOGRAPHY CHEST WITH CONTRAST TECHNIQUE: Multidetector CT imaging of the chest was performed using the standard protocol during bolus administration of intravenous contrast. Multiplanar CT image reconstructions and MIPs were obtained to evaluate the vascular anatomy. RADIATION DOSE REDUCTION: This exam was performed according to the departmental dose-optimization program which includes automated exposure control, adjustment of the mA and/or kV according to patient size and/or use of iterative  reconstruction technique. CONTRAST:  75mL OMNIPAQUE IOHEXOL 350 MG/ML SOLN COMPARISON:  Portable chest yesterday, portable chest 03/26/2021. No prior chest CT. FINDINGS: Cardiovascular: In general there is a large arterial embolic clot burden affecting both lungs and associated with right heart strain with RV/LV ratio of 1.4. The pulmonary trunk is upper normal in caliber. There is a saddle thrombus straddling the division of the right and left main pulmonary arteries. On the right, acute clot nearly fills the distal main artery, with linear thrombus extending  into the right upper lobe lobar and segmental arteries, with occlusive thrombus in the right lower lobe main artery extending into multiple segmental and subsegmental arteries. There is nonocclusive right middle lobe lobar and segmental arterial thrombus. On the left, clot nearly occludes the upper lobe apical segmental artery with extension into at least 1 subsegmental branch, extends into the upper lobe anterior segmental artery and lingular segmental artery, and almost completely occludes the lower lobe main artery with extension into multiple basal segmental and subsegmental branches. There is mild-to-moderate cardiomegaly with a right chamber predominance. No visible coronary calcifications or substantial pericardial fluid. Moderate to heavy calcification noted in the aorta in the distal arch and descending segment, with aortic tortuosity without aneurysm or dissection. The great vessels branch normally. The proximal 1.8 cm of the left subclavian artery is heavily calcified and is either very severely stenotic or occluded, predisposing to subclavian steal syndrome warranting clinical correlation. The great vessels do not show significant narrowing. The pulmonary veins are normal in caliber. Mediastinum/Nodes: Small hiatal hernia. Mild generalized thickening of the thoracic esophagus consistent with esophagitis. Endoscopy may be indicated. Thyroid gland and axillary spaces are unremarkable. There is no intrathoracic adenopathy. The thoracic trachea and main bronchi are clear. Lungs/Pleura: There are trace pleural effusions. No pneumothorax. No pleural thickening. Bilateral bronchial thickening noted in the lower lobes, in the left lower lobe with scattered subsegmental bronchial plugging. There is mild posterior atelectasis in the lungs but no infiltrates or nodules. Upper Abdomen: Cirrhotic liver configuration. No splenomegaly. No acute upper abdominal findings. Musculoskeletal: There is mild anterior wedging  of the T1 and T4 vertebral bodies with chronic appearance. At T12, a partially visible moderate to severe upper plate compression fracture is noted, age indeterminate but there is no adjacent soft tissue thickening or fluid. This was not present on a lumbar spine plain film study of 07/10/2020, which is the last file study which included this area. There is about 4 mm retropulsion of the posterosuperior T12 cortex into the spinal canal, narrowing the AP thecal sac to 8 mm. I believe this is probably subacute or chronic based on the gross appearance. Review of the MIP images confirms the above findings. IMPRESSION: 1. Positive for large volume acute PE with CT evidence of right heart strain (RV/LV Ratio = 1.4 ) consistent with at least submassive (intermediate risk) PE. The presence of right heart strain has been associated with an increased risk of morbidity and mortality. Please refer to the "Code PE Focused" order set in EPIC. 2. Cardiomegaly with right chamber predominance. 3. Trace pleural effusions. 4. Bilateral lower lobe bronchitis with scattered subsegmental bronchial plugging left lower lobe. 5. Aortic atherosclerosis. 6. 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 7. Cirrhotic liver configuration. 8. Small hiatal hernia with generalized thickening of the thoracic esophagus consistent with esophagitis. Endoscopy may be indicated. 9. T12 partially visible moderate to severe upper plate compression fracture, age indeterminate  but I believe this is probably subacute or chronic. There is about 4 mm retropulsion of the posterosuperior cortex into the spinal canal narrowing the AP thecal sac to 8 mm. 10. Critical Value/emergent results were called by telephone at the time of interpretation on 01/10/2023 at 4:36 am to provider [facility] , who verbally acknowledged these results. Aortic Atherosclerosis  (ICD10-I70.0). Electronically Signed   By: Almira Bar M.D.   On: 01/10/2023 04:47   CT Head Wo Contrast  Result Date: 01/10/2023 CLINICAL DATA:  Near syncopal episode EXAM: CT HEAD WITHOUT CONTRAST TECHNIQUE: Contiguous axial images were obtained from the base of the skull through the vertex without intravenous contrast. RADIATION DOSE REDUCTION: This exam was performed according to the departmental dose-optimization program which includes automated exposure control, adjustment of the mA and/or kV according to patient size and/or use of iterative reconstruction technique. COMPARISON:  01/14/2022 FINDINGS: Brain: No evidence of acute infarction, hemorrhage, mass, mass effect, or midline shift. No hydrocephalus or extra-axial fluid collection. Periventricular white matter changes, likely the sequela of chronic small vessel ischemic disease. Cerebral atrophy with ex vacuo dilatation the ventricles. Vascular: No hyperdense vessel. Atherosclerotic calcifications in the intracranial carotid and vertebral arteries. Skull: Negative for fracture or focal lesion. Sinuses/Orbits: No acute finding. Status post bilateral lens replacements. Other: The mastoid air cells are well aerated. IMPRESSION: No acute intracranial process. Electronically Signed   By: Wiliam Ke M.D.   On: 01/10/2023 02:05   DG Chest Port 1 View  Result Date: 01/09/2023 CLINICAL DATA:  Weakness and fatigue. EXAM: PORTABLE CHEST 1 VIEW COMPARISON:  03/26/2021 FINDINGS: The heart is mildly enlarged. Unchanged mediastinal contours. Aortic atherosclerosis. Suspect small left pleural effusion. Streaky right infrahilar atelectasis. No pneumothorax. No pulmonary edema. IMPRESSION: Mild cardiomegaly. Suspect small left pleural effusion. Streaky right infrahilar atelectasis. Electronically Signed   By: Narda Rutherford M.D.   On: 01/09/2023 23:53    Pending Labs Unresulted Labs (From admission, onward)     Start     Ordered   01/11/23 0500   Heparin level (unfractionated)  Daily,   R      01/10/23 0348   01/11/23 0500  Basic metabolic panel  Tomorrow morning,   R        01/10/23 0814   01/10/23 2200  Heparin level (unfractionated)  Once-Timed,   TIMED        01/10/23 1358   01/10/23 0500  CBC  Daily,   R      01/10/23 0348            Vitals/Pain Today's Vitals   01/10/23 1100 01/10/23 1223 01/10/23 1230 01/10/23 1300  BP: 113/64  (!) 105/59   Pulse: 85  86   Resp: 16  20   Temp:  98.4 F (36.9 C)    TempSrc:  Oral    SpO2: 96%  97%   Weight:    75 kg  Height:    5\' 2"  (1.575 m)  PainSc:        Isolation Precautions No active isolations  Medications Medications  heparin ADULT infusion 100 units/mL (25000 units/221mL) (750 Units/hr Intravenous Rate/Dose Change 01/10/23 1413)  acetaminophen (TYLENOL) tablet 650 mg (has no administration in time range)    Or  acetaminophen (TYLENOL) suppository 650 mg (has no administration in time range)  melatonin tablet 3 mg (has no administration in time range)  ondansetron (ZOFRAN) injection 4 mg (has no administration in time range)  lactated ringers infusion (0 mLs Intravenous  Stopped 01/10/23 1307)  insulin aspart (novoLOG) injection 0-9 Units (2 Units Subcutaneous Given 01/10/23 1307)  docusate sodium (COLACE) capsule 100 mg (has no administration in time range)  heparin bolus via infusion 4,000 Units (4,000 Units Intravenous Bolus from Bag 01/10/23 0439)  iohexol (OMNIPAQUE) 350 MG/ML injection 75 mL (75 mLs Intravenous Contrast Given 01/10/23 0420)    Mobility non-ambulatory     Focused Assessments    R Recommendations: See Admitting Provider Note  Report given to:   Additional Notes: Pt husband does all ADL's for pt. He is at bedside.

## 2023-01-10 NOTE — Consult Note (Signed)
Case of an 82 year old female patient with past medical history of dementia, hypertension and hyperlipidemia presenting to [facility] after a fall.  She was found to have submassive PE and therefore pulmonary team is being consulted for help with further management.  History was taken from husband was at bedside given patient advanced dementia.  He stated that they were coming out of the car noted that her legs failed her and she fell back however he was able to hold her and drop her slowly to the ground.  She did not hit her head.  She did complain to him for few days of fatigue but otherwise no complaints.  He therefore brought her into the emergency department.  In the ED, she was HD stable, labs were remarkable for thrombocytopenia with a platelet of 80 from a baseline of 140.  CTA of the chest was obtained which showed filling defect in the right main pulmonary artery extending to the segmental branches as well as left PA filling defect extending to the upper and lower segmental branches. It also showed RV strain with a ratio of 1:1.4.  Troponin was elevated at 405 and BNP elevated at 169.3.  Lactate by i-STAT was 1.3.  She was started on heparin drip.  Denies any recent weight changes. Denies any changes in appetite. No history of cancer.   On my encounter she is hemodynamically stable able to vocalize and answer questions however confused at times.  Family history -no family history of blood clots.  Physical Exam  GEN NAD  HEENT Supple neck, reactive pupils, EOMI  CVS Normal S1, Normal S2, tachycardic, sinus, no murmurs appreciated  Lungs: Clear bilateral air entry  Abdomen Soft non tender non distended. + BS  Extremities trace lower extremities edema.   Labs and imaging were reviewed.   Assessment and Plan  Case of an 82 year old female patient with past medical history of dementia, hypertension and hyperlipidemia presenting to [facility] after a fall.  She was found  to have submassive PE and therefore pulmonary team is being consulted for help with further management.  #Intermediate high risk PE with Elevated trop/BNP and CT w/ signs of RV strain. Bedside echo with enlarged RV. She is hemodynamically stable.   She is presenting with intermediate high risk PE with moderate to severe clot burden on CTA of the chest.  However she seems to be perfusing well based on her clinical exam and i-STAT lactate of 1.3.  one can consider mechanical thrombectomy however data is strongest in patients with hemodynamic instability.  Given her age, advanced dementia and comorbidities and risks of mechanical thrombectomy I believe should be more conservative.  However if she shows any signs of hypoperfusion depending on her repeat lactate or any signs of worsening hemodynamics with worsening tachycardia or hypotension we can reconsider mechanical thrombectomy.  At this point agree with systemic anticoagulation.  []  Lactic acid pending  []  Please obtain official echocardiogram  []  Agree with heparin drip []  Hold off on Mechanical thrombectomy at this time pending repeat lactate and echocardiogram.  []  Outpatient work-up for malignancy and other causes behind this event.   I spent 60 minutes caring for this patient today, including preparing to see the patient, obtaining a medical history , reviewing a separately obtained history, performing a medically appropriate examination and/or evaluation, counseling and educating the patient/family/caregiver, ordering medications, tests, or procedures, documenting clinical information in the electronic health record, and independently interpreting results (not separately reported/billed) and communicating  results to the patient/family/caregiver  Janann Colonel, MD [place] 01/10/2023 6:04 AM

## 2023-01-10 NOTE — Assessment & Plan Note (Signed)
Continue namenda Delirium precautions

## 2023-01-10 NOTE — ED Notes (Signed)
Echo tech at bedside.

## 2023-01-10 NOTE — Assessment & Plan Note (Signed)
No longer on CPAP. Husband state she hasn't needed this in years

## 2023-01-10 NOTE — ED Notes (Signed)
Pt spouse assisting with lunch tray.

## 2023-01-10 NOTE — Assessment & Plan Note (Signed)
Soft bp in setting of submassive PE, hold losartan

## 2023-01-10 NOTE — Assessment & Plan Note (Addendum)
Likely combination of weakness/memory loss She does have soft bp and could be in setting of submassive PE, but on farxiga and losartan Check orthostatics/hold farxiga  TED hose  PT eval

## 2023-01-10 NOTE — ED Notes (Signed)
Pt states she doesn't walk at home d/t steps and uses a wheelchair at home.

## 2023-01-10 NOTE — ED Notes (Signed)
Pt spouse inquiring about breakfast tray. NS f/u with cafeteria.   Pt diet order was changed and the cafeteria had to re-order food.

## 2023-01-10 NOTE — Assessment & Plan Note (Signed)
Husband states she has no diagnosis of dementia.  She needs full assist with ADLs  ? Early dementia with frequent falls Continue namenda/delirium precautions

## 2023-01-10 NOTE — Assessment & Plan Note (Addendum)
82 year old female presenting to ED with fatigue, weakness, fall and loss of consciousness found to have submassive PE  -admit to progressive -continue heparin gtt -pulm consulted: continue conservative management since hemodynamically stable, but if becomes hypotensive/unstable they discussed thrombectomy with family  -right leg with edema, check DVT studies -echo pending  -troponin elevated, bnp mild bump  -continue IVF  -no family hx of clotting disorders, no recent travel, does not smoke. Recent covid vaccines in last 4 weeks  -discussed anticoagulation with her husband and fall risk. She has frequent falls at home-PT consult

## 2023-01-10 NOTE — Assessment & Plan Note (Signed)
Secondary to PE/prolonged PTT Follow closely

## 2023-01-10 NOTE — Assessment & Plan Note (Signed)
Continue lipitor 20mg daily  

## 2023-01-10 NOTE — Progress Notes (Signed)
PHARMACY - ANTICOAGULATION CONSULT NOTE  Pharmacy Consult for heparin Indication: chest pain/ACS >> PE w/ RHS   Allergies  Allergen Reactions   Penicillins Anaphylaxis   Vancomycin Rash    Patient Measurements:   Heparin Dosing Weight: 66.3kg   Vital Signs: Temp: 98.4 F (36.9 C) (10/20 1223) Temp Source: Oral (10/20 1223) BP: 105/59 (10/20 1230) Pulse Rate: 86 (10/20 1230)  Labs: Recent Labs    01/09/23 2330 01/10/23 0149 01/10/23 0434 01/10/23 0456 01/10/23 0504 01/10/23 0845 01/10/23 0956 01/10/23 1309  HGB 15.5*  --  13.8  --   --   --   --   --   HCT 46.7*  --  40.2  --   --   --   --   --   PLT 80*  --  80*  --   --   --   --   --   APTT  --   --   --   --  >200*  --  174*  --   LABPROT  --   --   --   --  15.8*  --   --   --   INR  --   --   --   --  1.2  --   --   --   HEPARINUNFRC  --   --   --   --   --   --   --  0.97*  CREATININE 0.81  --   --  0.79  --   --   --   --   TROPONINIHS 74* 405*  --   --   --  379*  --   --     CrCl cannot be calculated (Unknown ideal weight.).   Medical History: Past Medical History:  Diagnosis Date   Anginal pain (HCC)    "went to ED-complete work up and everything was fine"   Anxiety    Borderline diabetes    Carotid artery disease (HCC)    a. 09/2011 Carotid U/S: Eccentric plaque bilat carotid bulbs & origin of bilat ICA's->50-69% on right with borderline elvation of velocities on left.  Degree of narrowing appeared more severe than acquired by velocities.   Carpal tunnel syndrome    Chest pain    a. admx with CP 11/13 => Lexiscan MV 01/25/12:  No ischemia, EF 72%.     Colon polyps    Depression    Diabetes mellitus without complication (HCC)    Ejection Fraction    a. Echo 01/25/12: mild LVH, EF 55-60%, Gr 1 diast dysfn.    Frequent falls    GERD (gastroesophageal reflux disease)    History of kidney stones    none since age 34   Hypercholesteremia    a. Treated x 5 yrs    Hypertension    a. Treated x  10-15 yrs   Memory difficulty    OCD (obsessive compulsive disorder)    OSA on CPAP    Osteoarthritis    a. s/p R TKA 2003;  b. s/p L TKA 2007.   Vertigo       Assessment: 83 yoF s/p syncope with elevated trops, pharmacy to dose IV heparin for ACS r/o. Historical wts ~70kg. No AC PTA, H/H wnl. Pltc low. CT head normal. CTA showing PE with RHS. Noted that patient pulled out IV at 12:30, unlikely significant time paused. PLTs 80 and HgB 13.8.   Heparin level supratherapeutic at 0.97, confirmed drawn as  stick away from heparin gtt.   Goal of Therapy:  Heparin level 0.3-0.7 units/ml Monitor platelets by anticoagulation protocol: Yes   Plan:  Decrease heparin to 750u/hr.  Check 8-hr heparin level Monitor heparin level, CBC, S/Sx bleeding daily, follow PLTs closely   Estill Batten, PharmD, BCCCP  Clinical Pharmacist Please check AMION for all [facility] Pharmacy numbers 01/10/2023

## 2023-01-10 NOTE — ED Notes (Signed)
Pt spouse stated pt having lower abdominal pain as if she needs to poop. Pt last BM 01/09/23.  RN notified Dr. Artis Flock for recommendations.

## 2023-01-10 NOTE — Assessment & Plan Note (Signed)
Husband states this is not new. She had PT which wasn't very helpful She has no more pain regarding this.

## 2023-01-10 NOTE — ED Provider Notes (Signed)
Physical Exam  BP (!) 108/90   Pulse (!) 103   Temp 98.8 F (37.1 C) (Oral)   Resp 20   SpO2 99%   Physical Exam  Procedures  .Critical Care  Performed by: Darrick Grinder, PA-C Authorized by: Darrick Grinder, PA-C   Critical care provider statement:    Critical care time (minutes):  30   Critical care was necessary to treat or prevent imminent or life-threatening deterioration of the following conditions: Pulmonary embolism with right heart strain.   Critical care was time spent personally by me on the following activities:  Development of treatment plan with patient or surrogate, discussions with consultants, evaluation of patient's response to treatment, examination of patient, ordering and review of laboratory studies, ordering and review of radiographic studies, ordering and performing treatments and interventions, pulse oximetry, re-evaluation of patient's condition and review of old charts   Care discussed with: admitting provider     ED Course / MDM    Medical Decision Making Amount and/or Complexity of Data Reviewed Labs: ordered. Radiology: ordered.  Risk Prescription drug management. Decision regarding hospitalization.   Patient care assumed at shift handoff from Langston Masker, PA-C.  See her note for full details.  In short, 82 year old female patient presented to the emergency department with her husband with concerns over a near syncopal episode.  Patient was going out to dinner with her husband when she looked up and fell backwards, being caught by her husband and not hitting the ground or injuring herself.  She reports not losing consciousness.  The patient has reported dementia and the husband reports that she has very foul-smelling urine at this time.  At time of shift handoff CT head and labs are pending.  Disposition pending results of lab work and head CT  Head CT shows no acute intracranial process.  Lab work remarkable for initial troponin of 74,  repeat 405  I requested consultation with cardiology.  I ordered a CT angio PE study as patient was tachycardic and is hypotensive.  Unclear etiology at this time of patient's elevated troponins this patient does not seem to be in respiratory distress and does not complain of chest pain. CT results include: 1. Positive for large volume acute PE with CT evidence of right heart strain (RV/LV Ratio = 1.4 ) consistent with at least submassive (intermediate risk) PE. The presence of right heart strain has been associated with an increased risk of morbidity and mortality. Please refer to the "Code PE Focused" order set in EPIC. 2. Cardiomegaly with right chamber predominance. 3. Trace pleural effusions. 4. Bilateral lower lobe bronchitis with scattered subsegmental bronchial plugging left lower lobe. 5. Aortic atherosclerosis. 6. 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 7. Cirrhotic liver configuration. 8. Small hiatal hernia with generalized thickening of the thoracic esophagus consistent with esophagitis. Endoscopy may be indicated. 9. T12 partially visible moderate to severe upper plate compression fracture, age indeterminate but I believe this is probably subacute or chronic. There is about 4 mm retropulsion of the posterosuperior cortex into the spinal canal narrowing the AP thecal sac to 8 mm.   Heparin was ordered for PE treatment.  I requested consultation with the hospitalist service.  Dr. Arlean Hopping agrees to see the patient for admission but request that I consult the intensivist service for input on patient's PE/right heart strain management. Dr.Assaker will consult.     Darrick Grinder, [place] 01/10/23 [phone]  Mesner, Barbara Cower, MD 01/10/23 [phone]

## 2023-01-10 NOTE — H&P (Addendum)
History and Physical    Patient: Tiffany Burke OZH:[id number] DOB: [birth year] DOA: 01/09/2023 DOS: the patient was seen and examined on 01/10/2023 PCP: Merri Brunette, MD  Patient coming from: Home - lives with her husband. Uses walker to ambulate.    Chief Complaint: fall/LOC   HPI: Tiffany Burke is a 82 y.o. female with medical history significant of frequent falls, HTN, HLD, carotid artery disease, depression/anxiety, T2DM, GERD, OSA on cpap who presented to ED after a fall. They were headed out to dinner and she was going at the back door. She got one foot down the landing and started falling backwards. She made a gurgling sound as she fell and hit her knees first then bumped her head. He states she lost consciousness, eyes were fluttering and she was gasping for breath.  She didn't regain consciousness until the fireman got there. Husband thinks she was unconsciousness for 10 minutes. Before she fell she was complaining of fatigue.   No recent travel/airplane trips. She did get a covid vaccine 9/17. No trauma, smoking, HRT. No family history of clotting disorders. No recent illness. She has had swelling her feet over the last 6 months time.   Denies any fever/chills, vision changes/headaches, chest pain or palpitations, shortness of breath or cough, abdominal pain, N/V/D, dysuria or leg swelling.   She does not smoke or drink alcohol.   ER Course:  vitals: afebrile, bp: 92/70, HR: 107, RR: 20, oxygen: 94%RA Pertinent labs: platelets 80, ALT: 50, troponin 74>405, BNP: 169, lactic acid 1.3 CT head: no acute finding CTA: large volume acute PE with right heart strain. Cardiomegaly, trace pleural effusions. Bilatearl lower lobe bronchitis. 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 Cirrhotic liver configuration. Small hiatal hernia with thickening of the thoracic  esophagus consistent with esophagitis.  T12 partially visible moderate to severe upper plate compression fracture, age indeterminate but I believe this is probably subacute or chronic. There is about 4 mm retropulsion of the posterosuperior cortex into the spinal canal narrowing the AP thecal sac to 8 mm. In ED: started on heparin gtt, pulm consulted and cardiology consulted.     Review of Systems: As mentioned in the history of present illness. All other systems reviewed and are negative. Past Medical History:  Diagnosis Date   Anginal pain (HCC)    "went to ED-complete work up and everything was fine"   Anxiety    Borderline diabetes    Carotid artery disease (HCC)    a. 09/2011 Carotid U/S: Eccentric plaque bilat carotid bulbs & origin of bilat ICA's->50-69% on right with borderline elvation of velocities on left.  Degree of narrowing appeared more severe than acquired by velocities.   Carpal tunnel syndrome    Chest pain    a. admx with CP 11/13 => Lexiscan MV 01/25/12:  No ischemia, EF 72%.     Colon polyps    Depression    Diabetes mellitus without complication (HCC)    Ejection Fraction    a. Echo 01/25/12: mild LVH, EF 55-60%, Gr 1 diast dysfn.    Frequent falls    GERD (gastroesophageal reflux disease)    History of kidney stones    none since age 64   Hypercholesteremia    a. Treated x 5 yrs    Hypertension    a. Treated x 10-15 yrs   Memory difficulty    OCD (obsessive compulsive disorder)    OSA on  CPAP    Osteoarthritis    a. s/p R TKA 2003;  b. s/p L TKA 2007.   Vertigo    Past Surgical History:  Procedure Laterality Date   BREAST EXCISIONAL BIOPSY Left 2005   CATARACT EXTRACTION Bilateral 2007   CYSTOSCOPY     "several"   ESOPHAGEAL MANOMETRY N/A 10/23/2015   Procedure: ESOPHAGEAL MANOMETRY (EM);  Surgeon: Willis Modena, MD;  Location: [facility];  Service: Endoscopy;  Laterality: N/A;   KIDNEY STONE SURGERY  1965   x1   Left Knee Replacement     b.  2007   NASAL SEPTUM SURGERY  1994   Right Knee Replacement     a. 2003   TOE SURGERY Left 2002   big toe   TOTAL KNEE REVISION Right 03/28/2014   Procedure: RIGHT KNEE POLYETHYLENE REVISION WITH FEMORAL BONE GRAFTING;  Surgeon: Loanne Drilling, MD;  Location: [facility];  Service: Orthopedics;  Laterality: Right;   Social History:  reports that she quit smoking about 30 years ago. Her smoking use included cigarettes. She started smoking about 62 years ago. She has a 16 pack-year smoking history. She has never used smokeless tobacco. She reports that she does not currently use alcohol. She reports that she does not use drugs.  Allergies  Allergen Reactions   Penicillins Anaphylaxis   Vancomycin Rash    Family History  Problem Relation Age of Onset   Heart attack Mother        died @ 81   Hypertension Mother    Diabetes Father    Stroke Father        died @ 36 - multiple strokes.   Other Sister        s/p heart valve replacement   Breast cancer Sister        late 39's   Diabetes Brother        died @ 11 with ESRD   Other Brother        alive/well @ 74    Prior to Admission medications   Medication Sig Start Date End Date Taking? Authorizing Provider  atorvastatin (LIPITOR) 20 MG tablet Take 20 mg by mouth at bedtime.    [provider]  Cholecalciferol (D3-1000 PO) Take 1,000 Units by mouth daily.    [provider]  citalopram (CELEXA) 40 MG tablet Take 40 mg by mouth every morning.  Patient not taking: Reported on 05/28/2021    [provider]  FARXIGA 10 MG TABS tablet Take 10 mg by mouth daily. 11/12/22   [provider]  losartan (COZAAR) 50 MG tablet Take 25 mg by mouth daily. 01/06/21   [provider]  memantine (NAMENDA) 10 MG tablet Take 1 tablet by mouth twice daily 07/28/22   Penumalli, Glenford Bayley, MD  Multiple Vitamins-Minerals (PRESERVISION AREDS 2 PO) Take 1 tablet by mouth in the morning and at bedtime.    [provider]  nitroGLYCERIN (NITROSTAT) 0.4 MG SL tablet Place 0.4 mg under the tongue every 5 (five) minutes as needed for chest pain. Patient not taking: Reported on 05/28/2021 08/02/13   [provider]    Physical Exam: Vitals:   01/10/23 0649 01/10/23 0700 01/10/23 0715 01/10/23 0730  BP: 90/69 91/71 95/67  106/81  Pulse: 89 90 89 88  Resp: 15 (!) 24 19 18   Temp:      TempSrc:      SpO2: 98% 96% 98% 98%   General:  Appears calm and comfortable  and is in NAD Eyes:  PERRL, EOMI, normal lids, iris ENT:  grossly normal hearing, lips & tongue, mmm; appropriate dentition Neck:  no LAD, masses or thyromegaly; no carotid bruits Cardiovascular:  RRR, no m/r/g. Mild RLE edema.  Respiratory:   CTA bilaterally with no wheezes/rales/rhonchi.  Normal respiratory effort. Abdomen:  soft, NT, ND, NABS Back:   normal alignment, no CVAT Skin:  no rash or induration seen on limited exam Musculoskeletal:  grossly normal tone BUE/BLE, good ROM, no bony abnormality Lower extremity:  No LE edema.  Limited foot exam with no ulcerations.  2+ distal pulses. Psychiatric:  grossly normal mood and affect, speech fluent and appropriate, AOx2 Neurologic:  CN 2-12 grossly intact, moves all extremities in coordinated fashion, sensation intact   Radiological Exams on Admission: Independently reviewed - see discussion in A/P where applicable  CT Angio Chest PE W and/or Wo Contrast  Result Date: 01/10/2023 CLINICAL DATA:  Near syncopal episode, high probability of pulmonary embolism. EXAM: CT ANGIOGRAPHY CHEST WITH CONTRAST TECHNIQUE: Multidetector CT imaging of the chest was performed using the standard protocol during bolus administration of intravenous contrast. Multiplanar CT image reconstructions and MIPs were obtained to evaluate the vascular anatomy. RADIATION DOSE REDUCTION: This exam was performed according to the departmental dose-optimization program which includes automated exposure control,  adjustment of the mA and/or kV according to patient size and/or use of iterative reconstruction technique. CONTRAST:  75mL OMNIPAQUE IOHEXOL 350 MG/ML SOLN COMPARISON:  Portable chest yesterday, portable chest 03/26/2021. No prior chest CT. FINDINGS: Cardiovascular: In general there is a large arterial embolic clot burden affecting both lungs and associated with right heart strain with RV/LV ratio of 1.4. The pulmonary trunk is upper normal in caliber. There is a saddle thrombus straddling the division of the right and left main pulmonary arteries. On the right, acute clot nearly fills the distal main artery, with linear thrombus extending into the right upper lobe lobar and segmental arteries, with occlusive thrombus in the right lower lobe main artery extending into multiple segmental and subsegmental arteries. There is nonocclusive right middle lobe lobar and segmental arterial thrombus. On the left, clot nearly occludes the upper lobe apical segmental artery with extension into at least 1 subsegmental branch, extends into the upper lobe anterior segmental artery and lingular segmental artery, and almost completely occludes the lower lobe main artery with extension into multiple basal segmental and subsegmental branches. There is mild-to-moderate cardiomegaly with a right chamber predominance. No visible coronary calcifications or substantial pericardial fluid. Moderate to heavy calcification noted in the aorta in the distal arch and descending segment, with aortic tortuosity without aneurysm or dissection. The great vessels branch normally. The proximal 1.8 cm of the left subclavian artery is heavily calcified and is either very severely stenotic or occluded, predisposing to subclavian steal syndrome warranting clinical correlation. The great vessels do not show significant narrowing. The pulmonary veins are normal in caliber. Mediastinum/Nodes: Small hiatal hernia. Mild generalized thickening of the thoracic  esophagus consistent with esophagitis. Endoscopy may be indicated. Thyroid gland and axillary spaces are unremarkable. There is no intrathoracic adenopathy. The thoracic trachea and main bronchi are clear. Lungs/Pleura: There are trace pleural effusions. No pneumothorax. No pleural thickening. Bilateral bronchial thickening noted in the lower lobes, in the left lower lobe with scattered subsegmental bronchial plugging. There is mild posterior atelectasis in the lungs but no infiltrates or nodules. Upper Abdomen: Cirrhotic liver configuration. No splenomegaly. No acute upper abdominal findings. Musculoskeletal: There is mild anterior wedging  of the T1 and T4 vertebral bodies with chronic appearance. At T12, a partially visible moderate to severe upper plate compression fracture is noted, age indeterminate but there is no adjacent soft tissue thickening or fluid. This was not present on a lumbar spine plain film study of 07/10/2020, which is the last file study which included this area. There is about 4 mm retropulsion of the posterosuperior T12 cortex into the spinal canal, narrowing the AP thecal sac to 8 mm. I believe this is probably subacute or chronic based on the gross appearance. Review of the MIP images confirms the above findings. IMPRESSION: 1. Positive for large volume acute PE with CT evidence of right heart strain (RV/LV Ratio = 1.4 ) consistent with at least submassive (intermediate risk) PE. The presence of right heart strain has been associated with an increased risk of morbidity and mortality. Please refer to the "Code PE Focused" order set in EPIC. 2. Cardiomegaly with right chamber predominance. 3. Trace pleural effusions. 4. Bilateral lower lobe bronchitis with scattered subsegmental bronchial plugging left lower lobe. 5. Aortic atherosclerosis. 6. 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 7.  Cirrhotic liver configuration. 8. Small hiatal hernia with generalized thickening of the thoracic esophagus consistent with esophagitis. Endoscopy may be indicated. 9. T12 partially visible moderate to severe upper plate compression fracture, age indeterminate but I believe this is probably subacute or chronic. There is about 4 mm retropulsion of the posterosuperior cortex into the spinal canal narrowing the AP thecal sac to 8 mm. 10. Critical Value/emergent results were called by telephone at the time of interpretation on 01/10/2023 at 4:36 am to provider [employer] [facility] , who verbally acknowledged these results. Aortic Atherosclerosis (ICD10-I70.0). Electronically Signed   By: Almira Bar M.D.   On: 01/10/2023 04:47   CT Head Wo Contrast  Result Date: 01/10/2023 CLINICAL DATA:  Near syncopal episode EXAM: CT HEAD WITHOUT CONTRAST TECHNIQUE: Contiguous axial images were obtained from the base of the skull through the vertex without intravenous contrast. RADIATION DOSE REDUCTION: This exam was performed according to the departmental dose-optimization program which includes automated exposure control, adjustment of the mA and/or kV according to patient size and/or use of iterative reconstruction technique. COMPARISON:  01/14/2022 FINDINGS: Brain: No evidence of acute infarction, hemorrhage, mass, mass effect, or midline shift. No hydrocephalus or extra-axial fluid collection. Periventricular white matter changes, likely the sequela of chronic small vessel ischemic disease. Cerebral atrophy with ex vacuo dilatation the ventricles. Vascular: No hyperdense vessel. Atherosclerotic calcifications in the intracranial carotid and vertebral arteries. Skull: Negative for fracture or focal lesion. Sinuses/Orbits: No acute finding. Status post bilateral lens replacements. Other: The mastoid air cells are well aerated. IMPRESSION: No acute intracranial process. Electronically Signed   By: Wiliam Ke M.D.   On:  01/10/2023 02:05   DG Chest Port 1 View  Result Date: 01/09/2023 CLINICAL DATA:  Weakness and fatigue. EXAM: PORTABLE CHEST 1 VIEW COMPARISON:  03/26/2021 FINDINGS: The heart is mildly enlarged. Unchanged mediastinal contours. Aortic atherosclerosis. Suspect small left pleural effusion. Streaky right infrahilar atelectasis. No pneumothorax. No pulmonary edema. IMPRESSION: Mild cardiomegaly. Suspect small left pleural effusion. Streaky right infrahilar atelectasis. Electronically Signed   By: Narda Rutherford M.D.   On: 01/09/2023 23:53    EKG: Independently reviewed.  Sinus tachycardia with rate 107; nonspecific ST changes with no evidence of acute ischemia. RBBB and LAFB smiliar ot previous    Labs on Admission: I  have personally reviewed the available labs and imaging studies at the time of the admission.  Pertinent labs:   platelets 80,  ALT: 50,  troponin 74>405,  BNP: 169,  lactic acid 1.3  Assessment and Plan: Principal Problem:   Acute pulmonary embolism (HCC) Active Problems:   Thrombocytopenia (HCC)   Subclavian artery disease (HCC)   T12 compression fracture (HCC)   Frequent falls   Controlled type 2 diabetes mellitus without complication, without long-term current use of insulin (HCC)   Essential hypertension, benign   Depression with anxiety   Memory loss   Hypercholesteremia   OSA (obstructive sleep apnea)    Assessment and Plan: * Acute pulmonary embolism (HCC) 82 year old female presenting to ED with fatigue, weakness, fall and loss of consciousness found to have submassive PE  -admit to progressive -continue heparin gtt -pulm consulted: continue conservative management since hemodynamically stable, but if becomes hypotensive/unstable they discussed thrombectomy with family  -right leg with edema, check DVT studies -echo pending  -troponin elevated, bnp mild bump  -continue IVF  -no family hx of clotting disorders, no recent travel, does not smoke. Recent  covid vaccines in last 4 weeks  -discussed anticoagulation with her husband and fall risk. She has frequent falls at home-PT consult    Thrombocytopenia (HCC) Secondary to PE/prolonged PTT Follow closely   Subclavian artery disease (HCC) CTA showing severely stenotic or occluded proximal left subclavian artery.  She denies any left sided arm/hand complaints Consider outpatient f/u   T12 compression fracture [facility] Husband states this is not new. She had PT which wasn't very helpful She has no more pain regarding this.   Frequent falls Likely combination of weakness/memory loss She does have soft bp and could be in setting of submassive PE, but on farxiga and losartan Check orthostatics/hold farxiga  TED hose  PT eval   Controlled type 2 diabetes mellitus without complication, without long-term current use of insulin (HCC) A1C of 6.5 in 2019, appears diet controlled A1C pending SSI and accuchecks qac/hs   Essential hypertension, benign Soft bp in setting of submassive PE, hold losartan   Depression with anxiety Continue celexa daily   Memory loss Husband states she has no diagnosis of dementia.  She needs full assist with ADLs  ? Early dementia with frequent falls Continue namenda/delirium precautions   Hypercholesteremia Continue lipitor 20mg  daily   OSA (obstructive sleep apnea) No longer on CPAP. Husband state she hasn't needed this in years    Advance Care Planning:   Code Status: Full Code   Consults: pulm/PT   DVT Prophylaxis: heparin gtt   Family Communication: husband at bedside.   Severity of Illness: The appropriate patient status for this patient is INPATIENT. Inpatient status is judged to be reasonable and necessary in order to provide the required intensity of service to ensure the patient's safety. The patient's presenting symptoms, physical exam findings, and initial radiographic and laboratory data in the context of their chronic comorbidities is  felt to place them at high risk for further clinical deterioration. Furthermore, it is not anticipated that the patient will be medically stable for discharge from the hospital within 2 midnights of admission.   * I certify that at the point of admission it is my clinical judgment that the patient will require inpatient hospital care spanning beyond 2 midnights from the point of admission due to high intensity of service, high risk for further deterioration and high frequency of surveillance required.*  Author: Orland Mustard,  MD 01/10/2023 8:20 AM  For on call review [email or web address].

## 2023-01-10 NOTE — Progress Notes (Signed)
PHARMACY - ANTICOAGULATION CONSULT NOTE  Pharmacy Consult for heparin Indication: chest pain/ACS >> PE w/ RHS   Allergies  Allergen Reactions   Penicillins Anaphylaxis   Vancomycin Rash    Patient Measurements: Height: 5\' 2"  (157.5 cm) Weight: 75 kg (165 lb 5.5 oz) IBW/kg (Calculated) : 50.1 Heparin Dosing Weight: 66.3kg   Vital Signs: Temp: 97.9 F (36.6 C) (10/20 2037) Temp Source: Oral (10/20 2037) BP: 117/54 (10/20 2037) Pulse Rate: 79 (10/20 2037)  Labs: Recent Labs    01/09/23 2330 01/10/23 0149 01/10/23 0434 01/10/23 0456 01/10/23 0504 01/10/23 0845 01/10/23 0956 01/10/23 1309 01/10/23 2257  HGB 15.5*  --  13.8  --   --   --   --   --   --   HCT 46.7*  --  40.2  --   --   --   --   --   --   PLT 80*  --  80*  --   --   --   --   --   --   APTT  --   --   --   --  >200*  --  174*  --   --   LABPROT  --   --   --   --  15.8*  --   --   --   --   INR  --   --   --   --  1.2  --   --   --   --   HEPARINUNFRC  --   --   --   --   --   --   --  0.97* 0.59  CREATININE 0.81  --   --  0.79  --   --   --   --   --   TROPONINIHS 74* 405*  --   --   --  379*  --   --   --     Estimated Creatinine Clearance: 53.2 mL/min (by C-G formula based on SCr of 0.79 mg/dL).   Medical History: Past Medical History:  Diagnosis Date   Anginal pain (HCC)    "went to ED-complete work up and everything was fine"   Anxiety    Borderline diabetes    Carotid artery disease (HCC)    a. 09/2011 Carotid U/S: Eccentric plaque bilat carotid bulbs & origin of bilat ICA's->50-69% on right with borderline elvation of velocities on left.  Degree of narrowing appeared more severe than acquired by velocities.   Carpal tunnel syndrome    Chest pain    a. admx with CP 11/13 => Lexiscan MV 01/25/12:  No ischemia, EF 72%.     Colon polyps    Depression    Diabetes mellitus without complication (HCC)    Ejection Fraction    a. Echo 01/25/12: mild LVH, EF 55-60%, Gr 1 diast dysfn.    Frequent  falls    GERD (gastroesophageal reflux disease)    History of kidney stones    none since age 75   Hypercholesteremia    a. Treated x 5 yrs    Hypertension    a. Treated x 10-15 yrs   Memory difficulty    OCD (obsessive compulsive disorder)    OSA on CPAP    Osteoarthritis    a. s/p R TKA 2003;  b. s/p L TKA 2007.   Vertigo       Assessment: 33 yoF s/p syncope with elevated trops, pharmacy to dose IV heparin  for ACS r/o. Historical wts ~70kg. No AC PTA, H/H wnl. Pltc low. CT head normal. CTA showing PE with RHS. Noted that patient pulled out IV at 12:30, unlikely significant time paused. PLTs 80 and HgB 13.8.   Heparin level is therapeutic at 0.59.  Goal of Therapy:  Heparin level 0.3-0.7 units/ml Monitor platelets by anticoagulation protocol: Yes   Plan:  Continue heparin 750 units/h Check confirmatory heparin level with am labs  Fredonia Highland, PharmD, BCPS, [facility] Clinical Pharmacist Please check AMION for all [facility] Pharmacy numbers 01/10/2023

## 2023-01-10 NOTE — ED Notes (Signed)
Pt spouse assisting with breakfast tray

## 2023-01-10 NOTE — Assessment & Plan Note (Signed)
Continue celexa daily

## 2023-01-10 NOTE — ED Notes (Signed)
Upon entering room NT noticed pt had pulled out her IV. RN changed pt gown and restarted heparin infusion.

## 2023-01-10 NOTE — Assessment & Plan Note (Signed)
A1C of 6.5 in 2019, appears diet controlled A1C pending SSI and accuchecks qac/hs

## 2023-01-10 NOTE — ED Notes (Signed)
Nurse stated that the patient  vitals weren't accurate due to the  Pt moving an the  Nurse stated that I needed to remove the  vitals that I validated

## 2023-01-10 NOTE — Progress Notes (Signed)
  Carryover admission to the Day Admitter.  I discussed this case with the EDP, Barrie Dunker, PA.  Per these discussions:   This is a 82 year old female who is being admitted with acute bilateral pulmonary emboli w/ right heart strain in the setting of presenting presyncope, and with presenting labs notable for elevated troponin.  Repeat patient presented for evaluation of a near syncopal episode that occurred on 01/09/2023.  Specifically, as the patient was rising from a seated to a standing position to accompany her husband to go out to dinner, she suddenly developed dizziness, lightheadedness.  As a result of this, she began to fall backwards, but was caught by her husband before hitting the ground.  The patient conveys that while she felt that she was about to lose consciousness, that she did not formally lose consciousness as a consequence of the above.  No ensuing episodes of presyncope, and no recent episodes of loss of consciousness.  Denies any associated or recent chest pain or shortness of breath.  Vital signs notable for the following: Afebrile [place] rates in the 90s to low 100s; systolic blood pressures in the low 100s, with maps greater than 65.  Oxygen saturation 98 to 100% on room air.  Initial troponin 75, with repeat trending up to 407.  EKG without evidence of acute ischemic changes.  EDP discussed patient's case with on-call cardiology fellow, who will formally consult.  CTA chest showed submassive acute pulmonary embolism with CT evidence of right heart strain.  Patient was started on heparin drip.  I have also asked that EDP discussed patient's case with on-call PCCM given the associated right heart strain on CTA chest.   I have placed an order for inpatient admission to PCU for further evaluation management of the above.  I have placed some additional preliminary admit orders via the adult multi-morbid admission order set. I have also ordered echocardiogram, repeat  troponin level, and continues lactated Ringer's for preload support given evidence of right sided heart strain on imaging.  Of also ordered morning labs in the form of CMP, CBC, and serum magnesium level.    Newton Pigg, DO Hospitalist

## 2023-01-11 ENCOUNTER — Inpatient Hospital Stay (HOSPITAL_COMMUNITY): Payer: Medicare Other

## 2023-01-11 ENCOUNTER — Other Ambulatory Visit (HOSPITAL_COMMUNITY): Payer: Self-pay

## 2023-01-11 DIAGNOSIS — R55 Syncope and collapse: Secondary | ICD-10-CM

## 2023-01-11 DIAGNOSIS — D696 Thrombocytopenia, unspecified: Secondary | ICD-10-CM

## 2023-01-11 DIAGNOSIS — E7800 Pure hypercholesterolemia, unspecified: Secondary | ICD-10-CM

## 2023-01-11 DIAGNOSIS — I10 Essential (primary) hypertension: Secondary | ICD-10-CM

## 2023-01-11 DIAGNOSIS — E119 Type 2 diabetes mellitus without complications: Secondary | ICD-10-CM

## 2023-01-11 DIAGNOSIS — G4733 Obstructive sleep apnea (adult) (pediatric): Secondary | ICD-10-CM

## 2023-01-11 DIAGNOSIS — R296 Repeated falls: Secondary | ICD-10-CM

## 2023-01-11 DIAGNOSIS — I2609 Other pulmonary embolism with acute cor pulmonale: Secondary | ICD-10-CM | POA: Diagnosis not present

## 2023-01-11 DIAGNOSIS — F0390 Unspecified dementia without behavioral disturbance: Secondary | ICD-10-CM | POA: Diagnosis not present

## 2023-01-11 DIAGNOSIS — I739 Peripheral vascular disease, unspecified: Secondary | ICD-10-CM | POA: Diagnosis not present

## 2023-01-11 DIAGNOSIS — R413 Other amnesia: Secondary | ICD-10-CM

## 2023-01-11 LAB — CBC
HCT: 38.2 % (ref 36.0–46.0)
Hemoglobin: 12.6 g/dL (ref 12.0–15.0)
MCH: 31.7 pg (ref 26.0–34.0)
MCHC: 33 g/dL (ref 30.0–36.0)
MCV: 96 fL (ref 80.0–100.0)
Platelets: 72 10*3/uL — ABNORMAL LOW (ref 150–400)
RBC: 3.98 MIL/uL (ref 3.87–5.11)
RDW: 12 % (ref 11.5–15.5)
WBC: 5.9 10*3/uL (ref 4.0–10.5)
nRBC: 0 % (ref 0.0–0.2)

## 2023-01-11 LAB — BASIC METABOLIC PANEL
Anion gap: 11 (ref 5–15)
BUN: 19 mg/dL (ref 8–23)
CO2: 24 mmol/L (ref 22–32)
Calcium: 9.5 mg/dL (ref 8.9–10.3)
Chloride: 103 mmol/L (ref 98–111)
Creatinine, Ser: 0.86 mg/dL (ref 0.44–1.00)
GFR, Estimated: >60 mL/min (ref >60)
Glucose, Bld: 146 mg/dL — ABNORMAL HIGH (ref 70–99)
Potassium: 3.9 mmol/L (ref 3.5–5.1)
Sodium: 138 mmol/L (ref 135–145)

## 2023-01-11 LAB — GLUCOSE, CAPILLARY
Glucose-Capillary: 154 mg/dL — ABNORMAL HIGH (ref 70–99)
Glucose-Capillary: 150 mg/dL — ABNORMAL HIGH (ref 70–99)
Glucose-Capillary: 134 mg/dL — ABNORMAL HIGH (ref 70–99)
Glucose-Capillary: 181 mg/dL — ABNORMAL HIGH (ref 70–99)

## 2023-01-11 LAB — ECHOCARDIOGRAM COMPLETE
Area-P 1/2: 3.76 cm2
Height: 62 in
S' Lateral: 3.4 cm
Weight: 2631.41 [oz_av]

## 2023-01-11 LAB — APTT: aPTT: 150 s — ABNORMAL HIGH (ref 24–36)

## 2023-01-11 LAB — HEPARIN LEVEL (UNFRACTIONATED): Heparin Unfractionated: 0.5 [IU]/mL (ref 0.30–0.70)

## 2023-01-11 MED ORDER — APIXABAN 5 MG PO TABS: 5.0000 mg | ORAL_TABLET | Freq: Two times a day (BID) | ORAL | Status: DC

## 2023-01-11 MED ORDER — APIXABAN 5 MG PO TABS
10.0000 mg | ORAL_TABLET | Freq: Two times a day (BID) | ORAL | Status: DC
  Administered 2023-01-11 – 2023-01-12 (×3): 10 mg via ORAL
  Filled 2023-01-11 (×3): qty 2

## 2023-01-11 NOTE — TOC Initial Note (Signed)
Transition of Care (TOC) - Initial/Assessment Note  Donn Pierini RN, BSN Transitions of Care Unit 4E- RN Case Manager See Treatment Team for direct phone #   Patient Details  Name: Tiffany Burke MRN: [id number] Date of Birth: 12-Jun-1940  Transition of Care [facility]) CM/SW Contact:    Darrold Span, RN Phone Number: 01/11/2023, 4:56 PM  Clinical Narrative:                 Noted recommendations for [facility] and DME needs- MD to place order.  Pt from home w/ spouse.  CM in to speak with pt, spouse and son also present.  Discussed HH and DME needs. List provided for [facility] choice- pt has used [facility] in the past.  After review - [facility] was selected for [facility] needs.   Confirmed DME needs- transport chair and BSC- spouse requesting DME to be delivered to home. No preference for DME provider.    Address, phone # and PCP all confirmed  Also spoke about Private Duty aides that are not covered under Medicare coverage that family can look into should they want someone coming to assist for blocks of time with ADLS, etc.   Call made to [facility] for [facility] referral- referral has been accepted-   TOC will follow up in am for DME referral once orders have been co-signed.   Expected Discharge Plan: Home w Home Health Services Barriers to Discharge: No Barriers Identified   Patient Goals and CMS Choice Patient states their goals for this hospitalization and ongoing recovery are:: return home CMS Medicare.gov Compare Post Acute Care list provided to:: Patient Choice offered to / list presented to : Patient, Spouse, Adult Children      Expected Discharge Plan and Services   Discharge Planning Services: CM Consult Post Acute Care Choice: Durable Medical Equipment, Home Health Living arrangements for the past 2 months: Single Family Home                 DME Arranged: Bedside commode, Other see comment (transport chair) DME Agency: [facility]       HH Arranged: RN, PT, OT,  Nurse's Aide HH Agency: [facility] Date [facility] Agency Contacted: 01/11/23 Time HH Agency Contacted: [id number] Representative spoke with at [facility] Agency: Kandee Keen  Prior Living Arrangements/Services Living arrangements for the past 2 months: Single Family Home Lives with:: Spouse Patient language and need for interpreter reviewed:: Yes Do you feel safe going back to the place where you live?: Yes      Need for Family Participation in Patient Care: Yes (Comment) Care giver support system in place?: Yes (comment) Current home services: DME (RW) Criminal Activity/Legal Involvement Pertinent to Current Situation/Hospitalization: No - Comment as needed  Activities of Daily Living   ADL Screening (condition at time of admission) Independently performs ADLs?: No Does the patient have a NEW difficulty with bathing/dressing/toileting/self-feeding that is expected to last >3 days?: No Does the patient have a NEW difficulty with getting in/out of bed, walking, or climbing stairs that is expected to last >3 days?: No Does the patient have a NEW difficulty with communication that is expected to last >3 days?: No Is the patient deaf or have difficulty hearing?: No Does the patient have difficulty seeing, even when wearing glasses/contacts?: No Does the patient have difficulty concentrating, remembering, or making decisions?: No  Permission Sought/Granted Permission sought to share information with : Facility Industrial/product designer granted to share information with : Yes, Verbal Permission Granted  Permission granted to share info w AGENCY: HH/DME        Emotional Assessment Appearance:: Appears stated age Attitude/Demeanor/Rapport: Engaged Affect (typically observed): Pleasant Orientation: : Oriented to Self, Oriented to Place, Oriented to  Time, Oriented to Situation Alcohol / Substance Use: Not Applicable Psych Involvement: No (comment)  Admission diagnosis:  Elevated  troponin [R79.89] Acute pulmonary embolism (HCC) [I26.99] Near syncope [R55] Patient Active Problem List   Diagnosis Date Noted   Acute pulmonary embolism (HCC) 01/10/2023   Controlled type 2 diabetes mellitus without complication, without long-term current use of insulin (HCC) 01/10/2023   Subclavian artery disease (HCC) 01/10/2023   T12 compression fracture (HCC) 01/10/2023   Memory loss 01/10/2023   Frequent falls 01/10/2023   Thrombocytopenia (HCC) 01/10/2023   Depression with anxiety 02/06/2016   Failed total right knee replacement (HCC) 03/28/2014   OA (osteoarthritis) of knee 03/28/2014   Hypercholesteremia    Carotid artery disease (HCC)    Colon polyps    Nephrolithiasis    Osteoarthritis    Chest pain    Ejection Fraction    OSA (obstructive sleep apnea)    Essential hypertension, benign 01/24/2012   OCD (obsessive compulsive disorder)    PCP:  Merri Brunette, MD Pharmacy:   [facility] [address], [place] - [address] [place]  [address] Phone: [phone] Fax: [phone]     Social Determinants of Health (SDOH) Social History: SDOH Screenings   Food Insecurity: No Food Insecurity (01/10/2023)  Housing: Low Risk  (01/10/2023)  Transportation Needs: No Transportation Needs (01/10/2023)  Utilities: Not At Risk (01/10/2023)  Tobacco Use: Medium Risk (01/09/2023)   SDOH Interventions:     Readmission Risk Interventions     No data to display

## 2023-01-11 NOTE — Evaluation (Addendum)
Physical Therapy Evaluation Patient Details Name: Tiffany Burke MRN: [id number] DOB: 1940/03/29 Today's Date: 01/11/2023  History of Present Illness  Patient is an 82 y/o female admitted 01/09/23 after fall at home backwards with LOC.  Found to have submassive PE; no DVT noted on LE duplex. CTH negative for acute change.  PMH positive for frequent falls, HTN, HLD, CAD, depression/anxiety, DM2, GERD, OSA on CPAP.  Clinical Impression  Patient presents with decreased mobility due to limited activity tolerance, decreased balance and generalized weakness.  Previously assisted for transfers, bed mobility and ambulation with rollator by her spouse.  Has had falls at home mostly due to sitting in recliner that is lifted and sliding to the floor.  Did note some drop in BP initial standing, but pt denied symptoms and note rebounded.  She walked on room air with some mild dyspnea, difficulty measuring accurately though with small hand probe dropped to 85% after ambulation, RN to check again.  She will benefit from skilled PT in the acute setting and from follow up HHPT at d/c.    Orthostatic VS for the past 24 hrs (Last 3 readings):  BP- Lying BP- Sitting BP- Standing at 0 minutes BP- Standing at 3 minutes  01/11/23 1500 118/84 124/86 (!) 86/72 100/88         If plan is discharge home, recommend the following: A lot of help with bathing/dressing/bathroom;A lot of help with walking and/or transfers;Help with stairs or ramp for entrance;Assistance with cooking/housework   Can travel by private vehicle        Equipment Recommendations BSC/3in1;Wheelchair (measurements PT) (transport wheelchair)  Recommendations for Other Services       Functional Status Assessment Patient has had a recent decline in their functional status and demonstrates the ability to make significant improvements in function in a reasonable and predictable amount of time.     Precautions / Restrictions  Precautions Precautions: Fall Precaution Comments: watch O2      Mobility  Bed Mobility Overal bed mobility: Needs Assistance Bed Mobility: Supine to Sit     Supine to sit: Mod assist     General bed mobility comments: lifting for trunk and assist to scoot to EOB    Transfers Overall transfer level: Needs assistance Equipment used: Rolling walker (2 wheels) Transfers: Sit to/from Stand Sit to Stand: Mod assist           General transfer comment: some lifting help to stand from EOB    Ambulation/Gait Ambulation/Gait assistance: Mod assist, Min assist Gait Distance (Feet): 80 Feet Assistive device: Rolling walker (2 wheels) Gait Pattern/deviations: Step-through pattern, Step-to pattern, Decreased stride length, Wide base of support, Decreased dorsiflexion - right       General Gait Details: assist for balance, walker management (used to rollator), noted L lateral lean at times with A to recover and decreased clearance on R foot throughout  Stairs            Wheelchair Mobility     Tilt Bed    Modified Rankin (Stroke Patients Only)       Balance Overall balance assessment: Needs assistance Sitting-balance support: Feet supported Sitting balance-Leahy Scale: Fair     Standing balance support: Bilateral upper extremity supported Standing balance-Leahy Scale: Poor Standing balance comment: UE support and min A due to initial posterior lean  Pertinent Vitals/Pain Pain Assessment Pain Assessment: No/denies pain    Home Living Family/patient expects to be discharged to:: Private residence Living Arrangements: Spouse/significant other Available Help at Discharge: Family;Available 24 hours/day Type of Home: House Home Access: Stairs to enter Entrance Stairs-Rails: Right;Left (grab bar at the time) Entrance Stairs-Number of Steps: 6   Home Layout: One level Home Equipment: Rollator (4 wheels);Shower  seat;Grab bars - tub/shower;Grab bars - toilet Additional Comments: had a wheelchair but was too big    Prior Function Prior Level of Function : Needs assist             Mobility Comments: assist up OOB and out of chairs and walks with rollator; falls frequently (5 in 6 months approx) usually sliding out of lift chair trying to sit ADLs Comments: spouse helps shower and dress and does her hair     Extremity/Trunk Assessment   Upper Extremity Assessment Upper Extremity Assessment: Generalized weakness    Lower Extremity Assessment Lower Extremity Assessment: Generalized weakness    Cervical / Trunk Assessment Cervical / Trunk Assessment: Kyphotic  Communication   Communication Communication: No apparent difficulties  Cognition Arousal: Alert Behavior During Therapy: WFL for tasks assessed/performed Overall Cognitive Status: History of cognitive impairments - at baseline                                 General Comments: baseline dementia, took time and choices to say in hospital, recalled that she lives in [place], able to indicated issue with her lungs as reason she is here        General Comments General comments (skin integrity, edema, etc.): son present and supportive, reports his dad has a sequence to help her on stairs, patient reports enjoys reading, but unable to recall the book she is reading.  See orthostatic flowsheet, difficulty with SpO2 measurement due to X2 not working used portable monitor reading 95% in hallway and 85% once in back in room in chair with some dyspnea noted, back to 90% within 2 minutes and wall monitor reading 95%.    Exercises     Assessment/Plan    PT Assessment Patient needs continued PT services  PT Problem List Decreased strength;Decreased knowledge of use of DME;Decreased activity tolerance;Decreased safety awareness;Decreased balance;Decreased knowledge of precautions;Decreased mobility;Impaired sensation        PT Treatment Interventions DME instruction;Gait training;Stair training;Functional mobility training;Therapeutic activities;Balance training;Therapeutic exercise;Patient/family education;Neuromuscular re-education    PT Goals (Current goals can be found in the Care Plan section)  Acute Rehab PT Goals Patient Stated Goal: to return home PT Goal Formulation: With patient/family Time For Goal Achievement: 01/25/23 Potential to Achieve Goals: Good    Frequency Min 1X/week     Co-evaluation               AM-PAC PT "6 Clicks" Mobility  Outcome Measure Help needed turning from your back to your side while in a flat bed without using bedrails?: A Lot Help needed moving from lying on your back to sitting on the side of a flat bed without using bedrails?: A Lot Help needed moving to and from a bed to a chair (including a wheelchair)?: A Lot Help needed standing up from a chair using your arms (e.g., wheelchair or bedside chair)?: A Lot Help needed to walk in hospital room?: A Lot Help needed climbing 3-5 steps with a railing? : Total 6 Click Score: 11    End  of Session Equipment Utilized During Treatment: Gait belt Activity Tolerance: Patient limited by fatigue Patient left: in chair;with family/visitor present;with call bell/phone within reach   PT Visit Diagnosis: Other abnormalities of gait and mobility (R26.89);History of falling (Z91.81);Muscle weakness (generalized) (M62.81)    Time: 1430-1510 PT Time Calculation (min) (ACUTE ONLY): 40 min   Charges:   PT Evaluation $PT Eval Moderate Complexity: 1 Mod PT Treatments $Gait Training: 8-22 mins $Therapeutic Activity: 8-22 mins PT General Charges $$ ACUTE PT VISIT: 1 Visit         Sheran Lawless, PT Acute Rehabilitation Services Office:[phone] 01/11/2023   Elray Mcgregor 01/11/2023, 3:27 PM

## 2023-01-11 NOTE — TOC Benefit Eligibility Note (Signed)
Patient Product/process development scientist completed.    The patient is insured through [facility] [employer] [facility] [employer] Patient has Medicare and is not eligible for a copay card, but may be able to apply for patient assistance, if available.    Ran test claim for Eliquis 5 mg and the current 30 day co-pay is $45.00.   This test claim was processed through [facility]- copay amounts may vary at other pharmacies due to pharmacy/plan contracts, or as the patient moves through the different stages of their insurance plan.     Roland Earl, CPHT Pharmacy Technician III Certified Patient Advocate [facility] Patient Advocate Team Direct Number: [phone]  Fax: [phone]

## 2023-01-11 NOTE — Progress Notes (Signed)
PHARMACY - ANTICOAGULATION CONSULT NOTE  Pharmacy Consult for heparin>>apixaban Indication: chest pain/ACS >> PE w/ RHS   Allergies  Allergen Reactions   Penicillins Anaphylaxis   Vancomycin Rash    Patient Measurements: Height: 5\' 2"  (157.5 cm) Weight: 74.6 kg (164 lb 7.4 oz) IBW/kg (Calculated) : 50.1 Heparin Dosing Weight: 66.3kg   Vital Signs: Temp: 98.4 F (36.9 C) (10/21 0709) Temp Source: Oral (10/21 0709) BP: 101/68 (10/21 0709) Pulse Rate: 66 (10/21 0709)  Labs: Recent Labs    01/09/23 2330 01/10/23 0149 01/10/23 0434 01/10/23 0456 01/10/23 0504 01/10/23 0845 01/10/23 0956 01/10/23 1309 01/10/23 2257 01/11/23 0331  HGB 15.5*  --  13.8  --   --   --   --   --   --  12.6  HCT 46.7*  --  40.2  --   --   --   --   --   --  38.2  PLT 80*  --  80*  --   --   --   --   --   --  72*  APTT  --   --   --   --  >200*  --  174*  --   --  150*  LABPROT  --   --   --   --  15.8*  --   --   --   --   --   INR  --   --   --   --  1.2  --   --   --   --   --   HEPARINUNFRC  --   --   --   --   --   --   --  0.97* 0.59 0.50  CREATININE 0.81  --   --  0.79  --   --   --   --   --  0.86  TROPONINIHS 74* 405*  --   --   --  379*  --   --   --   --     Estimated Creatinine Clearance: 49.3 mL/min (by C-G formula based on SCr of 0.86 mg/dL).   Medical History: Past Medical History:  Diagnosis Date   Anginal pain (HCC)    "went to ED-complete work up and everything was fine"   Anxiety    Borderline diabetes    Carotid artery disease (HCC)    a. 09/2011 Carotid U/S: Eccentric plaque bilat carotid bulbs & origin of bilat ICA's->50-69% on right with borderline elvation of velocities on left.  Degree of narrowing appeared more severe than acquired by velocities.   Carpal tunnel syndrome    Chest pain    a. admx with CP 11/13 => Lexiscan MV 01/25/12:  No ischemia, EF 72%.     Colon polyps    Depression    Diabetes mellitus without complication (HCC)    Ejection Fraction     a. Echo 01/25/12: mild LVH, EF 55-60%, Gr 1 diast dysfn.    Frequent falls    GERD (gastroesophageal reflux disease)    History of kidney stones    none since age 82   Hypercholesteremia    a. Treated x 5 yrs    Hypertension    a. Treated x 10-15 yrs   Memory difficulty    OCD (obsessive compulsive disorder)    OSA on CPAP    Osteoarthritis    a. s/p R TKA 2003;  b. s/p L TKA 2007.   Vertigo  Assessment: 82 yoF s/p syncope with elevated trops, pharmacy to dose IV heparin for ACS r/o. Historical wts ~70kg. No AC PTA, H/H wnl. Pltc low. CT head normal. CTA showing PE with RHS. Noted that patient pulled out IV at 12:30, unlikely significant time paused. PLTs 80 and HgB 13.8.   Ok to transition to PO apixaban today.  Hgb wnl, plt<100k  Goal of Therapy:  Monitor platelets by anticoagulation protocol: Yes   Plan:  Dc heparin Apixaban 10mg  BID x7d then 5mg  BID Rx will follow peripherally  Ulyses Southward, PharmD, BCIDP, AAHIVP, CPP Infectious Disease Pharmacist 01/11/2023 11:23 AM

## 2023-01-11 NOTE — Progress Notes (Signed)
PROGRESS NOTE    Tiffany Burke  MWN:[id number] DOB: [birth year] DOA: 01/09/2023 PCP: Merri Brunette, MD   Brief Narrative:  Tiffany Burke is a 82 y.o. female with medical history significant of frequent falls, HTN, HLD, carotid artery disease, depression/anxiety, T2DM, GERD, OSA on cpap who presented to ED after a fall.   Per family at bedside patient had notable episode of syncope versus near syncope, likely secondary to acute PE, submassive per imaging.  Patient's hemodynamics remained stable, PCCM initially consulted due to concern over size that patient may require thrombolytics regulation however she has remained markedly stable on heparin drip, DVT studies bilaterally are negative.  Echo without any acute or overt wall motion abnormalities, grade 1 diastolic dysfunction otherwise generally unremarkable, EF 60 to 65%.  PE likely provoked in the setting of poor ambulatory status, family indicates that patient walks very little and requires moderate assistance with movement.  At this time we will transition patient off heparin drip onto p.o. Eliquis for outpatient follow-up.  We did discuss the risks of anticoagulation in the setting of an elderly patient with high risk of falling.  At this time given patient's improving clinical status she is otherwise stable and agreeable for discharge home.  Assessment & Plan:   Principal Problem:   Acute pulmonary embolism (HCC) Active Problems:   Thrombocytopenia (HCC)   Subclavian artery disease (HCC)   T12 compression fracture (HCC)   Frequent falls   Controlled type 2 diabetes mellitus without complication, without long-term current use of insulin (HCC)   Essential hypertension, benign   Depression with anxiety   Memory loss   Hypercholesteremia   OSA (obstructive sleep apnea)    Acute pulmonary embolism (HCC) -Submassive per imaging - hemodynamically stable - no indication for agressive  measures/thrombolytics -Transition from heparin drip to Eliquis -DVT study negative bilaterally -echo without overt wall motion abnormalities or depressed EF -no family hx of clotting disorders, no recent travel, does not smoke. Recent covid vaccines in last 4 weeks but no recent illness   Thrombocytopenia (HCC) Secondary to PE/prolonged PTT Follow closely with repeat labs per PCP   Subclavian artery disease (HCC) CTA showing severely stenotic or occluded proximal left subclavian artery.  Follow-up with PCP, unclear chronicity, repeat imaging if symptomatic otherwise no indication for intervention at this time given her asymptomatic state.   T12 compression fracture (HCC), chronic Husband states this is not new. She had PT which wasn't very helpful She has no more pain regarding this.    Frequent falls, chronic ambulatory dysfunction Likely combination of weakness/memory loss Continue PT OT, recommend TED hose if orthostatics continue to be an issue, encouraged increased p.o. intake with free water.   Controlled type 2 diabetes mellitus without complication, without long-term current use of insulin (HCC) A1C 8.1   Essential hypertension, benign Soft bp in setting of submassive PE, hold losartan    Depression with anxiety Continue celexa daily    Memory loss Husband states she has no diagnosis of dementia.  She needs full assist with ADLs  ? Early dementia with frequent falls Continue namenda/delirium precautions    Hypercholesteremia Continue lipitor 20mg  daily    OSA (obstructive sleep apnea) No longer on CPAP. Husband state she hasn't needed this in years   DVT prophylaxis: Place TED hose Start: 01/10/23 0814   Code Status:   Code Status: Full Code  Family Communication: Husband and daughter updated  Status is: Inpatient  Dispo: The patient is from: Home  Anticipated d/c is to: Home              Anticipated d/c date is: 24 to 48 hours pending  clinical course              Patient currently not medically stable for discharge, continues to require close monitoring ongoing physical therapy to ensure safe disposition  Consultants:  None  Procedures:  None  Antimicrobials:  None  Subjective: No acute issues or events overnight, review of systems markedly limited due to patient's mental status  Objective: Vitals:   01/10/23 1710 01/10/23 2037 01/11/23 0410 01/11/23 0445  BP: (!) 102/46 (!) 117/54  103/70  Pulse: 81 79  67  Resp: 17 (!) 21  18  Temp: 98.2 F (36.8 C) 97.9 F (36.6 C)  97.7 F (36.5 C)  TempSrc: Oral Oral  Oral  SpO2: 97% 96%  96%  Weight:   74.6 kg   Height:        Intake/Output Summary (Last 24 hours) at 01/11/2023 0744 Last data filed at 01/10/2023 2041 Gross per 24 hour  Intake 1021.34 ml  Output 2 ml  Net 1019.34 ml   Filed Weights   01/10/23 1300 01/11/23 0410  Weight: 75 kg 74.6 kg    Examination:  General:  Pleasantly resting in bed, No acute distress.  Alert to person only HEENT:  Normocephalic atraumatic.  Sclerae nonicteric, noninjected.  Extraocular movements intact bilaterally. Neck:  Without mass or deformity.  Trachea is midline. Lungs:  Clear to auscultate bilaterally without rhonchi, wheeze, or rales. Heart:  Regular rate and rhythm.  Without murmurs, rubs, or gallops. Abdomen:  Soft, nontender, nondistended.  Without guarding or rebound. Extremities: Without cyanosis, clubbing, edema, or obvious deformity. Skin:  Warm and dry, no erythema.  Data Reviewed: I have personally reviewed following labs and imaging studies  CBC: Recent Labs  Lab 01/09/23 2330 01/10/23 0434 01/11/23 0331  WBC 8.9 6.7 5.9  NEUTROABS 6.8  --   --   HGB 15.5* 13.8 12.6  HCT 46.7* 40.2 38.2  MCV 95.5 93.1 96.0  PLT 80* 80* 72*   Basic Metabolic Panel: Recent Labs  Lab 01/09/23 2330 01/10/23 0456 01/10/23 0956 01/11/23 0331  NA 139 138  --  138  K 4.4 4.3  --  3.9  CL 102 101  --   103  CO2 21* 24  --  24  GLUCOSE 243* 165*  --  146*  BUN 18 17  --  19  CREATININE 0.81 0.79  --  0.86  CALCIUM 10.1 10.0  --  9.5  MG  --  2.1 2.1  --    GFR: Estimated Creatinine Clearance: 49.3 mL/min (by C-G formula based on SCr of 0.86 mg/dL). Liver Function Tests: Recent Labs  Lab 01/09/23 2330 01/10/23 0456  AST 39 27  ALT 50* 39  ALKPHOS 97 79  BILITOT 1.5* 1.7*  PROT 7.5 6.3*  ALBUMIN 4.2 3.4*   Coagulation Profile: Recent Labs  Lab 01/10/23 0504  INR 1.2   HbA1C: Recent Labs    01/10/23 0845  HGBA1C 8.1*   CBG: Recent Labs  Lab 01/10/23 0828 01/10/23 1222 01/10/23 1706 01/10/23 2109 01/11/23 0559  GLUCAP 162* 188* 195* 152* 154*   Sepsis Labs: Recent Labs  Lab 01/10/23 0444 01/10/23 0507 01/10/23 0845  LATICACIDVEN 1.3 1.3 1.5    No results found for this or any previous visit (from the past 240 hour(s)).       Radiology  Studies: VAS [place] LOWER EXTREMITY VENOUS (DVT)  Result Date: 01/10/2023  Lower Venous DVT Study Patient Name:  [facility] Wurzer  Date of Exam:   01/10/2023 Medical Rec #: [id number]                   Accession #:    [id number] Date of Birth: 10/10/1940                   Patient Gender: F Patient Age:   35 years Exam Location:  [facility] Procedure:      VAS [place] LOWER EXTREMITY VENOUS (DVT) Referring Phys: Orland Mustard --------------------------------------------------------------------------------  Indications: Pulmonary embolism.  Limitations: Altered mental status and body habitus. Comparison Study: No prior study on file Performing Technologist: Sherren Kerns RVS  Examination Guidelines: A complete evaluation includes B-mode imaging, spectral Doppler, color Doppler, and power Doppler as needed of all accessible portions of each vessel. Bilateral testing is considered an integral part of a complete examination. Limited examinations for reoccurring indications may be performed as noted. The reflux portion of  the exam is performed with the patient in reverse Trendelenburg.  +--------+---------------+---------+-----------+----------------+-------------+ RIGHT   CompressibilityPhasicitySpontaneityProperties      Thrombus                                                                 Aging         +--------+---------------+---------+-----------+----------------+-------------+ CFV     Full           Yes      No         pulsatile                                                                waveforms                     +--------+---------------+---------+-----------+----------------+-------------+ SFJ     Full                                                             +--------+---------------+---------+-----------+----------------+-------------+ FV Prox Full                                                             +--------+---------------+---------+-----------+----------------+-------------+ FV Mid  Full                                                             +--------+---------------+---------+-----------+----------------+-------------+ FV  Full                                                             Distal                                                                   +--------+---------------+---------+-----------+----------------+-------------+ PFV     Full                                                             +--------+---------------+---------+-----------+----------------+-------------+ POP     Full           Yes      Yes                                      +--------+---------------+---------+-----------+----------------+-------------+ PTV     Full                                                             +--------+---------------+---------+-----------+----------------+-------------+ PERO    Full                                                              +--------+---------------+---------+-----------+----------------+-------------+   +---------+---------------+---------+-----------+---------------+--------------+ LEFT     CompressibilityPhasicitySpontaneityProperties     Thrombus Aging +---------+---------------+---------+-----------+---------------+--------------+ CFV      Full           Yes      No         pulsatile                                                                 waveforms                     +---------+---------------+---------+-----------+---------------+--------------+ SFJ      Full                                                             +---------+---------------+---------+-----------+---------------+--------------+ FV Prox  Full                                                             +---------+---------------+---------+-----------+---------------+--------------+  FV Mid   Full                                                             +---------+---------------+---------+-----------+---------------+--------------+ FV DistalFull                                                             +---------+---------------+---------+-----------+---------------+--------------+ PFV      Full                                                             +---------+---------------+---------+-----------+---------------+--------------+ POP                     Yes      Yes                       patent by                                                                 color and                                                                 Doppler        +---------+---------------+---------+-----------+---------------+--------------+ PTV      Full                                                             +---------+---------------+---------+-----------+---------------+--------------+ PERO     Full                                                              +---------+---------------+---------+-----------+---------------+--------------+    Summary: BILATERAL: - No evidence of deep vein thrombosis seen in the lower extremities, bilaterally. -No evidence of popliteal cyst, bilaterally.   *See table(s) above for measurements and observations.    Preliminary    CT Angio Chest PE W and/or Wo Contrast  Result Date: 01/10/2023 CLINICAL DATA:  Near syncopal episode, high probability of pulmonary embolism. EXAM: CT ANGIOGRAPHY CHEST WITH CONTRAST TECHNIQUE: Multidetector CT imaging of the chest  was performed using the standard protocol during bolus administration of intravenous contrast. Multiplanar CT image reconstructions and MIPs were obtained to evaluate the vascular anatomy. RADIATION DOSE REDUCTION: This exam was performed according to the departmental dose-optimization program which includes automated exposure control, adjustment of the mA and/or kV according to patient size and/or use of iterative reconstruction technique. CONTRAST:  75mL OMNIPAQUE IOHEXOL 350 MG/ML SOLN COMPARISON:  Portable chest yesterday, portable chest 03/26/2021. No prior chest CT. FINDINGS: Cardiovascular: In general there is a large arterial embolic clot burden affecting both lungs and associated with right heart strain with RV/LV ratio of 1.4. The pulmonary trunk is upper normal in caliber. There is a saddle thrombus straddling the division of the right and left main pulmonary arteries. On the right, acute clot nearly fills the distal main artery, with linear thrombus extending into the right upper lobe lobar and segmental arteries, with occlusive thrombus in the right lower lobe main artery extending into multiple segmental and subsegmental arteries. There is nonocclusive right middle lobe lobar and segmental arterial thrombus. On the left, clot nearly occludes the upper lobe apical segmental artery with extension into at least 1 subsegmental branch, extends into the upper lobe anterior  segmental artery and lingular segmental artery, and almost completely occludes the lower lobe main artery with extension into multiple basal segmental and subsegmental branches. There is mild-to-moderate cardiomegaly with a right chamber predominance. No visible coronary calcifications or substantial pericardial fluid. Moderate to heavy calcification noted in the aorta in the distal arch and descending segment, with aortic tortuosity without aneurysm or dissection. The great vessels branch normally. The proximal 1.8 cm of the left subclavian artery is heavily calcified and is either very severely stenotic or occluded, predisposing to subclavian steal syndrome warranting clinical correlation. The great vessels do not show significant narrowing. The pulmonary veins are normal in caliber. Mediastinum/Nodes: Small hiatal hernia. Mild generalized thickening of the thoracic esophagus consistent with esophagitis. Endoscopy may be indicated. Thyroid gland and axillary spaces are unremarkable. There is no intrathoracic adenopathy. The thoracic trachea and main bronchi are clear. Lungs/Pleura: There are trace pleural effusions. No pneumothorax. No pleural thickening. Bilateral bronchial thickening noted in the lower lobes, in the left lower lobe with scattered subsegmental bronchial plugging. There is mild posterior atelectasis in the lungs but no infiltrates or nodules. Upper Abdomen: Cirrhotic liver configuration. No splenomegaly. No acute upper abdominal findings. Musculoskeletal: There is mild anterior wedging of the T1 and T4 vertebral bodies with chronic appearance. At T12, a partially visible moderate to severe upper plate compression fracture is noted, age indeterminate but there is no adjacent soft tissue thickening or fluid. This was not present on a lumbar spine plain film study of 07/10/2020, which is the last file study which included this area. There is about 4 mm retropulsion of the posterosuperior T12 cortex  into the spinal canal, narrowing the AP thecal sac to 8 mm. I believe this is probably subacute or chronic based on the gross appearance. Review of the MIP images confirms the above findings. IMPRESSION: 1. Positive for large volume acute PE with CT evidence of right heart strain (RV/LV Ratio = 1.4 ) consistent with at least submassive (intermediate risk) PE. The presence of right heart strain has been associated with an increased risk of morbidity and mortality. Please refer to the "Code PE Focused" order set in EPIC. 2. Cardiomegaly with right chamber predominance. 3. Trace pleural effusions. 4. Bilateral lower lobe bronchitis with scattered subsegmental bronchial plugging left lower  lobe. 5. Aortic atherosclerosis. 6. 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 7. Cirrhotic liver configuration. 8. Small hiatal hernia with generalized thickening of the thoracic esophagus consistent with esophagitis. Endoscopy may be indicated. 9. T12 partially visible moderate to severe upper plate compression fracture, age indeterminate but I believe this is probably subacute or chronic. There is about 4 mm retropulsion of the posterosuperior cortex into the spinal canal narrowing the AP thecal sac to 8 mm. 10. Critical Value/emergent results were called by telephone at the time of interpretation on 01/10/2023 at 4:36 am to provider [facility] , who verbally acknowledged these results. Aortic Atherosclerosis (ICD10-I70.0). Electronically Signed   By: Almira Bar M.D.   On: 01/10/2023 04:47   CT Head Wo Contrast  Result Date: 01/10/2023 CLINICAL DATA:  Near syncopal episode EXAM: CT HEAD WITHOUT CONTRAST TECHNIQUE: Contiguous axial images were obtained from the base of the skull through the vertex without intravenous contrast. RADIATION DOSE REDUCTION: This exam was performed according to the departmental  dose-optimization program which includes automated exposure control, adjustment of the mA and/or kV according to patient size and/or use of iterative reconstruction technique. COMPARISON:  01/14/2022 FINDINGS: Brain: No evidence of acute infarction, hemorrhage, mass, mass effect, or midline shift. No hydrocephalus or extra-axial fluid collection. Periventricular white matter changes, likely the sequela of chronic small vessel ischemic disease. Cerebral atrophy with ex vacuo dilatation the ventricles. Vascular: No hyperdense vessel. Atherosclerotic calcifications in the intracranial carotid and vertebral arteries. Skull: Negative for fracture or focal lesion. Sinuses/Orbits: No acute finding. Status post bilateral lens replacements. Other: The mastoid air cells are well aerated. IMPRESSION: No acute intracranial process. Electronically Signed   By: Wiliam Ke M.D.   On: 01/10/2023 02:05   DG Chest Port 1 View  Result Date: 01/09/2023 CLINICAL DATA:  Weakness and fatigue. EXAM: PORTABLE CHEST 1 VIEW COMPARISON:  03/26/2021 FINDINGS: The heart is mildly enlarged. Unchanged mediastinal contours. Aortic atherosclerosis. Suspect small left pleural effusion. Streaky right infrahilar atelectasis. No pneumothorax. No pulmonary edema. IMPRESSION: Mild cardiomegaly. Suspect small left pleural effusion. Streaky right infrahilar atelectasis. Electronically Signed   By: Narda Rutherford M.D.   On: 01/09/2023 23:53        Scheduled Meds:  atorvastatin  20 mg Oral QHS   citalopram  20 mg Oral QPM   insulin aspart  0-9 Units Subcutaneous TID WC   melatonin  3 mg Oral QHS   memantine  10 mg Oral BID   Continuous Infusions:  heparin 750 Units/hr (01/11/23 0627)     LOS: 1 day    Time spent:    Azucena Fallen, DO [facility]  If 7PM-7AM, please contact night-coverage [email or web address]  01/11/2023, 7:44 AM

## 2023-01-11 NOTE — Progress Notes (Signed)
SATURATION QUALIFICATIONS: (This note is used to comply with regulatory documentation for home oxygen)  Patient Saturations on Room Air at Rest = 96%  Patient Saturations on Room Air while Ambulating 88% which increased to 91% after  a few steps

## 2023-01-11 NOTE — Discharge Instructions (Addendum)
Information on my medicine - ELIQUIS (apixaban)  This medication education was reviewed with me or my healthcare representative as part of my discharge preparation.  The pharmacist that spoke with me during my hospital stay was:  Ulyses Southward, RPH-CPP  Why was Eliquis prescribed for you? Eliquis was prescribed to treat blood clots that may have been found in the veins of your legs (deep vein thrombosis) or in your lungs (pulmonary embolism) and to reduce the risk of them occurring again.  What do You need to know about Eliquis ? The starting dose is 10 mg (two 5 mg tablets) taken TWICE daily for the FIRST SEVEN (7) DAYS, then on (enter date)  01/18/23  the dose is reduced to ONE 5 mg tablet taken TWICE daily.  Eliquis may be taken with or without food.   Try to take the dose about the same time in the morning and in the evening. If you have difficulty swallowing the tablet whole please discuss with your pharmacist how to take the medication safely.  Take Eliquis exactly as prescribed and DO NOT stop taking Eliquis without talking to the doctor who prescribed the medication.  Stopping may increase your risk of developing a new blood clot.  Refill your prescription before you run out.  After discharge, you should have regular check-up appointments with your healthcare provider that is prescribing your Eliquis.    What do you do if you miss a dose? If a dose of ELIQUIS is not taken at the scheduled time, take it as soon as possible on the same day and twice-daily administration should be resumed. The dose should not be doubled to make up for a missed dose.  Important Safety Information A possible side effect of Eliquis is bleeding. You should call your healthcare provider right away if you experience any of the following: Bleeding from an injury or your nose that does not stop. Unusual colored urine (red or dark brown) or unusual colored stools (red or black). Unusual bruising for unknown  reasons. A serious fall or if you hit your head (even if there is no bleeding).  Some medicines may interact with Eliquis and might increase your risk of bleeding or clotting while on Eliquis. To help avoid this, consult your healthcare provider or pharmacist prior to using any new prescription or non-prescription medications, including herbals, vitamins, non-steroidal anti-inflammatory drugs (NSAIDs) and supplements.  This website has more information on Eliquis (apixaban): http://www.eliquis.com/eliquis/home

## 2023-01-11 NOTE — TOC CM/SW Note (Signed)
Due to high risk for falls with decreased mobility due to limited activity tolerance, decreased balance and generalized weakness. Pt would benefit from a bedside commode that will help prevent and reduce risk from falls.

## 2023-01-12 DIAGNOSIS — R296 Repeated falls: Secondary | ICD-10-CM | POA: Diagnosis not present

## 2023-01-12 DIAGNOSIS — R413 Other amnesia: Secondary | ICD-10-CM | POA: Diagnosis not present

## 2023-01-12 DIAGNOSIS — I2609 Other pulmonary embolism with acute cor pulmonale: Secondary | ICD-10-CM | POA: Diagnosis not present

## 2023-01-12 DIAGNOSIS — D696 Thrombocytopenia, unspecified: Secondary | ICD-10-CM | POA: Diagnosis not present

## 2023-01-12 LAB — CBC
HCT: 38.9 % (ref 36.0–46.0)
Hemoglobin: 13.6 g/dL (ref 12.0–15.0)
MCH: 32.4 pg (ref 26.0–34.0)
MCHC: 35 g/dL (ref 30.0–36.0)
MCV: 92.6 fL (ref 80.0–100.0)
Platelets: 79 10*3/uL — ABNORMAL LOW (ref 150–400)
RBC: 4.2 MIL/uL (ref 3.87–5.11)
RDW: 11.8 % (ref 11.5–15.5)
WBC: 6.3 10*3/uL (ref 4.0–10.5)
nRBC: 0 % (ref 0.0–0.2)

## 2023-01-12 LAB — GLUCOSE, CAPILLARY
Glucose-Capillary: 203 mg/dL — ABNORMAL HIGH (ref 70–99)
Glucose-Capillary: 149 mg/dL — ABNORMAL HIGH (ref 70–99)

## 2023-01-12 MED ORDER — APIXABAN 5 MG PO TABS: ORAL_TABLET | ORAL | 0 refills | Status: AC

## 2023-01-12 NOTE — Progress Notes (Signed)
SATURATION QUALIFICATIONS: (This note is used to comply with regulatory documentation for home oxygen)  Patient Saturations on Room Air at Rest = 96 %  Patient Saturations on Room Air while Ambulating = 92 %  

## 2023-01-12 NOTE — Discharge Summary (Signed)
Physician Discharge Summary  Tiffany Burke UJW:[id number] DOB: [birth year] DOA: 01/09/2023  PCP: Merri Brunette, MD  Admit date: 01/09/2023 Discharge date: 01/12/2023  Admitted From: Home Disposition:  Home  Recommendations for Outpatient Follow-up:  Follow up with PCP in 1-2 weeks  Home Health: PT/OT/RN/Aide  Equipment/Devices: Transport chair  Discharge Condition:Stable  CODE STATUS: FULL  Diet recommendation:  As tolerated  Brief/Interim Summary: Tiffany Burke is a 82 y.o. female with medical history significant of frequent falls, HTN, HLD, carotid artery disease, depression/anxiety, T2DM, GERD, OSA on cpap who presented to ED after a fall.    Per family at bedside patient had notable episode of syncope versus near syncope, likely secondary to acute PE, submassive per imaging.  Patient's hemodynamics remained stable, PCCM initially consulted due to concern over size that patient may require thrombolytics regulation however she has remained markedly stable on heparin drip, DVT studies bilaterally are negative.  Echo without any acute or overt wall motion abnormalities, grade 1 diastolic dysfunction otherwise generally unremarkable, EF 60 to 65%.  PE likely provoked in the setting of poor ambulatory status, family indicates that patient walks very little and requires moderate assistance with movement.  At this time we will transition patient off heparin drip onto p.o. Eliquis for outpatient follow-up.  We did discuss the risks of anticoagulation in the setting of an elderly patient with high risk of falling.  At this time given patient's improving clinical status she is otherwise stable and agreeable for discharge home.  Discharge Diagnoses:  Principal Problem:   Acute pulmonary embolism (HCC) Active Problems:   Thrombocytopenia (HCC)   Subclavian artery disease (HCC)   T12 compression fracture (HCC)   Frequent falls   Controlled type 2 diabetes mellitus  without complication, without long-term current use of insulin (HCC)   Essential hypertension, benign   Depression with anxiety   Memory loss   Hypercholesteremia   OSA (obstructive sleep apnea)   Acute pulmonary embolism (HCC) -Submassive per imaging - hemodynamically stable - no indication for agressive measures/thrombolytics -Transition from heparin drip to Eliquis -DVT study negative bilaterally -echo without overt wall motion abnormalities or depressed EF -no family hx of clotting disorders, no recent travel, does not smoke. Recent covid vaccines in last 4 weeks but no recent illness   Thrombocytopenia (HCC) Secondary to PE/prolonged PTT Follow closely with repeat labs per PCP   Subclavian artery disease (HCC) CTA showing severely stenotic or occluded proximal left subclavian artery.  Follow-up with PCP, unclear chronicity, repeat imaging if symptomatic otherwise no indication for intervention at this time given her asymptomatic state.   T12 compression fracture (HCC), chronic Husband states this is not new. She had PT which wasn't very helpful She has no more pain regarding this.    Frequent falls, chronic ambulatory dysfunction Likely combination of weakness/memory loss Continue PT OT, recommend TED hose if orthostatics continue to be an issue, encouraged increased p.o. intake with free water.   Controlled type 2 diabetes mellitus without complication, without long-term current use of insulin (HCC) A1C 8.1   Essential hypertension, benign Soft bp in setting of submassive PE, hold losartan    Depression with anxiety Continue celexa daily    Memory loss Husband states she has no diagnosis of dementia.  She needs full assist with ADLs  ? Early dementia with frequent falls Continue namenda/delirium precautions    Hypercholesteremia Continue lipitor 20mg  daily    OSA (obstructive sleep apnea) No longer on CPAP. Husband state she hasn't needed  this in years    Discharge Instructions  Discharge Instructions     Face-to-face encounter (required for Medicare/Medicaid patients)   Complete by: As directed    I Azucena Fallen certify that this patient is under my care and that I, or a nurse practitioner or physician's assistant working with me, had a face-to-face encounter that meets the physician face-to-face encounter requirements with this patient on 01/12/2023. The encounter with the patient was in whole, or in part for the following medical condition(s) which is the primary reason for home health care (List medical condition): acute PE, ambualtory dysfunction, syncope with fall/collapse   The encounter with the patient was in whole, or in part, for the following medical condition, which is the primary reason for home health care: acute PE, ambualtory dysfunction, syncope with fall/collapse   I certify that, based on my findings, the following services are medically necessary home health services:  Nursing Physical therapy     Reason for Medically Necessary Home Health Services:  Skilled Nursing- Change/Decline in Patient Status Skilled Nursing- Skilled Assessment/Observation Skilled Nursing- Teaching of Disease Process/Symptom Management Therapy- Investment banker, operational, Patent examiner Therapy- Instruction on use of Assistive Device for Ambulation on all Surfaces Therapy- Instruction on Safe use of Assistive Devices for ADLs     My clinical findings support the need for the above services:  Unable to leave home safely without assistance and/or assistive device Cognitive impairments, dementia, or mental confusion  that make it unsafe to leave home     Further, I certify that my clinical findings support that this patient is homebound due to: Unable to leave home safely without assistance   Home Health   Complete by: As directed    To provide the following care/treatments:  PT OT Home Health Aide RN Respiratory Care         Allergies as of 01/12/2023       Reactions   Penicillins Anaphylaxis   Vancomycin Rash        Medication List     STOP taking these medications    losartan 25 MG tablet Commonly known as: COZAAR       TAKE these medications    acetaminophen 500 MG tablet Commonly known as: TYLENOL Take 1,000 mg by mouth every 6 (six) hours as needed for moderate pain (pain score 4-6).   apixaban 5 MG Tabs tablet Commonly known as: ELIQUIS Take 2 tablets (10 mg total) by mouth 2 (two) times daily for 6 days, THEN 1 tablet (5 mg total) 2 (two) times daily for 28 days. Start taking on: January 12, 2023   atorvastatin 20 MG tablet Commonly known as: LIPITOR Take 20 mg by mouth at bedtime.   citalopram 20 MG tablet Commonly known as: CELEXA Take 20 mg by mouth every evening.   D3-1000 PO Take 1,000 Units by mouth daily.   Farxiga 10 MG Tabs tablet Generic drug: dapagliflozin propanediol Take 10 mg by mouth daily.   MELATONIN PO Take 1 tablet by mouth at bedtime.   memantine 10 MG tablet Commonly known as: NAMENDA Take 1 tablet by mouth twice daily   nitroGLYCERIN 0.4 MG SL tablet Commonly known as: NITROSTAT Place 0.4 mg under the tongue every 5 (five) minutes as needed for chest pain.   PRESERVISION AREDS 2 PO Take 1 tablet by mouth in the morning and at bedtime.        Follow-up Information     [facility] Follow  up.   Specialty: Home Health Services Why: HHRN/PT/OT/aide- arranged- they will contact you to schedule post discharge Contact information: [address] [place] [address] [phone]         [facility] Follow up.   Why: (Adapt)- Bedside Commode and transport chair arranged- to be delivered to the home Contact information: [address] [place] [address] [phone]                Allergies  Allergen Reactions   Penicillins Anaphylaxis   Vancomycin Rash    Consultations: -  None  Procedures/Studies: VAS [place] LOWER EXTREMITY VENOUS (DVT)  Result Date: 01/11/2023  Lower Venous DVT Study Patient Name:  [facility]  Date of Exam:   01/10/2023 Medical Rec #: [id number]                   Accession #:    [id number] Date of Birth: September 01, 1940                   Patient Gender: F Patient Age:   82 years Exam Location:  [facility] Procedure:      VAS [place] LOWER EXTREMITY VENOUS (DVT) Referring Phys: Orland Mustard --------------------------------------------------------------------------------  Indications: Pulmonary embolism.  Limitations: Altered mental status and body habitus. Comparison Study: No prior study on file Performing Technologist: Sherren Kerns RVS  Examination Guidelines: A complete evaluation includes B-mode imaging, spectral Doppler, color Doppler, and power Doppler as needed of all accessible portions of each vessel. Bilateral testing is considered an integral part of a complete examination. Limited examinations for reoccurring indications may be performed as noted. The reflux portion of the exam is performed with the patient in reverse Trendelenburg.  +--------+---------------+---------+-----------+----------------+-------------+ RIGHT   CompressibilityPhasicitySpontaneityProperties      Thrombus                                                                 Aging         +--------+---------------+---------+-----------+----------------+-------------+ CFV     Full           Yes      No         pulsatile                                                                waveforms                     +--------+---------------+---------+-----------+----------------+-------------+ SFJ     Full                                                             +--------+---------------+---------+-----------+----------------+-------------+ FV Prox Full                                                              +--------+---------------+---------+-----------+----------------+-------------+  FV Mid  Full                                                             +--------+---------------+---------+-----------+----------------+-------------+ FV      Full                                                             Distal                                                                   +--------+---------------+---------+-----------+----------------+-------------+ PFV     Full                                                             +--------+---------------+---------+-----------+----------------+-------------+ POP     Full           Yes      Yes                                      +--------+---------------+---------+-----------+----------------+-------------+ PTV     Full                                                             +--------+---------------+---------+-----------+----------------+-------------+ PERO    Full                                                             +--------+---------------+---------+-----------+----------------+-------------+   +---------+---------------+---------+-----------+---------------+--------------+ LEFT     CompressibilityPhasicitySpontaneityProperties     Thrombus Aging +---------+---------------+---------+-----------+---------------+--------------+ CFV      Full           Yes      No         pulsatile                                                                 waveforms                     +---------+---------------+---------+-----------+---------------+--------------+ SFJ  Full                                                             +---------+---------------+---------+-----------+---------------+--------------+ FV Prox  Full                                                             +---------+---------------+---------+-----------+---------------+--------------+ FV Mid   Full                                                              +---------+---------------+---------+-----------+---------------+--------------+ FV DistalFull                                                             +---------+---------------+---------+-----------+---------------+--------------+ PFV      Full                                                             +---------+---------------+---------+-----------+---------------+--------------+ POP                     Yes      Yes                       patent by                                                                 color and                                                                 Doppler        +---------+---------------+---------+-----------+---------------+--------------+ PTV      Full                                                             +---------+---------------+---------+-----------+---------------+--------------+ PERO     Full                                                             +---------+---------------+---------+-----------+---------------+--------------+  Summary: BILATERAL: - No evidence of deep vein thrombosis seen in the lower extremities, bilaterally. -No evidence of popliteal cyst, bilaterally.   *See table(s) above for measurements and observations. Electronically signed by Gerarda Fraction on 01/11/2023 at 4:55:08 PM.    Final    ECHOCARDIOGRAM COMPLETE  Result Date: 01/11/2023    ECHOCARDIOGRAM REPORT   Patient Name:   [facility] Eblin Date of Exam: 01/11/2023 Medical Rec #:  [id number]                  Height:       62.0 in Accession #:    [id number]                 Weight:       164.5 lb Date of Birth:  1940-07-19                  BSA:          1.759 m Patient Age:    82 years                   BP:           103/70 mmHg Patient Gender: F                          HR:           70 bpm. Exam Location:  Inpatient Procedure: 2D Echo, Cardiac Doppler and  Color Doppler Indications:     Syncope R55  History:         Patient has prior history of Echocardiogram examinations, most                  recent 04/29/2021. CAD; Risk Factors:Diabetes and Hypertension.  Sonographer:     Harriette Bouillon RDCS Referring Phys:  [id number] Angie Fava Diagnosing Phys: Arvilla Meres MD IMPRESSIONS  1. Left ventricular ejection fraction, by estimation, is 60 to 65%. The left ventricle has normal function. The left ventricle has no regional wall motion abnormalities. Left ventricular diastolic parameters are consistent with Grade I diastolic dysfunction (impaired relaxation).  2. Right ventricular systolic function is normal. The right ventricular size is normal. There is normal pulmonary artery systolic pressure. The estimated right ventricular systolic pressure is 29.2 mmHg.  3. The mitral valve is normal in structure. No evidence of mitral valve regurgitation. No evidence of mitral stenosis.  4. Tricuspid valve regurgitation is moderate.  5. The aortic valve is tricuspid. There is mild calcification of the aortic valve. Aortic valve regurgitation is not visualized. No aortic stenosis is present.  6. The inferior vena cava is normal in size with <50% respiratory variability, suggesting right atrial pressure of 8 mmHg. FINDINGS  Left Ventricle: Left ventricular ejection fraction, by estimation, is 60 to 65%. The left ventricle has normal function. The left ventricle has no regional wall motion abnormalities. The left ventricular internal cavity size was normal in size. There is  no left ventricular hypertrophy. Left ventricular diastolic parameters are consistent with Grade I diastolic dysfunction (impaired relaxation). Right Ventricle: The right ventricular size is normal. No increase in right ventricular wall thickness. Right ventricular systolic function is normal. There is normal pulmonary artery systolic pressure. The tricuspid regurgitant velocity is 2.56 m/s, and  with an  assumed right atrial pressure of 3 mmHg, the estimated right ventricular systolic pressure is 29.2 mmHg. Left Atrium: Left atrial size was normal in size. Right Atrium:  Right atrial size was normal in size. Pericardium: There is no evidence of pericardial effusion. Mitral Valve: The mitral valve is normal in structure. No evidence of mitral valve regurgitation. No evidence of mitral valve stenosis. Tricuspid Valve: The tricuspid valve is normal in structure. Tricuspid valve regurgitation is moderate . No evidence of tricuspid stenosis. Aortic Valve: The aortic valve is tricuspid. There is mild calcification of the aortic valve. Aortic valve regurgitation is not visualized. No aortic stenosis is present. Pulmonic Valve: The pulmonic valve was normal in structure. Pulmonic valve regurgitation is trivial. No evidence of pulmonic stenosis. Aorta: The aortic root is normal in size and structure. Venous: The inferior vena cava is normal in size with less than 50% respiratory variability, suggesting right atrial pressure of 8 mmHg. IAS/Shunts: No atrial level shunt detected by color flow Doppler.  LEFT VENTRICLE PLAX 2D LVIDd:         4.00 cm   Diastology LVIDs:         3.40 cm   LV e' medial:    5.77 cm/s LV PW:         1.00 cm   LV E/e' medial:  10.0 LV IVS:        1.10 cm   LV e' lateral:   6.31 cm/s LVOT diam:     1.90 cm   LV E/e' lateral: 9.1 LV SV:         41 LV SV Index:   23 LVOT Area:     2.84 cm  RIGHT VENTRICLE             IVC RV S prime:     10.20 cm/s  IVC diam: 2.00 cm TAPSE (M-mode): 2.1 cm LEFT ATRIUM           Index LA diam:      2.60 cm 1.48 cm/m LA Vol (A4C): 29.4 ml 16.74 ml/m  AORTIC VALVE LVOT Vmax:   67.90 cm/s LVOT Vmean:  48.900 cm/s LVOT VTI:    0.144 m  AORTA Ao Root diam: 2.80 cm Ao Asc diam:  3.10 cm MITRAL VALVE               TRICUSPID VALVE MV Area (PHT): 3.76 cm    TR Peak grad:   26.2 mmHg MV Decel Time: 202 msec    TR Mean grad:   17.0 mmHg MV E velocity: 57.60 cm/s  TR Vmax:         256.00 cm/s MV A velocity: 85.90 cm/s  TR Vmean:       202.0 cm/s MV E/A ratio:  0.67                            SHUNTS                            Systemic VTI:  0.14 m                            Systemic Diam: 1.90 cm Arvilla Meres MD Electronically signed by Arvilla Meres MD Signature Date/Time: 01/11/2023/10:22:21 AM    Final (Updated)    CT Angio Chest PE W and/or Wo Contrast  Result Date: 01/10/2023 CLINICAL DATA:  Near syncopal episode, high probability of pulmonary embolism. EXAM: CT ANGIOGRAPHY CHEST WITH CONTRAST TECHNIQUE: Multidetector CT imaging of the chest was performed using the standard  protocol during bolus administration of intravenous contrast. Multiplanar CT image reconstructions and MIPs were obtained to evaluate the vascular anatomy. RADIATION DOSE REDUCTION: This exam was performed according to the departmental dose-optimization program which includes automated exposure control, adjustment of the mA and/or kV according to patient size and/or use of iterative reconstruction technique. CONTRAST:  75mL OMNIPAQUE IOHEXOL 350 MG/ML SOLN COMPARISON:  Portable chest yesterday, portable chest 03/26/2021. No prior chest CT. FINDINGS: Cardiovascular: In general there is a large arterial embolic clot burden affecting both lungs and associated with right heart strain with RV/LV ratio of 1.4. The pulmonary trunk is upper normal in caliber. There is a saddle thrombus straddling the division of the right and left main pulmonary arteries. On the right, acute clot nearly fills the distal main artery, with linear thrombus extending into the right upper lobe lobar and segmental arteries, with occlusive thrombus in the right lower lobe main artery extending into multiple segmental and subsegmental arteries. There is nonocclusive right middle lobe lobar and segmental arterial thrombus. On the left, clot nearly occludes the upper lobe apical segmental artery with extension into at least 1 subsegmental  branch, extends into the upper lobe anterior segmental artery and lingular segmental artery, and almost completely occludes the lower lobe main artery with extension into multiple basal segmental and subsegmental branches. There is mild-to-moderate cardiomegaly with a right chamber predominance. No visible coronary calcifications or substantial pericardial fluid. Moderate to heavy calcification noted in the aorta in the distal arch and descending segment, with aortic tortuosity without aneurysm or dissection. The great vessels branch normally. The proximal 1.8 cm of the left subclavian artery is heavily calcified and is either very severely stenotic or occluded, predisposing to subclavian steal syndrome warranting clinical correlation. The great vessels do not show significant narrowing. The pulmonary veins are normal in caliber. Mediastinum/Nodes: Small hiatal hernia. Mild generalized thickening of the thoracic esophagus consistent with esophagitis. Endoscopy may be indicated. Thyroid gland and axillary spaces are unremarkable. There is no intrathoracic adenopathy. The thoracic trachea and main bronchi are clear. Lungs/Pleura: There are trace pleural effusions. No pneumothorax. No pleural thickening. Bilateral bronchial thickening noted in the lower lobes, in the left lower lobe with scattered subsegmental bronchial plugging. There is mild posterior atelectasis in the lungs but no infiltrates or nodules. Upper Abdomen: Cirrhotic liver configuration. No splenomegaly. No acute upper abdominal findings. Musculoskeletal: There is mild anterior wedging of the T1 and T4 vertebral bodies with chronic appearance. At T12, a partially visible moderate to severe upper plate compression fracture is noted, age indeterminate but there is no adjacent soft tissue thickening or fluid. This was not present on a lumbar spine plain film study of 07/10/2020, which is the last file study which included this area. There is about 4 mm  retropulsion of the posterosuperior T12 cortex into the spinal canal, narrowing the AP thecal sac to 8 mm. I believe this is probably subacute or chronic based on the gross appearance. Review of the MIP images confirms the above findings. IMPRESSION: 1. Positive for large volume acute PE with CT evidence of right heart strain (RV/LV Ratio = 1.4 ) consistent with at least submassive (intermediate risk) PE. The presence of right heart strain has been associated with an increased risk of morbidity and mortality. Please refer to the "Code PE Focused" order set in EPIC. 2. Cardiomegaly with right chamber predominance. 3. Trace pleural effusions. 4. Bilateral lower lobe bronchitis with scattered subsegmental bronchial plugging left lower lobe. 5. Aortic atherosclerosis. 6.  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 7. Cirrhotic liver configuration. 8. Small hiatal hernia with generalized thickening of the thoracic esophagus consistent with esophagitis. Endoscopy may be indicated. 9. T12 partially visible moderate to severe upper plate compression fracture, age indeterminate but I believe this is probably subacute or chronic. There is about 4 mm retropulsion of the posterosuperior cortex into the spinal canal narrowing the AP thecal sac to 8 mm. 10. Critical Value/emergent results were called by telephone at the time of interpretation on 01/10/2023 at 4:36 am to provider [facility] , who verbally acknowledged these results. Aortic Atherosclerosis (ICD10-I70.0). Electronically Signed   By: Almira Bar M.D.   On: 01/10/2023 04:47   CT Head Wo Contrast  Result Date: 01/10/2023 CLINICAL DATA:  Near syncopal episode EXAM: CT HEAD WITHOUT CONTRAST TECHNIQUE: Contiguous axial images were obtained from the base of the skull through the vertex without intravenous contrast. RADIATION DOSE REDUCTION: This exam was performed  according to the departmental dose-optimization program which includes automated exposure control, adjustment of the mA and/or kV according to patient size and/or use of iterative reconstruction technique. COMPARISON:  01/14/2022 FINDINGS: Brain: No evidence of acute infarction, hemorrhage, mass, mass effect, or midline shift. No hydrocephalus or extra-axial fluid collection. Periventricular white matter changes, likely the sequela of chronic small vessel ischemic disease. Cerebral atrophy with ex vacuo dilatation the ventricles. Vascular: No hyperdense vessel. Atherosclerotic calcifications in the intracranial carotid and vertebral arteries. Skull: Negative for fracture or focal lesion. Sinuses/Orbits: No acute finding. Status post bilateral lens replacements. Other: The mastoid air cells are well aerated. IMPRESSION: No acute intracranial process. Electronically Signed   By: Wiliam Ke M.D.   On: 01/10/2023 02:05   DG Chest Port 1 View  Result Date: 01/09/2023 CLINICAL DATA:  Weakness and fatigue. EXAM: PORTABLE CHEST 1 VIEW COMPARISON:  03/26/2021 FINDINGS: The heart is mildly enlarged. Unchanged mediastinal contours. Aortic atherosclerosis. Suspect small left pleural effusion. Streaky right infrahilar atelectasis. No pneumothorax. No pulmonary edema. IMPRESSION: Mild cardiomegaly. Suspect small left pleural effusion. Streaky right infrahilar atelectasis. Electronically Signed   By: Narda Rutherford M.D.   On: 01/09/2023 23:53     Subjective: No acute issues/events overnight   Discharge Exam: Vitals:   01/12/23 0801 01/12/23 1116  BP: 98/74 114/69  Pulse: 61 78  Resp: 15 17  Temp: 97.6 F (36.4 C) 98.3 F (36.8 C)  SpO2: 94% 96%   Vitals:   01/12/23 0342 01/12/23 0437 01/12/23 0801 01/12/23 1116  BP: (!) 144/76  98/74 114/69  Pulse:   61 78  Resp:   15 17  Temp: 98.2 F (36.8 C)  97.6 F (36.4 C) 98.3 F (36.8 C)  TempSrc: Oral  Axillary Oral  SpO2:   94% 96%  Weight:  74.5 kg     Height:        General: Pt is alert, awake, not in acute distress Cardiovascular: RRR, S1/S2 +, no rubs, no gallops Respiratory: CTA bilaterally, no wheezing, no rhonchi Abdominal: Soft, NT, ND, bowel sounds + Extremities: no edema, no cyanosis   The results of significant diagnostics from this hospitalization (including imaging, microbiology, ancillary and laboratory) are listed below for reference.     Microbiology: No results found for this or any previous visit (from the past 240 hour(s)).   Labs: BNP (last 3 results) Recent Labs    01/10/23 0434  BNP 169.3*   Basic Metabolic Panel: Recent Labs  Lab 01/09/23 2330 01/10/23 0456 01/10/23 0956 01/11/23 0331  NA 139 138  --  138  K 4.4 4.3  --  3.9  CL 102 101  --  103  CO2 21* 24  --  24  GLUCOSE 243* 165*  --  146*  BUN 18 17  --  19  CREATININE 0.81 0.79  --  0.86  CALCIUM 10.1 10.0  --  9.5  MG  --  2.1 2.1  --    Liver Function Tests: Recent Labs  Lab 01/09/23 2330 01/10/23 0456  AST 39 27  ALT 50* 39  ALKPHOS 97 79  BILITOT 1.5* 1.7*  PROT 7.5 6.3*  ALBUMIN 4.2 3.4*   CBC: Recent Labs  Lab 01/09/23 2330 01/10/23 0434 01/11/23 0331 01/12/23 0503  WBC 8.9 6.7 5.9 6.3  NEUTROABS 6.8  --   --   --   HGB 15.5* 13.8 12.6 13.6  HCT 46.7* 40.2 38.2 38.9  MCV 95.5 93.1 96.0 92.6  PLT 80* 80* 72* 79*   CBG: Recent Labs  Lab 01/11/23 1250 01/11/23 1731 01/11/23 2032 01/12/23 0607 01/12/23 1117  GLUCAP 150* 134* 181* 203* 149*   Hgb A1c Recent Labs    01/10/23 0845  HGBA1C 8.1*   Urinalysis    Component Value Date/Time   COLORURINE YELLOW 01/09/2023 2330   APPEARANCEUR CLEAR 01/09/2023 2330   LABSPEC 1.021 01/09/2023 2330   PHURINE 5.0 01/09/2023 2330   GLUCOSEU >=500 (A) 01/09/2023 2330   HGBUR NEGATIVE 01/09/2023 2330   BILIRUBINUR NEGATIVE 01/09/2023 2330   KETONESUR 5 (A) 01/09/2023 2330   PROTEINUR NEGATIVE 01/09/2023 2330   UROBILINOGEN 0.2 03/20/2014 1422   NITRITE  NEGATIVE 01/09/2023 2330   LEUKOCYTESUR NEGATIVE 01/09/2023 2330   Sepsis Labs Recent Labs  Lab 01/09/23 2330 01/10/23 0434 01/11/23 0331 01/12/23 0503  WBC 8.9 6.7 5.9 6.3   Time coordinating discharge: Over 30 minutes  SIGNED:  Azucena Fallen, DO [facility] 01/12/2023, 6:07 PM Pager   If 7PM-7AM, please contact night-coverage [email or web address]

## 2023-01-12 NOTE — Progress Notes (Addendum)
Discharge instructions reviewed with pt and her husband.  Copy of instructions given to pt/husband, also informed script for Eliquis sent to her pharmacy for pick up. Hand out provided on Eliquis and PE.  Husband assisting pt to get dressed, pt needs assist with mobility and staff will assist taking out for discharge.  Pt to be d/c'd via wheelchair with belongings, with husband.           To be escorted by staff.   Juanna Pudlo,RN SWOT

## 2023-01-12 NOTE — Consult Note (Signed)
[facility]  [facility] Inpatient Consult   01/12/2023  [name] [facility] 10-22-1940 [id number]  [facility] [[facility]]  Accountable Care Organization [ACO] Patient: Tiffany Burke [facility] Medicare  Primary Care Provider: Merri Brunette, MD [facility] Provider at [facility] this provider is listed for the transition of care follow up appointments and calls   [facility] Liaison reviewed for post hospital follow up needs with [facility] provider   Plan: Care coordination/management needs are to be followed by [facility] Physician transition of care team.   [facility] Management/Population Health does not replace or interfere with any arrangements made by the Inpatient Transition of Care team.   For questions contact:   Charlesetta Shanks, RN, BSN, CCM [place]  [facility], [facility] Liaison Direct Dial: [phone] or secure chat Website: [name]         .

## 2023-01-12 NOTE — TOC Transition Note (Signed)
Transition of Care (TOC) - CM/SW Discharge Note Donn Pierini RN, BSN Transitions of Care Unit 4E- RN Case Manager See Treatment Team for direct phone #   Patient Details  Name: Tiffany Burke MRN: [id number] Date of Birth: 08/03/1940  Transition of Care [facility]) CM/SW Contact:  Darrold Span, RN Phone Number: 01/12/2023, 11:01 AM   Clinical Narrative:    Pt stable for transition home today, HH has been set up with [facility] for HHRN/PT/OT/aide needs.   DME- arranged w/ [facility] for [facility] and transport chair.  Per liaison- spouse has declined BSC this am, transport Chair has been set up to be delivered to home tomorrow.   Pt new to Eliquis- per benefits check- copay $45, 30 day free trial coupon provided to spouse to use at [facility] for initial fill today.   Spouse at bedside to transport home. No further TOC needs noted.    Final next level of care: Home w Home Health Services Barriers to Discharge: No Barriers Identified   Patient Goals and CMS Choice CMS Medicare.gov Compare Post Acute Care list provided to:: Patient Choice offered to / list presented to : Patient, Spouse, Adult Children  Discharge Placement                 Home w/ [facility]        Discharge Plan and Services Additional resources added to the After Visit Summary for     Discharge Planning Services: CM Consult Post Acute Care Choice: Durable Medical Equipment, Home Health          DME Arranged: Bedside commode, Other see comment (transport chair) DME Agency: [facility] Date DME Agency Contacted: 01/12/23 Time DME Agency Contacted: 0930 Representative spoke with at DME Agency: Zack HH Arranged: RN, PT, OT, Nurse's Aide HH Agency: [facility] Date [facility] Agency Contacted: 01/11/23 Time HH Agency Contacted: 1656 Representative spoke with at [facility] Agency: Kandee Keen  Social Determinants of Health (SDOH) Interventions SDOH Screenings   Food Insecurity: No Food Insecurity (01/10/2023)   Housing: Low Risk  (01/10/2023)  Transportation Needs: No Transportation Needs (01/10/2023)  Utilities: Not At Risk (01/10/2023)  Tobacco Use: Medium Risk (01/09/2023)     Readmission Risk Interventions    01/12/2023   11:01 AM  Readmission Risk Prevention Plan  Post Dischage Appt Complete  Medication Screening Complete  Transportation Screening Complete

## 2023-01-13 DIAGNOSIS — R531 Weakness: Secondary | ICD-10-CM | POA: Diagnosis not present

## 2023-01-13 DIAGNOSIS — S22080A Wedge compression fracture of T11-T12 vertebra, initial encounter for closed fracture: Secondary | ICD-10-CM | POA: Diagnosis not present

## 2023-01-13 DIAGNOSIS — R296 Repeated falls: Secondary | ICD-10-CM | POA: Diagnosis not present

## 2023-01-15 DIAGNOSIS — D696 Thrombocytopenia, unspecified: Secondary | ICD-10-CM | POA: Diagnosis not present

## 2023-01-15 DIAGNOSIS — G4733 Obstructive sleep apnea (adult) (pediatric): Secondary | ICD-10-CM | POA: Diagnosis not present

## 2023-01-15 DIAGNOSIS — Z9181 History of falling: Secondary | ICD-10-CM | POA: Diagnosis not present

## 2023-01-15 DIAGNOSIS — Z7901 Long term (current) use of anticoagulants: Secondary | ICD-10-CM | POA: Diagnosis not present

## 2023-01-15 DIAGNOSIS — Z87891 Personal history of nicotine dependence: Secondary | ICD-10-CM | POA: Diagnosis not present

## 2023-01-15 DIAGNOSIS — G5600 Carpal tunnel syndrome, unspecified upper limb: Secondary | ICD-10-CM | POA: Diagnosis not present

## 2023-01-15 DIAGNOSIS — I6523 Occlusion and stenosis of bilateral carotid arteries: Secondary | ICD-10-CM | POA: Diagnosis not present

## 2023-01-15 DIAGNOSIS — K219 Gastro-esophageal reflux disease without esophagitis: Secondary | ICD-10-CM | POA: Diagnosis not present

## 2023-01-15 DIAGNOSIS — I119 Hypertensive heart disease without heart failure: Secondary | ICD-10-CM | POA: Diagnosis not present

## 2023-01-15 DIAGNOSIS — I2699 Other pulmonary embolism without acute cor pulmonale: Secondary | ICD-10-CM | POA: Diagnosis not present

## 2023-01-15 DIAGNOSIS — Z87442 Personal history of urinary calculi: Secondary | ICD-10-CM | POA: Diagnosis not present

## 2023-01-15 DIAGNOSIS — Z7984 Long term (current) use of oral hypoglycemic drugs: Secondary | ICD-10-CM | POA: Diagnosis not present

## 2023-01-15 DIAGNOSIS — F429 Obsessive-compulsive disorder, unspecified: Secondary | ICD-10-CM | POA: Diagnosis not present

## 2023-01-15 DIAGNOSIS — F418 Other specified anxiety disorders: Secondary | ICD-10-CM | POA: Diagnosis not present

## 2023-01-15 DIAGNOSIS — I452 Bifascicular block: Secondary | ICD-10-CM | POA: Diagnosis not present

## 2023-01-15 DIAGNOSIS — M4854XD Collapsed vertebra, not elsewhere classified, thoracic region, subsequent encounter for fracture with routine healing: Secondary | ICD-10-CM | POA: Diagnosis not present

## 2023-01-15 DIAGNOSIS — Z860100 Personal history of colon polyps, unspecified: Secondary | ICD-10-CM | POA: Diagnosis not present

## 2023-01-15 DIAGNOSIS — Z96653 Presence of artificial knee joint, bilateral: Secondary | ICD-10-CM | POA: Diagnosis not present

## 2023-01-15 DIAGNOSIS — I700 Atherosclerosis of aorta: Secondary | ICD-10-CM | POA: Diagnosis not present

## 2023-01-15 DIAGNOSIS — E119 Type 2 diabetes mellitus without complications: Secondary | ICD-10-CM | POA: Diagnosis not present

## 2023-01-15 DIAGNOSIS — E7800 Pure hypercholesterolemia, unspecified: Secondary | ICD-10-CM | POA: Diagnosis not present

## 2023-01-15 DIAGNOSIS — K449 Diaphragmatic hernia without obstruction or gangrene: Secondary | ICD-10-CM | POA: Diagnosis not present

## 2023-01-15 DIAGNOSIS — J90 Pleural effusion, not elsewhere classified: Secondary | ICD-10-CM | POA: Diagnosis not present

## 2023-01-15 DIAGNOSIS — J9811 Atelectasis: Secondary | ICD-10-CM | POA: Diagnosis not present

## 2023-01-18 DIAGNOSIS — I119 Hypertensive heart disease without heart failure: Secondary | ICD-10-CM | POA: Diagnosis not present

## 2023-01-18 DIAGNOSIS — D696 Thrombocytopenia, unspecified: Secondary | ICD-10-CM | POA: Diagnosis not present

## 2023-01-18 DIAGNOSIS — I700 Atherosclerosis of aorta: Secondary | ICD-10-CM | POA: Diagnosis not present

## 2023-01-18 DIAGNOSIS — I2699 Other pulmonary embolism without acute cor pulmonale: Secondary | ICD-10-CM | POA: Diagnosis not present

## 2023-01-18 DIAGNOSIS — I6523 Occlusion and stenosis of bilateral carotid arteries: Secondary | ICD-10-CM | POA: Diagnosis not present

## 2023-01-18 DIAGNOSIS — K449 Diaphragmatic hernia without obstruction or gangrene: Secondary | ICD-10-CM | POA: Diagnosis not present

## 2023-01-18 DIAGNOSIS — I452 Bifascicular block: Secondary | ICD-10-CM | POA: Diagnosis not present

## 2023-01-18 DIAGNOSIS — F418 Other specified anxiety disorders: Secondary | ICD-10-CM | POA: Diagnosis not present

## 2023-01-18 DIAGNOSIS — J90 Pleural effusion, not elsewhere classified: Secondary | ICD-10-CM | POA: Diagnosis not present

## 2023-01-18 DIAGNOSIS — J9811 Atelectasis: Secondary | ICD-10-CM | POA: Diagnosis not present

## 2023-01-18 DIAGNOSIS — M4854XD Collapsed vertebra, not elsewhere classified, thoracic region, subsequent encounter for fracture with routine healing: Secondary | ICD-10-CM | POA: Diagnosis not present

## 2023-01-18 DIAGNOSIS — F429 Obsessive-compulsive disorder, unspecified: Secondary | ICD-10-CM | POA: Diagnosis not present

## 2023-01-18 DIAGNOSIS — G4733 Obstructive sleep apnea (adult) (pediatric): Secondary | ICD-10-CM | POA: Diagnosis not present

## 2023-01-18 DIAGNOSIS — E7800 Pure hypercholesterolemia, unspecified: Secondary | ICD-10-CM | POA: Diagnosis not present

## 2023-01-18 DIAGNOSIS — G5600 Carpal tunnel syndrome, unspecified upper limb: Secondary | ICD-10-CM | POA: Diagnosis not present

## 2023-01-18 DIAGNOSIS — E119 Type 2 diabetes mellitus without complications: Secondary | ICD-10-CM | POA: Diagnosis not present

## 2023-01-19 DIAGNOSIS — I700 Atherosclerosis of aorta: Secondary | ICD-10-CM | POA: Diagnosis not present

## 2023-01-19 DIAGNOSIS — G5600 Carpal tunnel syndrome, unspecified upper limb: Secondary | ICD-10-CM | POA: Diagnosis not present

## 2023-01-19 DIAGNOSIS — I119 Hypertensive heart disease without heart failure: Secondary | ICD-10-CM | POA: Diagnosis not present

## 2023-01-19 DIAGNOSIS — D696 Thrombocytopenia, unspecified: Secondary | ICD-10-CM | POA: Diagnosis not present

## 2023-01-19 DIAGNOSIS — E7800 Pure hypercholesterolemia, unspecified: Secondary | ICD-10-CM | POA: Diagnosis not present

## 2023-01-19 DIAGNOSIS — F429 Obsessive-compulsive disorder, unspecified: Secondary | ICD-10-CM | POA: Diagnosis not present

## 2023-01-19 DIAGNOSIS — I2699 Other pulmonary embolism without acute cor pulmonale: Secondary | ICD-10-CM | POA: Diagnosis not present

## 2023-01-19 DIAGNOSIS — K449 Diaphragmatic hernia without obstruction or gangrene: Secondary | ICD-10-CM | POA: Diagnosis not present

## 2023-01-19 DIAGNOSIS — F418 Other specified anxiety disorders: Secondary | ICD-10-CM | POA: Diagnosis not present

## 2023-01-19 DIAGNOSIS — G4733 Obstructive sleep apnea (adult) (pediatric): Secondary | ICD-10-CM | POA: Diagnosis not present

## 2023-01-19 DIAGNOSIS — J9811 Atelectasis: Secondary | ICD-10-CM | POA: Diagnosis not present

## 2023-01-19 DIAGNOSIS — E119 Type 2 diabetes mellitus without complications: Secondary | ICD-10-CM | POA: Diagnosis not present

## 2023-01-19 DIAGNOSIS — M4854XD Collapsed vertebra, not elsewhere classified, thoracic region, subsequent encounter for fracture with routine healing: Secondary | ICD-10-CM | POA: Diagnosis not present

## 2023-01-19 DIAGNOSIS — I452 Bifascicular block: Secondary | ICD-10-CM | POA: Diagnosis not present

## 2023-01-19 DIAGNOSIS — I6523 Occlusion and stenosis of bilateral carotid arteries: Secondary | ICD-10-CM | POA: Diagnosis not present

## 2023-01-19 DIAGNOSIS — J90 Pleural effusion, not elsewhere classified: Secondary | ICD-10-CM | POA: Diagnosis not present

## 2023-01-20 DIAGNOSIS — F418 Other specified anxiety disorders: Secondary | ICD-10-CM | POA: Diagnosis not present

## 2023-01-20 DIAGNOSIS — I2699 Other pulmonary embolism without acute cor pulmonale: Secondary | ICD-10-CM | POA: Diagnosis not present

## 2023-01-20 DIAGNOSIS — J90 Pleural effusion, not elsewhere classified: Secondary | ICD-10-CM | POA: Diagnosis not present

## 2023-01-20 DIAGNOSIS — D696 Thrombocytopenia, unspecified: Secondary | ICD-10-CM | POA: Diagnosis not present

## 2023-01-20 DIAGNOSIS — K449 Diaphragmatic hernia without obstruction or gangrene: Secondary | ICD-10-CM | POA: Diagnosis not present

## 2023-01-20 DIAGNOSIS — I6523 Occlusion and stenosis of bilateral carotid arteries: Secondary | ICD-10-CM | POA: Diagnosis not present

## 2023-01-20 DIAGNOSIS — I700 Atherosclerosis of aorta: Secondary | ICD-10-CM | POA: Diagnosis not present

## 2023-01-20 DIAGNOSIS — G5600 Carpal tunnel syndrome, unspecified upper limb: Secondary | ICD-10-CM | POA: Diagnosis not present

## 2023-01-20 DIAGNOSIS — M4854XD Collapsed vertebra, not elsewhere classified, thoracic region, subsequent encounter for fracture with routine healing: Secondary | ICD-10-CM | POA: Diagnosis not present

## 2023-01-20 DIAGNOSIS — E7800 Pure hypercholesterolemia, unspecified: Secondary | ICD-10-CM | POA: Diagnosis not present

## 2023-01-20 DIAGNOSIS — F429 Obsessive-compulsive disorder, unspecified: Secondary | ICD-10-CM | POA: Diagnosis not present

## 2023-01-20 DIAGNOSIS — G4733 Obstructive sleep apnea (adult) (pediatric): Secondary | ICD-10-CM | POA: Diagnosis not present

## 2023-01-20 DIAGNOSIS — I452 Bifascicular block: Secondary | ICD-10-CM | POA: Diagnosis not present

## 2023-01-20 DIAGNOSIS — J9811 Atelectasis: Secondary | ICD-10-CM | POA: Diagnosis not present

## 2023-01-20 DIAGNOSIS — E119 Type 2 diabetes mellitus without complications: Secondary | ICD-10-CM | POA: Diagnosis not present

## 2023-01-20 DIAGNOSIS — I119 Hypertensive heart disease without heart failure: Secondary | ICD-10-CM | POA: Diagnosis not present

## 2023-01-22 DIAGNOSIS — M4854XD Collapsed vertebra, not elsewhere classified, thoracic region, subsequent encounter for fracture with routine healing: Secondary | ICD-10-CM | POA: Diagnosis not present

## 2023-01-22 DIAGNOSIS — I119 Hypertensive heart disease without heart failure: Secondary | ICD-10-CM | POA: Diagnosis not present

## 2023-01-22 DIAGNOSIS — G5600 Carpal tunnel syndrome, unspecified upper limb: Secondary | ICD-10-CM | POA: Diagnosis not present

## 2023-01-22 DIAGNOSIS — F418 Other specified anxiety disorders: Secondary | ICD-10-CM | POA: Diagnosis not present

## 2023-01-22 DIAGNOSIS — E7800 Pure hypercholesterolemia, unspecified: Secondary | ICD-10-CM | POA: Diagnosis not present

## 2023-01-22 DIAGNOSIS — J90 Pleural effusion, not elsewhere classified: Secondary | ICD-10-CM | POA: Diagnosis not present

## 2023-01-22 DIAGNOSIS — E119 Type 2 diabetes mellitus without complications: Secondary | ICD-10-CM | POA: Diagnosis not present

## 2023-01-22 DIAGNOSIS — D696 Thrombocytopenia, unspecified: Secondary | ICD-10-CM | POA: Diagnosis not present

## 2023-01-22 DIAGNOSIS — K449 Diaphragmatic hernia without obstruction or gangrene: Secondary | ICD-10-CM | POA: Diagnosis not present

## 2023-01-22 DIAGNOSIS — G4733 Obstructive sleep apnea (adult) (pediatric): Secondary | ICD-10-CM | POA: Diagnosis not present

## 2023-01-22 DIAGNOSIS — I2699 Other pulmonary embolism without acute cor pulmonale: Secondary | ICD-10-CM | POA: Diagnosis not present

## 2023-01-22 DIAGNOSIS — J9811 Atelectasis: Secondary | ICD-10-CM | POA: Diagnosis not present

## 2023-01-22 DIAGNOSIS — F429 Obsessive-compulsive disorder, unspecified: Secondary | ICD-10-CM | POA: Diagnosis not present

## 2023-01-22 DIAGNOSIS — I452 Bifascicular block: Secondary | ICD-10-CM | POA: Diagnosis not present

## 2023-01-22 DIAGNOSIS — I6523 Occlusion and stenosis of bilateral carotid arteries: Secondary | ICD-10-CM | POA: Diagnosis not present

## 2023-01-22 DIAGNOSIS — I700 Atherosclerosis of aorta: Secondary | ICD-10-CM | POA: Diagnosis not present

## 2023-01-26 DIAGNOSIS — G5600 Carpal tunnel syndrome, unspecified upper limb: Secondary | ICD-10-CM | POA: Diagnosis not present

## 2023-01-26 DIAGNOSIS — K449 Diaphragmatic hernia without obstruction or gangrene: Secondary | ICD-10-CM | POA: Diagnosis not present

## 2023-01-26 DIAGNOSIS — J90 Pleural effusion, not elsewhere classified: Secondary | ICD-10-CM | POA: Diagnosis not present

## 2023-01-26 DIAGNOSIS — F429 Obsessive-compulsive disorder, unspecified: Secondary | ICD-10-CM | POA: Diagnosis not present

## 2023-01-26 DIAGNOSIS — I452 Bifascicular block: Secondary | ICD-10-CM | POA: Diagnosis not present

## 2023-01-26 DIAGNOSIS — D696 Thrombocytopenia, unspecified: Secondary | ICD-10-CM | POA: Diagnosis not present

## 2023-01-26 DIAGNOSIS — J9811 Atelectasis: Secondary | ICD-10-CM | POA: Diagnosis not present

## 2023-01-26 DIAGNOSIS — E7800 Pure hypercholesterolemia, unspecified: Secondary | ICD-10-CM | POA: Diagnosis not present

## 2023-01-26 DIAGNOSIS — G4733 Obstructive sleep apnea (adult) (pediatric): Secondary | ICD-10-CM | POA: Diagnosis not present

## 2023-01-26 DIAGNOSIS — F418 Other specified anxiety disorders: Secondary | ICD-10-CM | POA: Diagnosis not present

## 2023-01-26 DIAGNOSIS — I700 Atherosclerosis of aorta: Secondary | ICD-10-CM | POA: Diagnosis not present

## 2023-01-26 DIAGNOSIS — M4854XD Collapsed vertebra, not elsewhere classified, thoracic region, subsequent encounter for fracture with routine healing: Secondary | ICD-10-CM | POA: Diagnosis not present

## 2023-01-26 DIAGNOSIS — E119 Type 2 diabetes mellitus without complications: Secondary | ICD-10-CM | POA: Diagnosis not present

## 2023-01-26 DIAGNOSIS — I2699 Other pulmonary embolism without acute cor pulmonale: Secondary | ICD-10-CM | POA: Diagnosis not present

## 2023-01-26 DIAGNOSIS — I119 Hypertensive heart disease without heart failure: Secondary | ICD-10-CM | POA: Diagnosis not present

## 2023-01-26 DIAGNOSIS — I6523 Occlusion and stenosis of bilateral carotid arteries: Secondary | ICD-10-CM | POA: Diagnosis not present

## 2023-01-27 DIAGNOSIS — I6523 Occlusion and stenosis of bilateral carotid arteries: Secondary | ICD-10-CM | POA: Diagnosis not present

## 2023-01-27 DIAGNOSIS — I452 Bifascicular block: Secondary | ICD-10-CM | POA: Diagnosis not present

## 2023-01-27 DIAGNOSIS — E7800 Pure hypercholesterolemia, unspecified: Secondary | ICD-10-CM | POA: Diagnosis not present

## 2023-01-27 DIAGNOSIS — K449 Diaphragmatic hernia without obstruction or gangrene: Secondary | ICD-10-CM | POA: Diagnosis not present

## 2023-01-27 DIAGNOSIS — J90 Pleural effusion, not elsewhere classified: Secondary | ICD-10-CM | POA: Diagnosis not present

## 2023-01-27 DIAGNOSIS — I2699 Other pulmonary embolism without acute cor pulmonale: Secondary | ICD-10-CM | POA: Diagnosis not present

## 2023-01-27 DIAGNOSIS — M4854XD Collapsed vertebra, not elsewhere classified, thoracic region, subsequent encounter for fracture with routine healing: Secondary | ICD-10-CM | POA: Diagnosis not present

## 2023-01-27 DIAGNOSIS — D696 Thrombocytopenia, unspecified: Secondary | ICD-10-CM | POA: Diagnosis not present

## 2023-01-27 DIAGNOSIS — J9811 Atelectasis: Secondary | ICD-10-CM | POA: Diagnosis not present

## 2023-01-27 DIAGNOSIS — G5600 Carpal tunnel syndrome, unspecified upper limb: Secondary | ICD-10-CM | POA: Diagnosis not present

## 2023-01-27 DIAGNOSIS — I119 Hypertensive heart disease without heart failure: Secondary | ICD-10-CM | POA: Diagnosis not present

## 2023-01-27 DIAGNOSIS — I700 Atherosclerosis of aorta: Secondary | ICD-10-CM | POA: Diagnosis not present

## 2023-01-27 DIAGNOSIS — G4733 Obstructive sleep apnea (adult) (pediatric): Secondary | ICD-10-CM | POA: Diagnosis not present

## 2023-01-27 DIAGNOSIS — E119 Type 2 diabetes mellitus without complications: Secondary | ICD-10-CM | POA: Diagnosis not present

## 2023-01-27 DIAGNOSIS — F418 Other specified anxiety disorders: Secondary | ICD-10-CM | POA: Diagnosis not present

## 2023-01-27 DIAGNOSIS — F429 Obsessive-compulsive disorder, unspecified: Secondary | ICD-10-CM | POA: Diagnosis not present

## 2023-01-28 DIAGNOSIS — F03B0 Unspecified dementia, moderate, without behavioral disturbance, psychotic disturbance, mood disturbance, and anxiety: Secondary | ICD-10-CM | POA: Diagnosis not present

## 2023-01-28 DIAGNOSIS — I771 Stricture of artery: Secondary | ICD-10-CM | POA: Diagnosis not present

## 2023-01-28 DIAGNOSIS — I2692 Saddle embolus of pulmonary artery without acute cor pulmonale: Secondary | ICD-10-CM | POA: Diagnosis not present

## 2023-01-28 DIAGNOSIS — D696 Thrombocytopenia, unspecified: Secondary | ICD-10-CM | POA: Diagnosis not present

## 2023-01-29 DIAGNOSIS — G5600 Carpal tunnel syndrome, unspecified upper limb: Secondary | ICD-10-CM | POA: Diagnosis not present

## 2023-01-29 DIAGNOSIS — I119 Hypertensive heart disease without heart failure: Secondary | ICD-10-CM | POA: Diagnosis not present

## 2023-01-29 DIAGNOSIS — E119 Type 2 diabetes mellitus without complications: Secondary | ICD-10-CM | POA: Diagnosis not present

## 2023-01-29 DIAGNOSIS — F418 Other specified anxiety disorders: Secondary | ICD-10-CM | POA: Diagnosis not present

## 2023-01-29 DIAGNOSIS — D696 Thrombocytopenia, unspecified: Secondary | ICD-10-CM | POA: Diagnosis not present

## 2023-01-29 DIAGNOSIS — F429 Obsessive-compulsive disorder, unspecified: Secondary | ICD-10-CM | POA: Diagnosis not present

## 2023-01-29 DIAGNOSIS — I700 Atherosclerosis of aorta: Secondary | ICD-10-CM | POA: Diagnosis not present

## 2023-01-29 DIAGNOSIS — I2699 Other pulmonary embolism without acute cor pulmonale: Secondary | ICD-10-CM | POA: Diagnosis not present

## 2023-01-29 DIAGNOSIS — I6523 Occlusion and stenosis of bilateral carotid arteries: Secondary | ICD-10-CM | POA: Diagnosis not present

## 2023-01-29 DIAGNOSIS — I452 Bifascicular block: Secondary | ICD-10-CM | POA: Diagnosis not present

## 2023-01-29 DIAGNOSIS — M4854XD Collapsed vertebra, not elsewhere classified, thoracic region, subsequent encounter for fracture with routine healing: Secondary | ICD-10-CM | POA: Diagnosis not present

## 2023-01-29 DIAGNOSIS — K449 Diaphragmatic hernia without obstruction or gangrene: Secondary | ICD-10-CM | POA: Diagnosis not present

## 2023-01-29 DIAGNOSIS — E7800 Pure hypercholesterolemia, unspecified: Secondary | ICD-10-CM | POA: Diagnosis not present

## 2023-01-29 DIAGNOSIS — G4733 Obstructive sleep apnea (adult) (pediatric): Secondary | ICD-10-CM | POA: Diagnosis not present

## 2023-01-29 DIAGNOSIS — J90 Pleural effusion, not elsewhere classified: Secondary | ICD-10-CM | POA: Diagnosis not present

## 2023-01-29 DIAGNOSIS — J9811 Atelectasis: Secondary | ICD-10-CM | POA: Diagnosis not present

## 2023-02-01 DIAGNOSIS — F429 Obsessive-compulsive disorder, unspecified: Secondary | ICD-10-CM | POA: Diagnosis not present

## 2023-02-01 DIAGNOSIS — I700 Atherosclerosis of aorta: Secondary | ICD-10-CM | POA: Diagnosis not present

## 2023-02-01 DIAGNOSIS — E119 Type 2 diabetes mellitus without complications: Secondary | ICD-10-CM | POA: Diagnosis not present

## 2023-02-01 DIAGNOSIS — G5600 Carpal tunnel syndrome, unspecified upper limb: Secondary | ICD-10-CM | POA: Diagnosis not present

## 2023-02-01 DIAGNOSIS — I452 Bifascicular block: Secondary | ICD-10-CM | POA: Diagnosis not present

## 2023-02-01 DIAGNOSIS — M4854XD Collapsed vertebra, not elsewhere classified, thoracic region, subsequent encounter for fracture with routine healing: Secondary | ICD-10-CM | POA: Diagnosis not present

## 2023-02-01 DIAGNOSIS — I2699 Other pulmonary embolism without acute cor pulmonale: Secondary | ICD-10-CM | POA: Diagnosis not present

## 2023-02-01 DIAGNOSIS — J90 Pleural effusion, not elsewhere classified: Secondary | ICD-10-CM | POA: Diagnosis not present

## 2023-02-01 DIAGNOSIS — D696 Thrombocytopenia, unspecified: Secondary | ICD-10-CM | POA: Diagnosis not present

## 2023-02-01 DIAGNOSIS — F418 Other specified anxiety disorders: Secondary | ICD-10-CM | POA: Diagnosis not present

## 2023-02-01 DIAGNOSIS — G4733 Obstructive sleep apnea (adult) (pediatric): Secondary | ICD-10-CM | POA: Diagnosis not present

## 2023-02-01 DIAGNOSIS — E7800 Pure hypercholesterolemia, unspecified: Secondary | ICD-10-CM | POA: Diagnosis not present

## 2023-02-01 DIAGNOSIS — I119 Hypertensive heart disease without heart failure: Secondary | ICD-10-CM | POA: Diagnosis not present

## 2023-02-01 DIAGNOSIS — K449 Diaphragmatic hernia without obstruction or gangrene: Secondary | ICD-10-CM | POA: Diagnosis not present

## 2023-02-01 DIAGNOSIS — J9811 Atelectasis: Secondary | ICD-10-CM | POA: Diagnosis not present

## 2023-02-01 DIAGNOSIS — I6523 Occlusion and stenosis of bilateral carotid arteries: Secondary | ICD-10-CM | POA: Diagnosis not present

## 2023-02-03 DIAGNOSIS — E119 Type 2 diabetes mellitus without complications: Secondary | ICD-10-CM | POA: Diagnosis not present

## 2023-02-03 DIAGNOSIS — G5600 Carpal tunnel syndrome, unspecified upper limb: Secondary | ICD-10-CM | POA: Diagnosis not present

## 2023-02-03 DIAGNOSIS — I700 Atherosclerosis of aorta: Secondary | ICD-10-CM | POA: Diagnosis not present

## 2023-02-03 DIAGNOSIS — I452 Bifascicular block: Secondary | ICD-10-CM | POA: Diagnosis not present

## 2023-02-03 DIAGNOSIS — F418 Other specified anxiety disorders: Secondary | ICD-10-CM | POA: Diagnosis not present

## 2023-02-03 DIAGNOSIS — D696 Thrombocytopenia, unspecified: Secondary | ICD-10-CM | POA: Diagnosis not present

## 2023-02-03 DIAGNOSIS — F429 Obsessive-compulsive disorder, unspecified: Secondary | ICD-10-CM | POA: Diagnosis not present

## 2023-02-03 DIAGNOSIS — J9811 Atelectasis: Secondary | ICD-10-CM | POA: Diagnosis not present

## 2023-02-03 DIAGNOSIS — K449 Diaphragmatic hernia without obstruction or gangrene: Secondary | ICD-10-CM | POA: Diagnosis not present

## 2023-02-03 DIAGNOSIS — I6523 Occlusion and stenosis of bilateral carotid arteries: Secondary | ICD-10-CM | POA: Diagnosis not present

## 2023-02-03 DIAGNOSIS — G4733 Obstructive sleep apnea (adult) (pediatric): Secondary | ICD-10-CM | POA: Diagnosis not present

## 2023-02-03 DIAGNOSIS — J90 Pleural effusion, not elsewhere classified: Secondary | ICD-10-CM | POA: Diagnosis not present

## 2023-02-03 DIAGNOSIS — I119 Hypertensive heart disease without heart failure: Secondary | ICD-10-CM | POA: Diagnosis not present

## 2023-02-03 DIAGNOSIS — M4854XD Collapsed vertebra, not elsewhere classified, thoracic region, subsequent encounter for fracture with routine healing: Secondary | ICD-10-CM | POA: Diagnosis not present

## 2023-02-03 DIAGNOSIS — I2699 Other pulmonary embolism without acute cor pulmonale: Secondary | ICD-10-CM | POA: Diagnosis not present

## 2023-02-03 DIAGNOSIS — E7800 Pure hypercholesterolemia, unspecified: Secondary | ICD-10-CM | POA: Diagnosis not present

## 2023-02-08 DIAGNOSIS — F429 Obsessive-compulsive disorder, unspecified: Secondary | ICD-10-CM | POA: Diagnosis not present

## 2023-02-08 DIAGNOSIS — E119 Type 2 diabetes mellitus without complications: Secondary | ICD-10-CM | POA: Diagnosis not present

## 2023-02-08 DIAGNOSIS — K449 Diaphragmatic hernia without obstruction or gangrene: Secondary | ICD-10-CM | POA: Diagnosis not present

## 2023-02-08 DIAGNOSIS — I119 Hypertensive heart disease without heart failure: Secondary | ICD-10-CM | POA: Diagnosis not present

## 2023-02-08 DIAGNOSIS — M4854XD Collapsed vertebra, not elsewhere classified, thoracic region, subsequent encounter for fracture with routine healing: Secondary | ICD-10-CM | POA: Diagnosis not present

## 2023-02-08 DIAGNOSIS — E7800 Pure hypercholesterolemia, unspecified: Secondary | ICD-10-CM | POA: Diagnosis not present

## 2023-02-08 DIAGNOSIS — I6523 Occlusion and stenosis of bilateral carotid arteries: Secondary | ICD-10-CM | POA: Diagnosis not present

## 2023-02-08 DIAGNOSIS — J90 Pleural effusion, not elsewhere classified: Secondary | ICD-10-CM | POA: Diagnosis not present

## 2023-02-08 DIAGNOSIS — I2699 Other pulmonary embolism without acute cor pulmonale: Secondary | ICD-10-CM | POA: Diagnosis not present

## 2023-02-08 DIAGNOSIS — F418 Other specified anxiety disorders: Secondary | ICD-10-CM | POA: Diagnosis not present

## 2023-02-08 DIAGNOSIS — I452 Bifascicular block: Secondary | ICD-10-CM | POA: Diagnosis not present

## 2023-02-08 DIAGNOSIS — G4733 Obstructive sleep apnea (adult) (pediatric): Secondary | ICD-10-CM | POA: Diagnosis not present

## 2023-02-08 DIAGNOSIS — I700 Atherosclerosis of aorta: Secondary | ICD-10-CM | POA: Diagnosis not present

## 2023-02-08 DIAGNOSIS — G5600 Carpal tunnel syndrome, unspecified upper limb: Secondary | ICD-10-CM | POA: Diagnosis not present

## 2023-02-08 DIAGNOSIS — D696 Thrombocytopenia, unspecified: Secondary | ICD-10-CM | POA: Diagnosis not present

## 2023-02-08 DIAGNOSIS — J9811 Atelectasis: Secondary | ICD-10-CM | POA: Diagnosis not present

## 2023-02-15 DIAGNOSIS — Z87442 Personal history of urinary calculi: Secondary | ICD-10-CM | POA: Diagnosis not present

## 2023-02-15 DIAGNOSIS — G4733 Obstructive sleep apnea (adult) (pediatric): Secondary | ICD-10-CM | POA: Diagnosis not present

## 2023-02-15 DIAGNOSIS — F429 Obsessive-compulsive disorder, unspecified: Secondary | ICD-10-CM | POA: Diagnosis not present

## 2023-02-15 DIAGNOSIS — J90 Pleural effusion, not elsewhere classified: Secondary | ICD-10-CM | POA: Diagnosis not present

## 2023-02-15 DIAGNOSIS — I452 Bifascicular block: Secondary | ICD-10-CM | POA: Diagnosis not present

## 2023-02-15 DIAGNOSIS — Z87891 Personal history of nicotine dependence: Secondary | ICD-10-CM | POA: Diagnosis not present

## 2023-02-15 DIAGNOSIS — Z7901 Long term (current) use of anticoagulants: Secondary | ICD-10-CM | POA: Diagnosis not present

## 2023-02-15 DIAGNOSIS — Z9181 History of falling: Secondary | ICD-10-CM | POA: Diagnosis not present

## 2023-02-15 DIAGNOSIS — Z96653 Presence of artificial knee joint, bilateral: Secondary | ICD-10-CM | POA: Diagnosis not present

## 2023-02-15 DIAGNOSIS — I6523 Occlusion and stenosis of bilateral carotid arteries: Secondary | ICD-10-CM | POA: Diagnosis not present

## 2023-02-15 DIAGNOSIS — Z860100 Personal history of colon polyps, unspecified: Secondary | ICD-10-CM | POA: Diagnosis not present

## 2023-02-15 DIAGNOSIS — D696 Thrombocytopenia, unspecified: Secondary | ICD-10-CM | POA: Diagnosis not present

## 2023-02-15 DIAGNOSIS — F418 Other specified anxiety disorders: Secondary | ICD-10-CM | POA: Diagnosis not present

## 2023-02-15 DIAGNOSIS — I700 Atherosclerosis of aorta: Secondary | ICD-10-CM | POA: Diagnosis not present

## 2023-02-15 DIAGNOSIS — E119 Type 2 diabetes mellitus without complications: Secondary | ICD-10-CM | POA: Diagnosis not present

## 2023-02-15 DIAGNOSIS — Z7984 Long term (current) use of oral hypoglycemic drugs: Secondary | ICD-10-CM | POA: Diagnosis not present

## 2023-02-15 DIAGNOSIS — I2699 Other pulmonary embolism without acute cor pulmonale: Secondary | ICD-10-CM | POA: Diagnosis not present

## 2023-02-15 DIAGNOSIS — I119 Hypertensive heart disease without heart failure: Secondary | ICD-10-CM | POA: Diagnosis not present

## 2023-02-15 DIAGNOSIS — J9811 Atelectasis: Secondary | ICD-10-CM | POA: Diagnosis not present

## 2023-02-15 DIAGNOSIS — E7800 Pure hypercholesterolemia, unspecified: Secondary | ICD-10-CM | POA: Diagnosis not present

## 2023-02-15 DIAGNOSIS — M4854XD Collapsed vertebra, not elsewhere classified, thoracic region, subsequent encounter for fracture with routine healing: Secondary | ICD-10-CM | POA: Diagnosis not present

## 2023-02-15 DIAGNOSIS — G5600 Carpal tunnel syndrome, unspecified upper limb: Secondary | ICD-10-CM | POA: Diagnosis not present

## 2023-02-15 DIAGNOSIS — K449 Diaphragmatic hernia without obstruction or gangrene: Secondary | ICD-10-CM | POA: Diagnosis not present

## 2023-02-15 DIAGNOSIS — K219 Gastro-esophageal reflux disease without esophagitis: Secondary | ICD-10-CM | POA: Diagnosis not present

## 2023-02-22 DIAGNOSIS — I452 Bifascicular block: Secondary | ICD-10-CM | POA: Diagnosis not present

## 2023-02-22 DIAGNOSIS — E7800 Pure hypercholesterolemia, unspecified: Secondary | ICD-10-CM | POA: Diagnosis not present

## 2023-02-22 DIAGNOSIS — G5600 Carpal tunnel syndrome, unspecified upper limb: Secondary | ICD-10-CM | POA: Diagnosis not present

## 2023-02-22 DIAGNOSIS — I2699 Other pulmonary embolism without acute cor pulmonale: Secondary | ICD-10-CM | POA: Diagnosis not present

## 2023-02-22 DIAGNOSIS — D696 Thrombocytopenia, unspecified: Secondary | ICD-10-CM | POA: Diagnosis not present

## 2023-02-22 DIAGNOSIS — M4854XD Collapsed vertebra, not elsewhere classified, thoracic region, subsequent encounter for fracture with routine healing: Secondary | ICD-10-CM | POA: Diagnosis not present

## 2023-02-22 DIAGNOSIS — I119 Hypertensive heart disease without heart failure: Secondary | ICD-10-CM | POA: Diagnosis not present

## 2023-02-22 DIAGNOSIS — I6523 Occlusion and stenosis of bilateral carotid arteries: Secondary | ICD-10-CM | POA: Diagnosis not present

## 2023-02-22 DIAGNOSIS — G4733 Obstructive sleep apnea (adult) (pediatric): Secondary | ICD-10-CM | POA: Diagnosis not present

## 2023-02-22 DIAGNOSIS — I700 Atherosclerosis of aorta: Secondary | ICD-10-CM | POA: Diagnosis not present

## 2023-02-22 DIAGNOSIS — J9811 Atelectasis: Secondary | ICD-10-CM | POA: Diagnosis not present

## 2023-02-22 DIAGNOSIS — K449 Diaphragmatic hernia without obstruction or gangrene: Secondary | ICD-10-CM | POA: Diagnosis not present

## 2023-02-22 DIAGNOSIS — F429 Obsessive-compulsive disorder, unspecified: Secondary | ICD-10-CM | POA: Diagnosis not present

## 2023-02-22 DIAGNOSIS — E119 Type 2 diabetes mellitus without complications: Secondary | ICD-10-CM | POA: Diagnosis not present

## 2023-02-22 DIAGNOSIS — F418 Other specified anxiety disorders: Secondary | ICD-10-CM | POA: Diagnosis not present

## 2023-02-22 DIAGNOSIS — J90 Pleural effusion, not elsewhere classified: Secondary | ICD-10-CM | POA: Diagnosis not present

## 2023-03-03 ENCOUNTER — Ambulatory Visit: Payer: Medicare Other | Attending: Cardiovascular Disease | Admitting: Cardiovascular Disease

## 2023-03-03 ENCOUNTER — Encounter: Payer: Self-pay | Admitting: Cardiovascular Disease

## 2023-03-03 VITALS — BP 118/54 | HR 84 | Ht 62.0 in | Wt 160.0 lb

## 2023-03-03 DIAGNOSIS — I779 Disorder of arteries and arterioles, unspecified: Secondary | ICD-10-CM

## 2023-03-03 DIAGNOSIS — I10 Essential (primary) hypertension: Secondary | ICD-10-CM

## 2023-03-03 DIAGNOSIS — I2699 Other pulmonary embolism without acute cor pulmonale: Secondary | ICD-10-CM | POA: Diagnosis not present

## 2023-03-03 DIAGNOSIS — I739 Peripheral vascular disease, unspecified: Secondary | ICD-10-CM

## 2023-03-03 DIAGNOSIS — E7800 Pure hypercholesterolemia, unspecified: Secondary | ICD-10-CM

## 2023-03-03 NOTE — Assessment & Plan Note (Signed)
CTA performed in the setting of syncope 01/10/2023 revealed a large volume pulm embolus and incidentally noted left subclavian artery occlusion versus high-grade stenosis.  The patient has had dizzy episodes with falls which has been attributed to lack of balance.  She does walk with a walker and is minimally ambulatory in her home.  At this point, given her age, moderate dementia and other comorbidities I do not feel inclined to pursue a interventional approach.

## 2023-03-03 NOTE — Assessment & Plan Note (Signed)
Patient had an episode of syncope while at home and subsequent documentation of a large volume acute PE.  She was discharged home on Eliquis.  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

## 2023-03-03 NOTE — Progress Notes (Signed)
03/03/2023 Kristene Greenan Ribble   Nov 18, 1940  [id number]  Primary Physician Merri Brunette, MD Primary Cardiologist: Runell Gess MD Nicholes Calamity, [place]  HPI:  Tiffany Burke is a 82 y.o. mildly overweight married Caucasian female mother of 2 children, grandmother of 4 grandchildren who is accompanied by her husband Dimas Aguas today.  She was referred by Dr. Merri Brunette for evaluation of a left subclavian artery stenosis.  She does have a history of hyperlipidemia.  She has never had a heart attack or stroke.  She does have moderately advanced dementia.  She also has experienced multiple falls in the past and walks with a walker at home and is really minimally ambulatory and does not get out much.  The etiology of her falls has been unclear.  She had an episode of witnessed syncope with her husband 01/10/2023.  A CTA revealed large volume pulmonary embolus but also noted a left subclavian artery stenosis raising the possibility of subclavian steal.  The patient does not have an audible blood pressure in her left arm today.  She denies claudication or pain in that extremity.  It certainly possible that she has subclavian steal physiology but given her age, moderate dementia and minimal ambulation at home I am hesitant to recommend an invasive approach at this time.   Current Meds  Medication Sig   acetaminophen (TYLENOL) 500 MG tablet Take 1,000 mg by mouth every 6 (six) hours as needed for moderate pain (pain score 4-6).   atorvastatin (LIPITOR) 20 MG tablet Take 20 mg by mouth at bedtime.   Cholecalciferol (D3-1000 PO) Take 1,000 Units by mouth daily.   citalopram (CELEXA) 20 MG tablet Take 20 mg by mouth every evening.   FARXIGA 10 MG TABS tablet Take 10 mg by mouth daily.   MELATONIN PO Take 1 tablet by mouth at bedtime.   memantine (NAMENDA) 10 MG tablet Take 1 tablet by mouth twice daily   Multiple Vitamins-Minerals (PRESERVISION AREDS 2 PO) Take 1 tablet by  mouth in the morning and at bedtime.   nitroGLYCERIN (NITROSTAT) 0.4 MG SL tablet Place 0.4 mg under the tongue every 5 (five) minutes as needed for chest pain.     Allergies  Allergen Reactions   Penicillins Anaphylaxis   Vancomycin Rash    Social History   Socioeconomic History   Marital status: Married    Spouse name: Dimas Aguas   Number of children: 2   Years of education: Not on file   Highest education level: Some college, no degree  Occupational History    Comment: retired Geologist, engineering, [place]  Tobacco Use   Smoking status: Former    Current packs/day: 0.00    Average packs/day: 0.5 packs/day for 32.0 years (16.0 ttl pk-yrs)    Types: Cigarettes    Start date: 03/23/1960    Quit date: 03/23/1992    Years since quitting: 30.9   Smokeless tobacco: Never  Substance and Sexual Activity   Alcohol use: Not Currently    Comment: none   Drug use: No   Sexual activity: Not on file  Other Topics Concern   Not on file  Social History Narrative   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     Lives in [place] with husband.   Right handed    No caffeine    Social Determinants of Health   Financial Resource Strain: Not on file  Food Insecurity: No Food Insecurity (01/10/2023)   Hunger Vital  Sign    Worried About Programme researcher, broadcasting/film/video in the Last Year: Never true    Ran Out of Food in the Last Year: Never true  Transportation Needs: No Transportation Needs (01/10/2023)   PRAPARE - Administrator, Civil Service (Medical): No    Lack of Transportation (Non-Medical): No  Physical Activity: Not on file  Stress: Not on file  Social Connections: Not on file  Intimate Partner Violence: Patient Unable To Answer (01/10/2023)   Humiliation, Afraid, Rape, and Kick questionnaire    Fear of Current or Ex-Partner: Patient unable to answer    Emotionally Abused: Patient unable to answer    Physically Abused: Patient unable to answer    Sexually  Abused: Patient unable to answer     Review of Systems: General: negative for chills, fever, night sweats or weight changes.  Cardiovascular: negative for chest pain, dyspnea on exertion, edema, orthopnea, palpitations, paroxysmal nocturnal dyspnea or shortness of breath Dermatological: negative for rash Respiratory: negative for cough or wheezing Urologic: negative for hematuria Abdominal: negative for nausea, vomiting, diarrhea, bright red blood per rectum, melena, or hematemesis Neurologic: negative for visual changes, syncope, or dizziness All other systems reviewed and are otherwise negative except as noted above.    Blood pressure 138/84, pulse 84, height 5\' 2"  (1.575 m), weight 160 lb (72.6 kg), SpO2 95%.  General appearance: alert and no distress Neck: no adenopathy, no carotid bruit, no JVD, supple, symmetrical, trachea midline, and thyroid not enlarged, symmetric, no tenderness/mass/nodules Lungs: clear to auscultation bilaterally Heart: regular rate and rhythm, S1, S2 normal, no murmur, click, rub or gallop Extremities: extremities normal, atraumatic, no cyanosis or edema Pulses: 2+ and symmetric Skin: Skin color, texture, turgor normal. No rashes or lesions Neurologic: Grossly normal  EKG not performed today.      ASSESSMENT AND PLAN:   Hypercholesteremia History of hyperlipidemia on statin therapy lipid profile performed 11/02/2022 revealing total cholesterol 163, LDL 61 and HDL of 51.  Essential hypertension, benign History of essential pretension with blood pressure measured today of 138/84.  She is not on antihypertensive medications.  Acute pulmonary embolism [facility]) Patient had an episode of syncope while at home and subsequent documentation of a large volume acute PE.  She was discharged home on Eliquis.  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  Subclavian artery disease (HCC) CTA performed in the setting of  syncope 01/10/2023 revealed a large volume pulm embolus and incidentally noted left subclavian artery occlusion versus high-grade stenosis.  The patient has had dizzy episodes with falls which has been attributed to lack of balance.  She does walk with a walker and is minimally ambulatory in her home.  At this point, given her age, moderate dementia and other comorbidities I do not feel inclined to pursue a interventional approach.     Runell Gess MD FACP,FACC,FAHA, [facility] 03/03/2023 2:46 PM

## 2023-03-03 NOTE — Assessment & Plan Note (Signed)
History of hyperlipidemia on statin therapy lipid profile performed 11/02/2022 revealing total cholesterol 163, LDL 61 and HDL of 51.

## 2023-03-03 NOTE — Patient Instructions (Signed)
Medication Instructions:  Your physician recommends that you continue on your current medications as directed. Please refer to the Current Medication list given to you today.  *If you need a refill on your cardiac medications before your next appointment, please call your pharmacy*   Testing/Procedures: Your physician has requested that you have a carotid duplex. This test is an ultrasound of the carotid arteries in your neck. It looks at blood flow through these arteries that supply the brain with blood. Allow one hour for this exam. There are no restrictions or special instructions. This will take place at [address] [facility], [address].  Please note: We ask at that you not bring children with you during ultrasound (echo/ vascular) testing. Due to room size and safety concerns, children are not allowed in the ultrasound rooms during exams. Our front office staff cannot provide observation of children in our lobby area while testing is being conducted. An adult accompanying a patient to their appointment will only be allowed in the ultrasound room at the discretion of the ultrasound technician under special circumstances. We apologize for any inconvenience.    Follow-Up: At [facility], you and your health needs are our priority.  As part of our continuing mission to provide you with exceptional heart care, we have created designated Provider Care Teams.  These Care Teams include your primary Cardiologist (physician) and Advanced Practice Providers (APPs -  Physician Assistants and Nurse Practitioners) who all work together to provide you with the care you need, when you need it.  We recommend signing up for the patient portal called "MyChart".  Sign up information is provided on this After Visit Summary.  MyChart is used to connect with patients for Virtual Visits (Telemedicine).  Patients are able to view lab/test results, encounter notes, upcoming appointments, etc.  Non-urgent messages  can be sent to your provider as well.   To learn more about what you can do with MyChart, go to [email or web address].    Your next appointment:   12 month(s)  Provider:   Nanetta Batty, MD

## 2023-03-03 NOTE — Assessment & Plan Note (Signed)
History of essential pretension with blood pressure measured today of 138/84.  She is not on antihypertensive medications.

## 2023-03-04 DIAGNOSIS — K5730 Diverticulosis of large intestine without perforation or abscess without bleeding: Secondary | ICD-10-CM | POA: Diagnosis not present

## 2023-03-04 DIAGNOSIS — G4733 Obstructive sleep apnea (adult) (pediatric): Secondary | ICD-10-CM | POA: Diagnosis not present

## 2023-03-04 DIAGNOSIS — I6529 Occlusion and stenosis of unspecified carotid artery: Secondary | ICD-10-CM | POA: Diagnosis not present

## 2023-03-04 DIAGNOSIS — D696 Thrombocytopenia, unspecified: Secondary | ICD-10-CM | POA: Diagnosis not present

## 2023-03-04 DIAGNOSIS — I771 Stricture of artery: Secondary | ICD-10-CM | POA: Diagnosis not present

## 2023-03-04 DIAGNOSIS — D692 Other nonthrombocytopenic purpura: Secondary | ICD-10-CM | POA: Diagnosis not present

## 2023-03-04 DIAGNOSIS — E119 Type 2 diabetes mellitus without complications: Secondary | ICD-10-CM | POA: Diagnosis not present

## 2023-03-04 DIAGNOSIS — E559 Vitamin D deficiency, unspecified: Secondary | ICD-10-CM | POA: Diagnosis not present

## 2023-03-04 DIAGNOSIS — M1990 Unspecified osteoarthritis, unspecified site: Secondary | ICD-10-CM | POA: Diagnosis not present

## 2023-03-04 DIAGNOSIS — K219 Gastro-esophageal reflux disease without esophagitis: Secondary | ICD-10-CM | POA: Diagnosis not present

## 2023-03-04 DIAGNOSIS — F03B3 Unspecified dementia, moderate, with mood disturbance: Secondary | ICD-10-CM | POA: Diagnosis not present

## 2023-03-04 DIAGNOSIS — F03B4 Unspecified dementia, moderate, with anxiety: Secondary | ICD-10-CM | POA: Diagnosis not present

## 2023-03-04 DIAGNOSIS — F32A Depression, unspecified: Secondary | ICD-10-CM | POA: Diagnosis not present

## 2023-03-04 DIAGNOSIS — I2602 Saddle embolus of pulmonary artery with acute cor pulmonale: Secondary | ICD-10-CM | POA: Diagnosis not present

## 2023-03-04 DIAGNOSIS — E785 Hyperlipidemia, unspecified: Secondary | ICD-10-CM | POA: Diagnosis not present

## 2023-03-04 DIAGNOSIS — I10 Essential (primary) hypertension: Secondary | ICD-10-CM | POA: Diagnosis not present

## 2023-03-09 DIAGNOSIS — D692 Other nonthrombocytopenic purpura: Secondary | ICD-10-CM | POA: Diagnosis not present

## 2023-03-09 DIAGNOSIS — G4733 Obstructive sleep apnea (adult) (pediatric): Secondary | ICD-10-CM | POA: Diagnosis not present

## 2023-03-09 DIAGNOSIS — I6529 Occlusion and stenosis of unspecified carotid artery: Secondary | ICD-10-CM | POA: Diagnosis not present

## 2023-03-09 DIAGNOSIS — K5730 Diverticulosis of large intestine without perforation or abscess without bleeding: Secondary | ICD-10-CM | POA: Diagnosis not present

## 2023-03-09 DIAGNOSIS — K219 Gastro-esophageal reflux disease without esophagitis: Secondary | ICD-10-CM | POA: Diagnosis not present

## 2023-03-09 DIAGNOSIS — I10 Essential (primary) hypertension: Secondary | ICD-10-CM | POA: Diagnosis not present

## 2023-03-09 DIAGNOSIS — E559 Vitamin D deficiency, unspecified: Secondary | ICD-10-CM | POA: Diagnosis not present

## 2023-03-09 DIAGNOSIS — I2602 Saddle embolus of pulmonary artery with acute cor pulmonale: Secondary | ICD-10-CM | POA: Diagnosis not present

## 2023-03-09 DIAGNOSIS — I771 Stricture of artery: Secondary | ICD-10-CM | POA: Diagnosis not present

## 2023-03-09 DIAGNOSIS — E119 Type 2 diabetes mellitus without complications: Secondary | ICD-10-CM | POA: Diagnosis not present

## 2023-03-09 DIAGNOSIS — M1990 Unspecified osteoarthritis, unspecified site: Secondary | ICD-10-CM | POA: Diagnosis not present

## 2023-03-09 DIAGNOSIS — F32A Depression, unspecified: Secondary | ICD-10-CM | POA: Diagnosis not present

## 2023-03-09 DIAGNOSIS — F03B3 Unspecified dementia, moderate, with mood disturbance: Secondary | ICD-10-CM | POA: Diagnosis not present

## 2023-03-09 DIAGNOSIS — F03B4 Unspecified dementia, moderate, with anxiety: Secondary | ICD-10-CM | POA: Diagnosis not present

## 2023-03-09 DIAGNOSIS — D696 Thrombocytopenia, unspecified: Secondary | ICD-10-CM | POA: Diagnosis not present

## 2023-03-09 DIAGNOSIS — E785 Hyperlipidemia, unspecified: Secondary | ICD-10-CM | POA: Diagnosis not present

## 2023-03-15 DIAGNOSIS — I2602 Saddle embolus of pulmonary artery with acute cor pulmonale: Secondary | ICD-10-CM | POA: Diagnosis not present

## 2023-03-15 DIAGNOSIS — E559 Vitamin D deficiency, unspecified: Secondary | ICD-10-CM | POA: Diagnosis not present

## 2023-03-15 DIAGNOSIS — F32A Depression, unspecified: Secondary | ICD-10-CM | POA: Diagnosis not present

## 2023-03-15 DIAGNOSIS — E785 Hyperlipidemia, unspecified: Secondary | ICD-10-CM | POA: Diagnosis not present

## 2023-03-15 DIAGNOSIS — I10 Essential (primary) hypertension: Secondary | ICD-10-CM | POA: Diagnosis not present

## 2023-03-15 DIAGNOSIS — G4733 Obstructive sleep apnea (adult) (pediatric): Secondary | ICD-10-CM | POA: Diagnosis not present

## 2023-03-15 DIAGNOSIS — D696 Thrombocytopenia, unspecified: Secondary | ICD-10-CM | POA: Diagnosis not present

## 2023-03-15 DIAGNOSIS — I771 Stricture of artery: Secondary | ICD-10-CM | POA: Diagnosis not present

## 2023-03-15 DIAGNOSIS — D692 Other nonthrombocytopenic purpura: Secondary | ICD-10-CM | POA: Diagnosis not present

## 2023-03-15 DIAGNOSIS — M1990 Unspecified osteoarthritis, unspecified site: Secondary | ICD-10-CM | POA: Diagnosis not present

## 2023-03-15 DIAGNOSIS — F03B3 Unspecified dementia, moderate, with mood disturbance: Secondary | ICD-10-CM | POA: Diagnosis not present

## 2023-03-15 DIAGNOSIS — K219 Gastro-esophageal reflux disease without esophagitis: Secondary | ICD-10-CM | POA: Diagnosis not present

## 2023-03-15 DIAGNOSIS — K5730 Diverticulosis of large intestine without perforation or abscess without bleeding: Secondary | ICD-10-CM | POA: Diagnosis not present

## 2023-03-15 DIAGNOSIS — E119 Type 2 diabetes mellitus without complications: Secondary | ICD-10-CM | POA: Diagnosis not present

## 2023-03-15 DIAGNOSIS — I6529 Occlusion and stenosis of unspecified carotid artery: Secondary | ICD-10-CM | POA: Diagnosis not present

## 2023-03-15 DIAGNOSIS — F03B4 Unspecified dementia, moderate, with anxiety: Secondary | ICD-10-CM | POA: Diagnosis not present

## 2023-03-31 ENCOUNTER — Ambulatory Visit (HOSPITAL_COMMUNITY)
Admission: RE | Admit: 2023-03-31 | Discharge: 2023-03-31 | Disposition: A | Discharge status: Home / Self Care | Payer: Medicare Other | Source: Ambulatory Visit | Attending: Cardiovascular Disease | Admitting: Cardiovascular Disease

## 2023-03-31 DIAGNOSIS — R55 Syncope and collapse: Secondary | ICD-10-CM | POA: Insufficient documentation

## 2023-03-31 DIAGNOSIS — I739 Peripheral vascular disease, unspecified: Secondary | ICD-10-CM | POA: Diagnosis not present

## 2023-03-31 DIAGNOSIS — I779 Disorder of arteries and arterioles, unspecified: Secondary | ICD-10-CM | POA: Diagnosis not present

## 2023-04-27 DIAGNOSIS — H524 Presbyopia: Secondary | ICD-10-CM | POA: Diagnosis not present

## 2023-05-06 DIAGNOSIS — E785 Hyperlipidemia, unspecified: Secondary | ICD-10-CM | POA: Diagnosis not present

## 2023-05-06 DIAGNOSIS — E1169 Type 2 diabetes mellitus with other specified complication: Secondary | ICD-10-CM | POA: Diagnosis not present

## 2023-05-06 DIAGNOSIS — E669 Obesity, unspecified: Secondary | ICD-10-CM | POA: Diagnosis not present

## 2023-05-06 DIAGNOSIS — Z23 Encounter for immunization: Secondary | ICD-10-CM | POA: Diagnosis not present

## 2023-05-06 DIAGNOSIS — F419 Anxiety disorder, unspecified: Secondary | ICD-10-CM | POA: Diagnosis not present

## 2023-07-21 DIAGNOSIS — M1990 Unspecified osteoarthritis, unspecified site: Secondary | ICD-10-CM | POA: Diagnosis not present

## 2023-07-21 DIAGNOSIS — G309 Alzheimer's disease, unspecified: Secondary | ICD-10-CM | POA: Diagnosis not present

## 2023-08-25 DIAGNOSIS — K080 Exfoliation of teeth due to systemic causes: Secondary | ICD-10-CM | POA: Diagnosis not present

## 2023-10-25 DIAGNOSIS — H353121 Nonexudative age-related macular degeneration, left eye, early dry stage: Secondary | ICD-10-CM | POA: Diagnosis not present

## 2023-11-04 DIAGNOSIS — E669 Obesity, unspecified: Secondary | ICD-10-CM | POA: Diagnosis not present

## 2023-11-04 DIAGNOSIS — E1169 Type 2 diabetes mellitus with other specified complication: Secondary | ICD-10-CM | POA: Diagnosis not present

## 2023-11-04 DIAGNOSIS — E559 Vitamin D deficiency, unspecified: Secondary | ICD-10-CM | POA: Diagnosis not present

## 2023-11-04 DIAGNOSIS — Z0000 Encounter for general adult medical examination without abnormal findings: Secondary | ICD-10-CM | POA: Diagnosis not present

## 2023-11-04 DIAGNOSIS — E785 Hyperlipidemia, unspecified: Secondary | ICD-10-CM | POA: Diagnosis not present

## 2023-11-04 DIAGNOSIS — Z1331 Encounter for screening for depression: Secondary | ICD-10-CM | POA: Diagnosis not present
